# Patient Record
Sex: Male | Born: 1970
Health system: Southern US, Community
[De-identification: ages and names within clinical notes are randomized; demographics above are authoritative.]

## PROBLEM LIST (undated history)

## (undated) DIAGNOSIS — G4700 Insomnia, unspecified: Secondary | ICD-10-CM

## (undated) DIAGNOSIS — J309 Allergic rhinitis, unspecified: Secondary | ICD-10-CM

## (undated) DIAGNOSIS — R030 Elevated blood-pressure reading, without diagnosis of hypertension: Secondary | ICD-10-CM

## (undated) DIAGNOSIS — Z9189 Other specified personal risk factors, not elsewhere classified: Secondary | ICD-10-CM

## (undated) DIAGNOSIS — F32A Depression, unspecified: Secondary | ICD-10-CM

## (undated) DIAGNOSIS — M1990 Unspecified osteoarthritis, unspecified site: Secondary | ICD-10-CM

## (undated) DIAGNOSIS — Z5189 Encounter for other specified aftercare: Secondary | ICD-10-CM

## (undated) DIAGNOSIS — N529 Male erectile dysfunction, unspecified: Secondary | ICD-10-CM

## (undated) DIAGNOSIS — E785 Hyperlipidemia, unspecified: Secondary | ICD-10-CM

## (undated) DIAGNOSIS — R002 Palpitations: Secondary | ICD-10-CM

## (undated) DIAGNOSIS — R200 Anesthesia of skin: Secondary | ICD-10-CM

## (undated) DIAGNOSIS — R202 Paresthesia of skin: Secondary | ICD-10-CM

## (undated) DIAGNOSIS — Z720 Tobacco use: Secondary | ICD-10-CM

## (undated) DIAGNOSIS — F329 Major depressive disorder, single episode, unspecified: Secondary | ICD-10-CM

## (undated) DIAGNOSIS — T7840XA Allergy, unspecified, initial encounter: Secondary | ICD-10-CM

## (undated) HISTORY — DX: Unspecified osteoarthritis, unspecified site: M19.90

## (undated) HISTORY — DX: Morbid (severe) obesity due to excess calories: E66.01

## (undated) HISTORY — DX: Encounter for other specified aftercare: Z51.89

## (undated) HISTORY — DX: Insomnia, unspecified: G47.00

## (undated) HISTORY — DX: Anesthesia of skin: R20.0

## (undated) HISTORY — DX: Allergic rhinitis, unspecified: J30.9

## (undated) HISTORY — PX: OTHER SURGICAL HISTORY: SHX169

## (undated) HISTORY — DX: Other specified personal risk factors, not elsewhere classified: Z91.89

## (undated) HISTORY — PX: CYSTOSCOPY: SUR368

## (undated) HISTORY — DX: Male erectile dysfunction, unspecified: N52.9

## (undated) HISTORY — DX: Elevated blood-pressure reading, without diagnosis of hypertension: R03.0

## (undated) HISTORY — DX: Depression, unspecified: F32.A

## (undated) HISTORY — DX: Allergy, unspecified, initial encounter: T78.40XA

## (undated) HISTORY — DX: Palpitations: R00.2

## (undated) HISTORY — DX: Tobacco use: Z72.0

## (undated) HISTORY — DX: Major depressive disorder, single episode, unspecified: F32.9

## (undated) HISTORY — DX: Paresthesia of skin: R20.2

---
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
## 2015-04-10 ENCOUNTER — Ambulatory Visit (INDEPENDENT_AMBULATORY_CARE_PROVIDER_SITE_OTHER): Payer: 59 | Admitting: Family Medicine

## 2015-04-10 ENCOUNTER — Encounter: Payer: Self-pay | Admitting: Family Medicine

## 2015-04-10 VITALS — BP 141/77 | HR 81 | Temp 98.5°F | Ht 69.0 in | Wt 292.0 lb

## 2015-04-10 DIAGNOSIS — N528 Other male erectile dysfunction: Secondary | ICD-10-CM

## 2015-04-10 DIAGNOSIS — R030 Elevated blood-pressure reading, without diagnosis of hypertension: Secondary | ICD-10-CM | POA: Insufficient documentation

## 2015-04-10 DIAGNOSIS — F32A Depression, unspecified: Secondary | ICD-10-CM

## 2015-04-10 DIAGNOSIS — Z7252 High risk homosexual behavior: Secondary | ICD-10-CM | POA: Insufficient documentation

## 2015-04-10 DIAGNOSIS — Z720 Tobacco use: Secondary | ICD-10-CM

## 2015-04-10 DIAGNOSIS — N529 Male erectile dysfunction, unspecified: Secondary | ICD-10-CM

## 2015-04-10 DIAGNOSIS — F329 Major depressive disorder, single episode, unspecified: Secondary | ICD-10-CM | POA: Diagnosis not present

## 2015-04-10 DIAGNOSIS — Z0000 Encounter for general adult medical examination without abnormal findings: Secondary | ICD-10-CM

## 2015-04-10 DIAGNOSIS — F325 Major depressive disorder, single episode, in full remission: Secondary | ICD-10-CM | POA: Insufficient documentation

## 2015-04-10 DIAGNOSIS — G2581 Restless legs syndrome: Secondary | ICD-10-CM

## 2015-04-10 DIAGNOSIS — IMO0001 Reserved for inherently not codable concepts without codable children: Secondary | ICD-10-CM

## 2015-04-10 DIAGNOSIS — F339 Major depressive disorder, recurrent, unspecified: Secondary | ICD-10-CM | POA: Insufficient documentation

## 2015-04-10 DIAGNOSIS — F17200 Nicotine dependence, unspecified, uncomplicated: Secondary | ICD-10-CM

## 2015-04-10 HISTORY — DX: Morbid (severe) obesity due to excess calories: E66.01

## 2015-04-10 HISTORY — DX: Male erectile dysfunction, unspecified: N52.9

## 2015-04-10 LAB — CBC WITH DIFFERENTIAL/PLATELET
Basophils Absolute: 0 10*3/uL (ref 0.0–0.1)
Basophils Relative: 0 % (ref 0–1)
EOS PCT: 3 % (ref 0–5)
Eosinophils Absolute: 0.3 10*3/uL (ref 0.0–0.7)
HEMATOCRIT: 44.1 % (ref 39.0–52.0)
Hemoglobin: 14.5 g/dL (ref 13.0–17.0)
LYMPHS PCT: 33 % (ref 12–46)
Lymphs Abs: 2.8 10*3/uL (ref 0.7–4.0)
MCH: 29.4 pg (ref 26.0–34.0)
MCHC: 32.9 g/dL (ref 30.0–36.0)
MCV: 89.5 fL (ref 78.0–100.0)
MONOS PCT: 6 % (ref 3–12)
MPV: 11.4 fL (ref 8.6–12.4)
Monocytes Absolute: 0.5 10*3/uL (ref 0.1–1.0)
NEUTROS ABS: 4.9 10*3/uL (ref 1.7–7.7)
Neutrophils Relative %: 58 % (ref 43–77)
PLATELETS: 242 10*3/uL (ref 150–400)
RBC: 4.93 MIL/uL (ref 4.22–5.81)
RDW: 14.4 % (ref 11.5–15.5)
WBC: 8.5 10*3/uL (ref 4.0–10.5)

## 2015-04-10 LAB — COMPREHENSIVE METABOLIC PANEL
ALK PHOS: 85 U/L (ref 39–117)
ALT: 30 U/L (ref 0–53)
AST: 18 U/L (ref 0–37)
Albumin: 3.8 g/dL (ref 3.5–5.2)
BILIRUBIN TOTAL: 0.3 mg/dL (ref 0.2–1.2)
BUN: 10 mg/dL (ref 6–23)
CO2: 25 mEq/L (ref 19–32)
Calcium: 8.8 mg/dL (ref 8.4–10.5)
Chloride: 102 mEq/L (ref 96–112)
Creat: 0.74 mg/dL (ref 0.50–1.35)
GLUCOSE: 136 mg/dL — AB (ref 70–99)
Potassium: 4.1 mEq/L (ref 3.5–5.3)
SODIUM: 140 meq/L (ref 135–145)
TOTAL PROTEIN: 6.9 g/dL (ref 6.0–8.3)

## 2015-04-10 LAB — LIPID PANEL
Cholesterol: 181 mg/dL (ref 0–200)
HDL: 39 mg/dL — ABNORMAL LOW (ref >=40)
LDL CALC: 113 mg/dL — AB (ref 0–99)
Total CHOL/HDL Ratio: 4.6 Ratio
Triglycerides: 147 mg/dL (ref <150)
VLDL: 29 mg/dL (ref 0–40)

## 2015-04-10 LAB — TSH: TSH: 1.614 u[IU]/mL (ref 0.350–4.500)

## 2015-04-10 NOTE — Progress Notes (Signed)
Subjective:     Patient ID: Jonathan Rivas, male   DOB: 06/19/1971, 44 y.o.   MRN: [id number] Written by: Dois Davenport, MS3  HPI Mr. Goerner is a 44 year old male with a past medical history significant for depression who presents today for a new patient visit. He recently moved to [place] from [place], [place] after being laid off at his workplace. 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  SH/FH/PMH/PSH reviewed and updated in EPIC. Reviewed new patient health record. Placed in scan box.   Depression: Diagnosed around 2012. Patient has a history of two suicide attempts in the fall of 2013 and January 2015. He was hospitalized after both attempts. Has been taking 150mg  Effexor for 3 years and reports feeling well. He previously received therapy in [place], but has yet to find a therapist in the area. Patient states that stress around being married to a male while knowing he was gay and transitioning out of that marriage while coming out to his family led to the suicide attempts. Denies SI/HI today. He also reports trouble coping with the death of his brother a few years ago (dies unexpectently from possible narcotic overdose).   Erectile Dysfunction: 1 year history of erectile dysfunction. Associated urinary dribbling, but patient denies issues with stopping/starting flow and straining. Reports 1 nighttime awakening to urinate. PSA 1.5 years ago was normal. There is no family history of prostate pathology. Patient is interested in possible testosterone testing.  Tobacco Abuse: Currently smoking 1/2-3/4 ppd with a 25 year history of use. Has attempted to quit twice, once with Chantix which patient discontinued due to abnormal dreams. Barrier to quitting is that patient enjoys act of going outside to smoke. Motivated to quit in the future by possible health consequences. Denies interested in nicotine gums/patches at this time.   Health Maintenance: CBC, BMP, TSH, lipid panel,  and HIV screening ordered today. Patient has a strong family history of heart disease with death of his father at 67 years due to a heart attack. He currently takes 1x/day aspirin. He denies a history of dyslipidemia or HTN. He walks 3-4 times per week. Does not go any weight training. He is interested in weight loss.   Review of Systems  Constitutional: Negative for fever, chills and fatigue.  Respiratory: Negative for shortness of breath.   Cardiovascular: Negative for chest pain.  Gastrointestinal: Negative for nausea, vomiting and diarrhea.  Psychiatric/Behavioral: Negative for suicidal ideas.   See HPI.     Objective:   Physical Exam BP 141/77 mmHg  Pulse 81  Temp(Src) 98.5 F (36.9 C) (Oral)  Ht 5\' 9"  (1.753 m)  Wt 292 lb (132.45 kg)  BMI 43.10 kg/m2 Gen: well-appearing male in NAD HEENT: Normocephalic, PERRLA, EOMI. Clear TMs bilaterally. Nasal septum midline, no rhinorrhea, MMM with clear oropharynx. No thyromegaly. No submandibular or cervical LAD.  CV: RRR, no MRG, nl S1 and S2, no edema, no heaves/thrills Pulm: lungs CTA bilaterally, no wheezes or crackles Abdomen: obese, normoactive bowel sounds, soft and nontender Neuro: Al5/5 strength in all extremities, 5/5 grip strength  Psych: dressed appropriately, affect is outgoing, mood is stated as good, no tangential thoughts, no flight of ideas, not responding to internal stimuli, no hallucinations, no delusions, No SI, no HI GU: normal circumcised male penis, normal testes bilaterally, no inguinal hernias, prostate exam identified normal sized prostate without mass    Assessment:     Please see Problem List.     Plan:  Please see Problem List.     I personally saw and evaluated the patient. I have reviewed the medical student note and agree with the documentation. I personally completed the physical exam. Additions to the medical student note are made in blue.   Dossie Arbour MD

## 2015-04-10 NOTE — Assessment & Plan Note (Addendum)
Currently controlled on 2mg  ropinirole. Patient denies persistent symptoms that affect sleep.  Jonathan Rivas, MS3  I agree with the medical student documentation -continue Ropinirole  Dossie Arbour MD

## 2015-04-10 NOTE — Assessment & Plan Note (Addendum)
Currently smoking about 1/2 ppd. Not interested in quitting at this time, but patient made aware of available resources.   Jonathan Rivas, MS3  I agree with the medical student documentation. -patient not interested in smoking cessation at this time -reassess at follow up visits

## 2015-04-10 NOTE — Assessment & Plan Note (Signed)
Screening labs including CMP/CBC/Lipid Profile/HIV/TSH ordered.

## 2015-04-10 NOTE — Assessment & Plan Note (Signed)
Systolic blood pressure mildly elevated -recheck of subsequent visits and initiate therapy if remains elevated

## 2015-04-10 NOTE — Assessment & Plan Note (Addendum)
History of depression managed with 150mg  Effexor for the past 3 years. Patient reports feeling well. No SI/HI today.  - continue 150mg  Effexor  Jonathan Rivas, MS3  I agree with the medical student documentation -depression stable on Effexor -continue to monitor  Dossie Arbour MD

## 2015-04-10 NOTE — Assessment & Plan Note (Addendum)
1 year history of trouble maintaining an erection with urinary dribbling. Normal PSA 1.5 years ago. Possibly related to BPH, Effexor use, or anxiety. - PSA today - follow-up   Dois Davenport, MS3  I agree with the medical student assessment and plan. No BPH on exam however will check PSA given urinary symptoms. Effexor may be contributing. Will check labs to rule out metabolic causes.  -if no metabolic issues will discuss start Viagra/Cialis  Dossie Arbour MD

## 2015-04-10 NOTE — Patient Instructions (Addendum)
I was very nice to meet you today.  Dr. Ree Kida will call you or send you a letter with your lab results. Please start walk for at least 30 minutes 3-4 times per week and initiate light weight training.   Please return to have PSA drawn in one week.  Dr. Ree Kida will call you with your PSA results and discuss treatment for Erectile Dysfunction.   Return in 1-2 months to follow up on meed and weight loss. Please call Dr. Ree Kida if you wish to begin smoking cessation.

## 2015-04-11 ENCOUNTER — Telehealth: Payer: Self-pay | Admitting: Family Medicine

## 2015-04-11 LAB — HIV ANTIBODY (ROUTINE TESTING W REFLEX): HIV: NONREACTIVE

## 2015-04-11 NOTE — Telephone Encounter (Signed)
Returned dr Viann Shove phone cal

## 2015-04-11 NOTE — Telephone Encounter (Signed)
Attempted to contact patient to discuss lab results, left message to return call

## 2015-04-11 NOTE — Telephone Encounter (Signed)
Discussed blood work results. All normal except elevated blood glucose. Patient reports eating 2 eggs and grits shortly before appointment. Will recheck blood sugar at next visit while fasting.

## 2015-04-17 ENCOUNTER — Other Ambulatory Visit: Payer: 59

## 2015-04-20 ENCOUNTER — Other Ambulatory Visit: Payer: 59

## 2015-04-24 ENCOUNTER — Ambulatory Visit (INDEPENDENT_AMBULATORY_CARE_PROVIDER_SITE_OTHER): Payer: 59 | Admitting: Internal Medicine

## 2015-04-24 VITALS — BP 126/74 | HR 108 | Temp 99.3°F | Resp 20 | Ht 67.5 in | Wt 282.0 lb

## 2015-04-24 DIAGNOSIS — R509 Fever, unspecified: Secondary | ICD-10-CM | POA: Diagnosis not present

## 2015-04-24 DIAGNOSIS — J039 Acute tonsillitis, unspecified: Secondary | ICD-10-CM

## 2015-04-24 DIAGNOSIS — D72829 Elevated white blood cell count, unspecified: Secondary | ICD-10-CM | POA: Diagnosis not present

## 2015-04-24 LAB — POCT CBC
GRANULOCYTE PERCENT: 84.6 % — AB (ref 37–80)
HEMATOCRIT: 46.3 % (ref 43.5–53.7)
Hemoglobin: 15.1 g/dL (ref 14.1–18.1)
Lymph, poc: 2.9 (ref 0.6–3.4)
MCH, POC: 28.6 pg (ref 27–31.2)
MCHC: 32.6 g/dL (ref 31.8–35.4)
MCV: 87.8 fL (ref 80–97)
MID (cbc): 0.5 (ref 0–0.9)
MPV: 7.9 fL (ref 0–99.8)
POC Granulocyte: 18.4 — AB (ref 2–6.9)
POC LYMPH PERCENT: 13.2 %L (ref 10–50)
POC MID %: 2.2 %M (ref 0–12)
Platelet Count, POC: 230 10*3/uL (ref 142–424)
RBC: 5.28 M/uL (ref 4.69–6.13)
RDW, POC: 14.7 %
WBC: 21.8 10*3/uL — AB (ref 4.6–10.2)

## 2015-04-24 LAB — POCT RAPID STREP A (OFFICE): RAPID STREP A SCREEN: NEGATIVE

## 2015-04-24 MED ORDER — CEFTRIAXONE SODIUM 1 G IJ SOLR
1.0000 g | Freq: Once | INTRAMUSCULAR | Status: AC
  Administered 2015-04-24: 1 g via INTRAMUSCULAR

## 2015-04-24 MED ORDER — HYDROCODONE-ACETAMINOPHEN 7.5-325 MG/15ML PO SOLN: 10.0000 mL | Freq: Four times a day (QID) | ORAL | Status: DC | PRN

## 2015-04-24 MED ORDER — CEFTRIAXONE SODIUM 1 G IJ SOLR: 1.0000 g | Freq: Once | INTRAMUSCULAR | Status: DC

## 2015-04-24 MED ORDER — AMOXICILLIN 500 MG PO CAPS
1000.0000 mg | ORAL_CAPSULE | Freq: Two times a day (BID) | ORAL | Status: DC
Start: 2015-04-24 — End: 2015-09-12

## 2015-04-24 NOTE — Patient Instructions (Signed)
Fever, Adult A fever is a higher than normal body temperature. In an adult, an oral temperature around 98.6 F (37 C) is considered normal. A temperature of 100.4 F (38 C) or higher is generally considered a fever. Mild or moderate fevers generally have no long-term effects and often do not require treatment. Extreme fever (greater than or equal to 106 F or 41.1 C) can cause seizures. The sweating that may occur with repeated or prolonged fever may cause dehydration. Elderly people can develop confusion during a fever. A measured temperature can vary with:  Age.  Time of day.  Method of measurement (mouth, underarm, rectal, or ear). The fever is confirmed by taking a temperature with a thermometer. Temperatures can be taken different ways. Some methods are accurate and some are not.  An oral temperature is used most commonly. Electronic thermometers are fast and accurate.  An ear temperature will only be accurate if the thermometer is positioned as recommended by the manufacturer.  A rectal temperature is accurate and done for those adults who have a condition where an oral temperature cannot be taken.  An underarm (axillary) temperature is not accurate and not recommended. Fever is a symptom, not a disease.  CAUSES   Infections commonly cause fever.  Some noninfectious causes for fever include:  Some arthritis conditions.  Some thyroid or adrenal gland conditions.  Some immune system conditions.  Some types of cancer.  A medicine reaction.  High doses of certain street drugs such as methamphetamine.  Dehydration.  Exposure to high outside or room temperatures.  Occasionally, the source of a fever cannot be determined. This is sometimes called a "fever of unknown origin" (FUO).  Some situations may lead to a temporary rise in body temperature that may go away on its own. Examples are:  Childbirth.  Surgery.  Intense exercise. HOME CARE INSTRUCTIONS   Take  appropriate medicines for fever. Follow dosing instructions carefully. If you use acetaminophen to reduce the fever, be careful to avoid taking other medicines that also contain acetaminophen. Do not take aspirin for a fever if you are younger than age 12. There is an association with Reye's syndrome. Reye's syndrome is a rare but potentially deadly disease.  If an infection is present and antibiotics have been prescribed, take them as directed. Finish them even if you start to feel better.  Rest as needed.  Maintain an adequate fluid intake. To prevent dehydration during an illness with prolonged or recurrent fever, you may need to drink extra fluid.Drink enough fluids to keep your urine clear or pale yellow.  Sponging or bathing with room temperature water may help reduce body temperature. Do not use ice water or alcohol sponge baths.  Dress comfortably, but do not over-bundle. SEEK MEDICAL CARE IF:   You are unable to keep fluids down.  You develop vomiting or diarrhea.  You are not feeling at least partly better after 3 days.  You develop new symptoms or problems. SEEK IMMEDIATE MEDICAL CARE IF:   You have shortness of breath or trouble breathing.  You develop excessive weakness.  You are dizzy or you faint.  You are extremely thirsty or you are making little or no urine.  You develop new pain that was not there before (such as in the head, neck, chest, back, or abdomen).  You have persistent vomiting and diarrhea for more than 1 to 2 days.  You develop a stiff neck or your eyes become sensitive to light.  You develop a  skin rash.  You have a fever or persistent symptoms for more than 2 to 3 days.  You have a fever and your symptoms suddenly get worse. MAKE SURE YOU:   Understand these instructions.  Will watch your condition.  Will get help right away if you are not doing well or get worse. Document Released: 05/06/2001 Document Revised: 03/27/2014 Document  Reviewed: 09/11/2011 [facility] Patient Information 2015 [place], [place]. This information is not intended to replace advice given to you by your health care provider. Make sure you discuss any questions you have with your health care provider. Tonsillitis Tonsillitis is an infection of the throat that causes the tonsils to become red, tender, and swollen. Tonsils are collections of lymphoid tissue at the back of the throat. Each tonsil has crevices (crypts). Tonsils help fight nose and throat infections and keep infection from spreading to other parts of the body for the first 18 months of life.  CAUSES Sudden (acute) tonsillitis is usually caused by infection with streptococcal bacteria. Long-lasting (chronic) tonsillitis occurs when the crypts of the tonsils become filled with pieces of food and bacteria, which makes it easy for the tonsils to become repeatedly infected. SYMPTOMS  Symptoms of tonsillitis include:  A sore throat, with possible difficulty swallowing.  White patches on the tonsils.  Fever.  Tiredness.  New episodes of snoring during sleep, when you did not snore before.  Small, foul-smelling, yellowish-white pieces of material (tonsilloliths) that you occasionally cough up or spit out. The tonsilloliths can also cause you to have bad breath. DIAGNOSIS Tonsillitis can be diagnosed through a physical exam. Diagnosis can be confirmed with the results of lab tests, including a throat culture. TREATMENT  The goals of tonsillitis treatment include the reduction of the severity and duration of symptoms and prevention of associated conditions. Symptoms of tonsillitis can be improved with the use of steroids to reduce the swelling. Tonsillitis caused by bacteria can be treated with antibiotic medicines. Usually, treatment with antibiotic medicines is started before the cause of the tonsillitis is known. However, if it is determined that the cause is not bacterial, antibiotic medicines  will not treat the tonsillitis. If attacks of tonsillitis are severe and frequent, your health care provider may recommend surgery to remove the tonsils (tonsillectomy). HOME CARE INSTRUCTIONS   Rest as much as possible and get plenty of sleep.  Drink plenty of fluids. While the throat is very sore, eat soft foods or liquids, such as sherbet, soups, or instant breakfast drinks.  Eat frozen ice pops.  Gargle with a warm or cold liquid to help soothe the throat. Mix 1/4 teaspoon of salt and 1/4 teaspoon of baking soda in 8 oz of water. SEEK MEDICAL CARE IF:   Large, tender lumps develop in your neck.  A rash develops.  A green, yellow-brown, or bloody substance is coughed up.  You are unable to swallow liquids or food for 24 hours.  You notice that only one of the tonsils is swollen. SEEK IMMEDIATE MEDICAL CARE IF:   You develop any new symptoms such as vomiting, severe headache, stiff neck, chest pain, or trouble breathing or swallowing.  You have severe throat pain along with drooling or voice changes.  You have severe pain, unrelieved with recommended medications.  You are unable to fully open the mouth.  You develop redness, swelling, or severe pain anywhere in the neck.  You have a fever. MAKE SURE YOU:   Understand these instructions.  Will watch your condition.  Will get help right away if you are not doing well or get worse. Document Released: 08/20/2005 Document Revised: 03/27/2014 Document Reviewed: 04/29/2013 [facility] Patient Information 2015 [place], [place]. This information is not intended to replace advice given to you by your health care provider. Make sure you discuss any questions you have with your health care provider.

## 2015-04-24 NOTE — Progress Notes (Signed)
   Subjective:    Patient ID: Jonathan Rivas, male    DOB: [birth year], 44 y.o.   MRN: [id number]  HPI 2 day onset very sore throat and fever. No chest sxs. Had complete CPE few weeks ago before he moved to [place].   Review of Systems  Constitutional: Positive for fever, activity change and fatigue.  HENT: Positive for sore throat and trouble swallowing. Negative for congestion, drooling, rhinorrhea and voice change.   Respiratory: Negative.   Cardiovascular: Negative.   Musculoskeletal: Positive for myalgias.  Skin: Negative.   Psychiatric/Behavioral: Negative.        Objective:   Physical Exam  Constitutional: He is oriented to person, place, and time. He appears well-developed and well-nourished. No distress.  HENT:  Head: Normocephalic.  Right Ear: External ear normal.  Left Ear: External ear normal.  Nose: Nose normal.  Mouth/Throat: Oropharyngeal exudate present.  Eyes: Conjunctivae and EOM are normal. Pupils are equal, round, and reactive to light.  Neck: Normal range of motion. Neck supple.  Cardiovascular: Normal rate.   Pulmonary/Chest: Effort normal.  Lymphadenopathy:    He has cervical adenopathy.  Neurological: He is alert and oriented to person, place, and time. He exhibits normal muscle tone. Coordination normal.  Skin: No rash noted. He is diaphoretic. No erythema.  Psychiatric: He has a normal mood and affect.   Results for orders placed or performed in visit on 04/24/15  POCT CBC  Result Value Ref Range   WBC 21.8 (A) 4.6 - 10.2 K/uL   Lymph, poc 2.9 0.6 - 3.4   POC LYMPH PERCENT 13.2 10 - 50 %L   MID (cbc) 0.5 0 - 0.9   POC MID % 2.2 0 - 12 %M   POC Granulocyte 18.4 (A) 2 - 6.9   Granulocyte percent 84.6 (A) 37 - 80 %G   RBC 5.28 4.69 - 6.13 M/uL   Hemoglobin 15.1 14.1 - 18.1 g/dL   HCT, POC 46.3 43.5 - 53.7 %   MCV 87.8 80 - 97 fL   MCH, POC 28.6 27 - 31.2 pg   MCHC 32.6 31.8 - 35.4 g/dL   RDW, POC 14.7 %   Platelet Count, POC 230 142 -  424 K/uL   MPV 7.9 0 - 99.8 fL  POCT rapid strep A  Result Value Ref Range   Rapid Strep A Screen Negative Negative          Assessment & Plan:  Tonsillitis/Fever Bacterial

## 2015-05-01 ENCOUNTER — Encounter: Payer: Self-pay | Admitting: Family Medicine

## 2015-05-01 ENCOUNTER — Ambulatory Visit (INDEPENDENT_AMBULATORY_CARE_PROVIDER_SITE_OTHER): Payer: 59 | Admitting: Family Medicine

## 2015-05-01 VITALS — BP 124/80 | HR 88 | Temp 98.1°F | Ht 67.5 in | Wt 285.4 lb

## 2015-05-01 DIAGNOSIS — R030 Elevated blood-pressure reading, without diagnosis of hypertension: Secondary | ICD-10-CM

## 2015-05-01 DIAGNOSIS — Z131 Encounter for screening for diabetes mellitus: Secondary | ICD-10-CM | POA: Diagnosis not present

## 2015-05-01 DIAGNOSIS — IMO0001 Reserved for inherently not codable concepts without codable children: Secondary | ICD-10-CM

## 2015-05-01 DIAGNOSIS — N528 Other male erectile dysfunction: Secondary | ICD-10-CM

## 2015-05-01 LAB — GLUCOSE, RANDOM: Glucose, Bld: 79 mg/dL (ref 70–99)

## 2015-05-01 MED ORDER — VENLAFAXINE HCL ER 150 MG PO CP24: 150.0000 mg | ORAL_CAPSULE | Freq: Every day | ORAL | Status: DC

## 2015-05-01 MED ORDER — ROPINIROLE HCL 2 MG PO TABS: 2.0000 mg | ORAL_TABLET | Freq: Every day | ORAL | Status: DC

## 2015-05-01 NOTE — Patient Instructions (Signed)
It was nice to see you today.   I am sorry to hear that your mood is down. I am glad to hear that you havre restarted your medication and have a follow up with behavioral health.  Please return to see me in 2-3 weeks.  I will complete you FMLA paperwork.

## 2015-05-01 NOTE — Assessment & Plan Note (Signed)
Blood pressure controlled today.  -monitor at subsequent visit.

## 2015-05-01 NOTE — Assessment & Plan Note (Signed)
Currently controlled. - refill of ropinirole sent to pharmacy.

## 2015-05-01 NOTE — Progress Notes (Signed)
FMLA paperwork completed and faxed to [place]. Placed in scan box.

## 2015-05-01 NOTE — Assessment & Plan Note (Signed)
Patient currently depressed. PHQ9 score of 12. Recently restarted Venlafaxine. Has upcoming counselor appointment at [facility]. -continue Venlafaxine, refill sent to pharmacy -Return precautions discussed -Follow up in 2-3 weeks

## 2015-05-01 NOTE — Progress Notes (Signed)
   Subjective:    Patient ID: Jonathan Rivas, male    DOB: 1971-07-23, 44 y.o.   MRN: [id number]  HPI 44 y/o male presents for completing of FMLA paperwork.  History of elevated blood pressure - not currently on therapy, no chest pain, no vision changes, no headaches  Depression - has been doing worse over the past month, decreased mood, decreased motivation, skipping work, sleeping more recently, no current SI or HI however has contemplated that he would be "better off not alives", had been off his Venlafaxine due to lack of insurance however restarted 2 weeks ago, has noticed slight improvement of his mood, recent depression exacerbation by 7 year reunion of brother's death, grandmother moving to assisted living, and recent move, feels lonely  Does have an appointment with a counselor at behavioral health on July 6.   Restless Leg Syndrome - stable, needs refill of ropinirole   Review of Systems  Constitutional: Positive for chills. Negative for fever and fatigue.  Respiratory: Negative for cough and shortness of breath.   Cardiovascular: Negative for chest pain.  Psychiatric/Behavioral: Positive for suicidal ideas, hallucinations, sleep disturbance and decreased concentration. Negative for self-injury. The patient is nervous/anxious.        Objective:   Physical Exam Vitals: reviewed Psych: tearful, flat affect, dressed appropriately, no tangential thoughts, no hallucinations, not responding to internal stimuli, no current SI or HI  PHQ 9 score of 12 (extremly difficult functioning)    Assessment & Plan:  Please see problem specific assessment and plan.

## 2015-05-02 ENCOUNTER — Encounter: Payer: Self-pay | Admitting: Family Medicine

## 2015-05-02 LAB — PSA: PSA: 1.13 ng/mL (ref <=4.00)

## 2015-05-07 ENCOUNTER — Telehealth: Payer: Self-pay | Admitting: Family Medicine

## 2015-05-07 NOTE — Telephone Encounter (Signed)
Placed in MDs box to be filled out. Normal Recinos, [name]  

## 2015-05-07 NOTE — Telephone Encounter (Signed)
Note to nursing - I am out of town this week. Please see what papers were placed in my box, I completed FMLA last week and faxed to employer, if FMLA please call the patient and let him know that this was sent to his employer, if not FMLA please respond to this message to let me know what paperwork has been dropped off, thanks

## 2015-05-07 NOTE — Telephone Encounter (Signed)
Patient dropped off papers to be filled out for his employer.  Please fax when completed.

## 2015-05-08 NOTE — Telephone Encounter (Signed)
Patient states that the previous FMLA paperwork that you completed was the wrong forms and these new forms are what his employer needs.

## 2015-05-09 NOTE — Telephone Encounter (Signed)
Spoke with patient and informed him that Dr. Ree Kida was out of the office this week and would return next week to complete paperwork. Paperwork has to be completed by 6/25. Patient expressed understanding.

## 2015-05-09 NOTE — Telephone Encounter (Signed)
Jonathan Rivas is calling to check on the status of this request. Fonda Kinder, ASA

## 2015-05-15 ENCOUNTER — Encounter: Payer: Self-pay | Admitting: Family Medicine

## 2015-05-15 NOTE — Telephone Encounter (Signed)
Completed ADA form. Gave to [place] RN.

## 2015-05-15 NOTE — Telephone Encounter (Signed)
Nursing staff - please call patient and inform him that I have placed a return to work letter in the front office for pickup. Apologize to him that I am unable to email the letter, thanks.

## 2015-05-15 NOTE — Telephone Encounter (Signed)
Also needs letter stating he can return to work on Monday Please email it to : [email or web address]

## 2015-05-16 NOTE — Telephone Encounter (Signed)
Pt informed. Deseree Blount, [name]  

## 2015-05-24 ENCOUNTER — Ambulatory Visit (HOSPITAL_COMMUNITY): Payer: 59 | Admitting: Clinical

## 2015-05-30 ENCOUNTER — Ambulatory Visit (HOSPITAL_COMMUNITY): Payer: 59 | Admitting: Clinical

## 2015-06-21 ENCOUNTER — Other Ambulatory Visit: Payer: Self-pay | Admitting: Family Medicine

## 2015-06-21 MED ORDER — VENLAFAXINE HCL ER 150 MG PO CP24: 150.0000 mg | ORAL_CAPSULE | Freq: Every day | ORAL | Status: DC

## 2015-06-21 NOTE — Telephone Encounter (Signed)
Needs refills on venlafaxine and rOPINIRole sent to [facility] mail order service phone: [phone] fax:[phone]. Thank you, Fonda Kinder, ASA

## 2015-06-21 NOTE — Telephone Encounter (Signed)
Please see message below for patient's pharmacy.  Derl Barrow, RN

## 2015-07-02 ENCOUNTER — Other Ambulatory Visit: Payer: Self-pay | Admitting: *Deleted

## 2015-07-16 ENCOUNTER — Other Ambulatory Visit: Payer: Self-pay | Admitting: *Deleted

## 2015-07-16 MED ORDER — ROPINIROLE HCL 2 MG PO TABS: 2.0000 mg | ORAL_TABLET | Freq: Every day | ORAL | Status: DC

## 2015-07-23 ENCOUNTER — Other Ambulatory Visit: Payer: Self-pay | Admitting: Family Medicine

## 2015-07-23 NOTE — Telephone Encounter (Signed)
Pt called because he also needs his Effexor sent to the new online pharmacy. He is no longer using [facility] so he needs this sent there. jw

## 2015-07-24 NOTE — Telephone Encounter (Signed)
Left message on patient voicemail for him to call back with the name of pharmacy he would like medication sent to.

## 2015-07-25 MED ORDER — VENLAFAXINE HCL ER 150 MG PO CP24: 150.0000 mg | ORAL_CAPSULE | Freq: Every day | ORAL | Status: DC

## 2015-07-25 NOTE — Telephone Encounter (Signed)
Pt calling back, would like it to be sent to [facility] (we already have on file)

## 2015-09-12 ENCOUNTER — Ambulatory Visit (INDEPENDENT_AMBULATORY_CARE_PROVIDER_SITE_OTHER): Payer: 59 | Admitting: Family Medicine

## 2015-09-12 ENCOUNTER — Encounter: Payer: Self-pay | Admitting: Family Medicine

## 2015-09-12 VITALS — BP 140/81 | HR 90 | Temp 98.6°F | Ht 69.0 in | Wt 275.6 lb

## 2015-09-12 DIAGNOSIS — F329 Major depressive disorder, single episode, unspecified: Secondary | ICD-10-CM

## 2015-09-12 DIAGNOSIS — F32A Depression, unspecified: Secondary | ICD-10-CM

## 2015-09-12 MED ORDER — TRAZODONE HCL 50 MG PO TABS: 50.0000 mg | ORAL_TABLET | Freq: Every evening | ORAL | Status: DC | PRN

## 2015-09-12 NOTE — Progress Notes (Signed)
   Subjective:    Patient ID: Jonathan Rivas, male    DOB: 09/10/1971, 44 y.o.   MRN: [id number]  HPI  DEPRESSION Disease Monitoring Current symptoms include difficulty concentrating, fatigue and insomnia     Worst symptom is insomnia      Symptoms have been gradually worsening since had oral surgery about 2 weeks ago    Is Exercising No  Evidence of suicidal ideation: no.     Medication Monitoring Compliance: taking as prescribed Decreased Libido yes     Lightheadedness no     Insomnia yes  GI symptoms no.  Has not see his counselor for about 3 weeks - [place] [facility]?  ROS - See HPI  PMH Previous treatment includes: individual therapy, medication and has been hospitlalized twice in last 3 years for si   Chief Complaint noted Review of Symptoms - see HPI PMH - Smoking status noted.   Vital Signs reviewed    Review of Systems     Objective:   Physical Exam Alert nad Psych:  Cognition and judgment appear intact. Alert, communicative  and cooperative with normal attention span and concentration. No apparent delusions, illusions, hallucinations  PHQ9 = 15       Assessment & Plan:

## 2015-09-12 NOTE — Assessment & Plan Note (Signed)
Worsening with uncertain prognosis Will add trazadone to help with sleep

## 2015-09-12 NOTE — Patient Instructions (Signed)
Good to see you today!  Thanks for coming in.  Try trazadone 50-100 mg at night for sleep  Call us if any worsening of your mood  Make an appointment with your counselor asap  See Dr Ree Kida in 2 weeks

## 2015-09-27 ENCOUNTER — Encounter: Payer: Self-pay | Admitting: Family Medicine

## 2015-09-27 ENCOUNTER — Telehealth: Payer: 59 | Admitting: Physician Assistant

## 2015-09-27 ENCOUNTER — Encounter: Payer: Self-pay | Admitting: Physician Assistant

## 2015-09-27 DIAGNOSIS — J329 Chronic sinusitis, unspecified: Secondary | ICD-10-CM

## 2015-09-27 DIAGNOSIS — B349 Viral infection, unspecified: Secondary | ICD-10-CM

## 2015-09-27 DIAGNOSIS — B9789 Other viral agents as the cause of diseases classified elsewhere: Secondary | ICD-10-CM

## 2015-09-27 MED ORDER — FLUTICASONE PROPIONATE 50 MCG/ACT NA SUSP: 2.0000 | Freq: Every day | NASAL | Status: DC

## 2015-09-27 NOTE — Progress Notes (Signed)
We are sorry that you are not feeling well.  Here is how we plan to help!  Based on what you have shared with me it looks like you have sinusitis.  Sinusitis is inflammation and infection in the sinus cavities of the head.  Based on your presentation I believe you most likely have Acute Viral Sinusitis. This is an infection most likely caused by a virus.  There is not specific treatment for viral sinusitis other than to help you with the symptoms until the infection runs it's course.  You may use an oral decongestant such as Mucinex D or if you have glaucoma or high blood pressure use plain Mucinex.  Saline nasal spray help and can safely be used as often as needed for congestion, I have prescribed fluticason nasal spray. Spray two sprays in each nostril twice a day to help reduce your symptoms.  Some authorities believe that zinc sprays or the use of Echinacea may shorten the course of your symptoms.  Sinus infections are not as easily transmitted as other respiratory infection, however we still recommend that you avoid close contact with loved ones, especially the very young and elderly.  Remember to wash your hands thoroughly throughout the day as this is the number one way to prevent the spread of infection!  Home Care:  Only take medications as instructed by your medical team.  Complete the entire course of an antibiotic.  Do not take these medications with alcohol.  A steam or ultrasonic humidifier can help congestion.  You can place a towel over your head and breathe in the steam from hot water coming from a faucet.  Avoid close contacts especially the very young and the elderly.  Cover your mouth when you cough or sneeze.  Always remember to wash your hands.  Get Help Right Away If:  You develop worsening fever or sinus pain.  You develop a severe head ache or visual changes.  Your symptoms persist after you have completed your treatment plan.  Make sure you  Understand these  instructions.  Will watch your condition.  Will get help right away if you are not doing well or get worse.  Your e-visit answers were reviewed by a board certified advanced clinical practitioner to complete your personal care plan.  Depending on the condition, your plan could have included both over the counter or prescription medications.  If there is a problem please reply  once you have received a response from your provider.  Your safety is important to us.  If you have drug allergies check your prescription carefully.    You can use MyChart to ask questions about today's visit, request a non-urgent call back, or ask for a work or school excuse for 24 hours related to this e-Visit. If it has been greater than 24 hours you will need to follow up with your provider, or enter a new e-Visit to address those concerns.  You will get an e-mail in the next two days asking about your experience.  I hope that your e-visit has been valuable and will speed your recovery. Thank you for using e-visits.    

## 2015-09-29 ENCOUNTER — Other Ambulatory Visit: Payer: Self-pay | Admitting: Family Medicine

## 2015-11-03 ENCOUNTER — Other Ambulatory Visit: Payer: Self-pay | Admitting: Family Medicine

## 2015-12-07 ENCOUNTER — Other Ambulatory Visit: Payer: Self-pay | Admitting: Family Medicine

## 2016-01-18 ENCOUNTER — Telehealth: Payer: 59 | Admitting: Family

## 2016-01-18 DIAGNOSIS — J069 Acute upper respiratory infection, unspecified: Secondary | ICD-10-CM | POA: Diagnosis not present

## 2016-01-18 MED ORDER — BENZONATATE 100 MG PO CAPS: 100.0000 mg | ORAL_CAPSULE | Freq: Three times a day (TID) | ORAL | Status: DC | PRN

## 2016-01-18 NOTE — Progress Notes (Signed)
We are sorry that you are not feeling well.  Here is how we plan to help!  Based on what you have shared with me it looks like you have upper respiratory tract inflammation that has resulted in a significant cough.  Inflammation and infection in the upper respiratory tract is commonly called bronchitis and has four common causes:  Allergies, Viral Infections, Acid Reflux and Bacterial Infections.  Allergies, viruses and acid reflux are treated by controlling symptoms or eliminating the cause. An example might be a cough caused by taking certain blood pressure medications. You stop the cough by changing the medication. Another example might be a cough caused by acid reflux. Controlling the reflux helps control the cough.  Based on your presentation I believe you most likely have A cough due to a virus.  This is called viral bronchitis and is best treated by rest, plenty of fluids and control of the cough.  You may use Ibuprofen or Tylenol as directed to help your symptoms.    In addition you may use A non-prescription cough medication called Mucinex DM: take 2 tablets every 12 hours. and A prescription cough medication called Tessalon Perles 100mg. You may take 1-2 capsules every 8 hours as needed for your cough.    HOME CARE . Only take medications as instructed by your medical team. . Complete the entire course of an antibiotic. . Drink plenty of fluids and get plenty of rest. . Avoid close contacts especially the very young and the elderly . Cover your mouth if you cough or cough into your sleeve. . Always remember to wash your hands . A steam or ultrasonic humidifier can help congestion.    GET HELP RIGHT AWAY IF: . You develop worsening fever. . You become short of breath . You cough up blood. . Your symptoms persist after you have completed your treatment plan MAKE SURE YOU   Understand these instructions.  Will watch your condition.  Will get help right away if you are not doing  well or get worse.  Your e-visit answers were reviewed by a board certified advanced clinical practitioner to complete your personal care plan.  Depending on the condition, your plan could have included both over the counter or prescription medications. If there is a problem please reply  once you have received a response from your provider. Your safety is important to us.  If you have drug allergies check your prescription carefully.    You can use MyChart to ask questions about today's visit, request a non-urgent call back, or ask for a work or school excuse for 24 hours related to this e-Visit. If it has been greater than 24 hours you will need to follow up with your provider, or enter a new e-Visit to address those concerns. You will get an e-mail in the next two days asking about your experience.  I hope that your e-visit has been valuable and will speed your recovery. Thank you for using e-visits.   

## 2016-01-28 ENCOUNTER — Encounter: Payer: Self-pay | Admitting: Family

## 2016-02-07 ENCOUNTER — Encounter: Payer: Self-pay | Admitting: Family Medicine

## 2016-02-08 ENCOUNTER — Ambulatory Visit (INDEPENDENT_AMBULATORY_CARE_PROVIDER_SITE_OTHER): Payer: 59 | Admitting: Family Medicine

## 2016-02-08 ENCOUNTER — Encounter: Payer: Self-pay | Admitting: Family Medicine

## 2016-02-08 VITALS — BP 139/86 | HR 79 | Temp 98.2°F | Wt 286.0 lb

## 2016-02-08 DIAGNOSIS — F32A Depression, unspecified: Secondary | ICD-10-CM

## 2016-02-08 DIAGNOSIS — F329 Major depressive disorder, single episode, unspecified: Secondary | ICD-10-CM | POA: Diagnosis not present

## 2016-02-08 MED ORDER — TRAZODONE HCL 50 MG PO TABS: 50.0000 mg | ORAL_TABLET | Freq: Every evening | ORAL | Status: DC | PRN

## 2016-02-08 NOTE — Patient Instructions (Signed)
Follow up with Dr. Ree Kida in 1 month Sent in trazodone for you. I will fax over the FMLA forms today Will also complete the [employer] forms when we receive them.  Be well, Dr. Ardelia Mems

## 2016-02-11 NOTE — Assessment & Plan Note (Signed)
Recent onset of depressive episode, now improved. No SI/HI. Continue effexor. Will rx trazodone again to help with sleep. Encouraged regular follow up, emphasized thinking of this as any chronic illness. Paperwork for [employer] and short-term disability completed and faxed per patient's request. Will scan copy into chart. Follow up with PCP in 1 month to see how things are going.

## 2016-02-11 NOTE — Progress Notes (Signed)
Date of Visit: 02/08/2016   HPI:  Patient presents to follow up on depression and have forms completed for work.  Patient has a history of depression, treated with effexor. Previously was also on trazodone, but has been out of that recently. Reports on February 17 his brother was in a bad motor vehicle collision. He went to visit his brother in the hospital, and caught his niece's cold while there. Was seen via an e-visit and treated for a sinusitis.   This bout of acute illness brought on a depressive episode. He reports similar episodes in the past, usually occuring after he's been sick. Has been out of work for 3.5 weeks due to the depression. Endorses having had difficulty getting out of bed, feeling down, having no motivation. He sees a Social worker every other week and has continued in therapy during this episode. He is finally feeling better and is now set to go back to work on Monday 3/20. Denies any suicidal ideation or homicidal ideation. Physically is feeling better, and mentally is near normal. He thinks things will continue to improve when he gets back into a regular schedule at work. Admits to not regularly following with physician up for his depression, seen for it about once per year.  Needs forms completed for work, both FMLA form and short-term disability. He works in Engineer, mining at [employer].  ROS: See HPI.  [place]: history of depression, tobacco abuse, restless leg syndrome on ropinirole  PHYSICAL EXAM: BP 139/86 mmHg  Pulse 79  Temp(Src) 98.2 F (36.8 C) (Oral)  Wt 286 lb (129.729 kg) Gen: NAD, pleasant, cooperative HEENT: NCAT Psych: normal range of affect, well groomed, speech normal in rate and volume, normal eye contact. Good insight and judgement.  ASSESSMENT/PLAN:  Depression Recent onset of depressive episode, now improved. No SI/HI. Continue effexor. Will rx trazodone again to help with sleep. Encouraged regular follow up, emphasized thinking of this as any chronic  illness. Paperwork for [employer] and short-term disability completed and faxed per patient's request. Will scan copy into chart. Follow up with PCP in 1 month to see how things are going.   FOLLOW UP: Follow up in 1 month for depression.  [place]. Ardelia Mems, [place] than 25 minutes were spent during this encounter, with at least 50% of the time devoted to counseling and coordination of care.

## 2016-02-14 ENCOUNTER — Encounter: Payer: Self-pay | Admitting: Family Medicine

## 2016-02-14 ENCOUNTER — Ambulatory Visit: Payer: 59 | Admitting: Family Medicine

## 2016-02-19 ENCOUNTER — Telehealth: Payer: Self-pay | Admitting: *Deleted

## 2016-02-19 NOTE — Telephone Encounter (Signed)
Pt dropped off paperwork at his appointment on 02/08/16.  He needs the return to work date to show yesterdays date and to be refaxed. Genie Mirabal, Salome Spotted, [name]

## 2016-02-19 NOTE — Telephone Encounter (Signed)
I will forward this message to Dr. Ardelia Mems since she completed the paperwork initially. I will move the paperwork to her mailbox.

## 2016-02-20 ENCOUNTER — Encounter: Payer: Self-pay | Admitting: Family Medicine

## 2016-02-20 NOTE — Telephone Encounter (Signed)
Called patient about this. He reports that he wasn't totally ready to return to work on 3/20 as we had planned. He now feels fully ready. Denies SI/HI. Needs paperwork done before he will be allowed to return to work.  Advised him of the following: - in the future, if he is unable to return to work as planned after an office visit and form completion, he needs to be seen again. We cannot simply "update the date" of return because the patient reports being unable to go back. - in the future, "urgent" items such as forms for return to work should not be messaged via my chart - as this is by definition to be used for non-urgent questions.   Patient stated his understanding of these issues. Will send in paperwork for both FMLA and short term disability with updated return to work date of 02/25/16.  FYI to PCP  Leeanne Rio, MD

## 2016-02-20 NOTE — Telephone Encounter (Signed)
Pt called again about his work [employer].  He needs to get this done so he can return to work

## 2016-02-20 NOTE — Telephone Encounter (Signed)
Pt called about his paperwork again.  He is unable to return to work until the paperwork is faxed back to his employer.  He is requesting the return to work date of 02-25-16.  Pt would like to be called when the paperwork is faxed

## 2016-02-21 ENCOUNTER — Telehealth: Payer: Self-pay | Admitting: Family Medicine

## 2016-02-21 NOTE — Telephone Encounter (Signed)
This is a medical record request. Please fax my 3/17 office visit note to [facility].The phone number will be listed on the cover sheets from yesterday's fax to [facility].  Thanks, Leeanne Rio, MD

## 2016-02-21 NOTE — Telephone Encounter (Signed)
Pt is calling back because he said that information that was sent today was only the forms. He said that they need office notes that state his condition and why he needs these forms filled . jw

## 2016-02-21 NOTE — Telephone Encounter (Signed)
Pt is calling because Blair Hailey is asking for additional notes from the patients chart that backs up his reason for being out of work. Please fax them to the same number that we sent the forms to. jw

## 2016-02-22 NOTE — Telephone Encounter (Signed)
Re-faxed paperwork with office notes. Deseree Kennon Holter, [name]

## 2016-02-26 DIAGNOSIS — F4321 Adjustment disorder with depressed mood: Secondary | ICD-10-CM | POA: Diagnosis not present

## 2016-03-19 DIAGNOSIS — F4321 Adjustment disorder with depressed mood: Secondary | ICD-10-CM | POA: Diagnosis not present

## 2016-03-21 ENCOUNTER — Encounter: Payer: 59 | Admitting: Family Medicine

## 2016-03-27 ENCOUNTER — Other Ambulatory Visit: Payer: Self-pay | Admitting: Family Medicine

## 2016-03-30 ENCOUNTER — Other Ambulatory Visit: Payer: Self-pay | Admitting: Family Medicine

## 2016-05-22 DIAGNOSIS — F4321 Adjustment disorder with depressed mood: Secondary | ICD-10-CM | POA: Diagnosis not present

## 2016-06-20 ENCOUNTER — Other Ambulatory Visit: Payer: Self-pay | Admitting: Family Medicine

## 2016-07-23 DIAGNOSIS — F4321 Adjustment disorder with depressed mood: Secondary | ICD-10-CM | POA: Diagnosis not present

## 2016-08-01 DIAGNOSIS — F4321 Adjustment disorder with depressed mood: Secondary | ICD-10-CM | POA: Diagnosis not present

## 2016-08-07 DIAGNOSIS — F4321 Adjustment disorder with depressed mood: Secondary | ICD-10-CM | POA: Diagnosis not present

## 2016-08-10 ENCOUNTER — Other Ambulatory Visit: Payer: Self-pay | Admitting: Family Medicine

## 2016-08-15 DIAGNOSIS — H524 Presbyopia: Secondary | ICD-10-CM | POA: Diagnosis not present

## 2016-08-15 DIAGNOSIS — H5203 Hypermetropia, bilateral: Secondary | ICD-10-CM | POA: Diagnosis not present

## 2016-08-15 DIAGNOSIS — H52223 Regular astigmatism, bilateral: Secondary | ICD-10-CM | POA: Diagnosis not present

## 2016-08-27 ENCOUNTER — Other Ambulatory Visit: Payer: Self-pay | Admitting: Family Medicine

## 2016-08-27 NOTE — Progress Notes (Deleted)
   Subjective:    Patient ID: Jonathan Rivas, male    DOB: Jan 05, 1971, 45 y.o.   MRN: [id number]  HPI 45 y/o male presents for follow up of Depression.  Depression Currently on Effexor-ER 150 mg daily. Trazodone for insomnia. Last seen in office in March 2017.   HM Due for Tetanus Vaccine.    Review of Systems     Objective:   Physical Exam There were no vitals taken for this visit.        Assessment & Plan:  No problem-specific Assessment & Plan notes found for this encounter.

## 2016-08-28 ENCOUNTER — Ambulatory Visit: Payer: 59 | Admitting: Family Medicine

## 2016-09-01 ENCOUNTER — Other Ambulatory Visit: Payer: Self-pay | Admitting: Family Medicine

## 2016-09-02 ENCOUNTER — Other Ambulatory Visit: Payer: Self-pay | Admitting: Family Medicine

## 2016-09-04 ENCOUNTER — Telehealth: Payer: Self-pay | Admitting: Family Medicine

## 2016-09-04 MED ORDER — ROPINIROLE HCL 2 MG PO TABS: 2.0000 mg | ORAL_TABLET | Freq: Every day | ORAL | 0 refills | Status: DC

## 2016-09-04 NOTE — Telephone Encounter (Signed)
Pt is calling and would like to ask the doctor to send a 30 day supply of his Requip to a local pharmacy since it will be a while before his mail order arrives and he is out of this medication. Please send this to the [facility] on [email or web address]

## 2016-09-04 NOTE — Telephone Encounter (Signed)
[facility] supply [facility] to [facility] 

## 2016-09-24 ENCOUNTER — Encounter: Payer: Self-pay | Admitting: Family Medicine

## 2016-09-25 ENCOUNTER — Encounter: Payer: Self-pay | Admitting: Family Medicine

## 2016-09-25 ENCOUNTER — Ambulatory Visit (HOSPITAL_COMMUNITY)
Admission: RE | Admit: 2016-09-25 | Discharge: 2016-09-25 | Disposition: A | Discharge status: Home / Self Care | Payer: 59 | Source: Ambulatory Visit | Attending: Family Medicine | Admitting: Family Medicine

## 2016-09-25 ENCOUNTER — Ambulatory Visit (INDEPENDENT_AMBULATORY_CARE_PROVIDER_SITE_OTHER): Payer: 59 | Admitting: Family Medicine

## 2016-09-25 VITALS — BP 124/78 | HR 83 | Temp 98.7°F | Ht 69.0 in | Wt 300.0 lb

## 2016-09-25 DIAGNOSIS — F4321 Adjustment disorder with depressed mood: Secondary | ICD-10-CM | POA: Diagnosis not present

## 2016-09-25 DIAGNOSIS — R002 Palpitations: Secondary | ICD-10-CM

## 2016-09-25 DIAGNOSIS — Z0000 Encounter for general adult medical examination without abnormal findings: Secondary | ICD-10-CM | POA: Diagnosis not present

## 2016-09-25 DIAGNOSIS — Z23 Encounter for immunization: Secondary | ICD-10-CM | POA: Diagnosis not present

## 2016-09-25 DIAGNOSIS — R030 Elevated blood-pressure reading, without diagnosis of hypertension: Secondary | ICD-10-CM

## 2016-09-25 DIAGNOSIS — R200 Anesthesia of skin: Secondary | ICD-10-CM | POA: Diagnosis not present

## 2016-09-25 DIAGNOSIS — F331 Major depressive disorder, recurrent, moderate: Secondary | ICD-10-CM

## 2016-09-25 DIAGNOSIS — F17200 Nicotine dependence, unspecified, uncomplicated: Secondary | ICD-10-CM

## 2016-09-25 DIAGNOSIS — R202 Paresthesia of skin: Secondary | ICD-10-CM

## 2016-09-25 HISTORY — DX: Paresthesia of skin: R20.0

## 2016-09-25 HISTORY — DX: Paresthesia of skin: R20.2

## 2016-09-25 HISTORY — DX: Palpitations: R00.2

## 2016-09-25 LAB — CBC
HCT: 45 % (ref 38.5–50.0)
Hemoglobin: 14.8 g/dL (ref 13.2–17.1)
MCH: 30.1 pg (ref 27.0–33.0)
MCHC: 32.9 g/dL (ref 32.0–36.0)
MCV: 91.5 fL (ref 80.0–100.0)
MPV: 10.7 fL (ref 7.5–12.5)
PLATELETS: 263 10*3/uL (ref 140–400)
RBC: 4.92 MIL/uL (ref 4.20–5.80)
RDW: 14 % (ref 11.0–15.0)
WBC: 11 10*3/uL — AB (ref 3.8–10.8)

## 2016-09-25 LAB — TSH: TSH: 0.78 m[IU]/L (ref 0.40–4.50)

## 2016-09-25 LAB — POCT GLYCOSYLATED HEMOGLOBIN (HGB A1C): Hemoglobin A1C: 5.8

## 2016-09-25 LAB — FOLATE: Folate: >24 ng/mL (ref >5.4)

## 2016-09-25 LAB — VITAMIN B12: Vitamin B-12: 518 pg/mL (ref 200–1100)

## 2016-09-25 MED ORDER — NICOTINE POLACRILEX 4 MG MT GUM: 4.0000 mg | CHEWING_GUM | OROMUCOSAL | 0 refills | Status: DC | PRN

## 2016-09-25 NOTE — Progress Notes (Deleted)
45 y.o. year old male presents for preventative visit and annual physical.   Acute Concerns: Depression - currently on Effexor XR 150 mg daily and Trazodone 50 mg prn insomnia  Diet:  Exercise:  Sexual History:  POA/Living Will:  Social:  Social History   Social History  . Marital status: Single    Spouse name: N/A  . Number of children: N/A  . Years of education: N/A   Social History Main Topics  . Smoking status: Current Every Day Smoker    Packs/day: 0.50    Years: 25.00    Types: Cigarettes  . Smokeless tobacco: Not on file  . Alcohol use 1.2 oz/week    2 Glasses of wine per week  . Drug use: No  . Sexual activity: Yes     Comment: prefers male partners   Other Topics Concern  . Not on file   Social History Narrative   Patient is Homosexual. Previous wife for 20 years.     Immunization: Immunization History  Administered Date(s) Administered  . Influenza,inj,Quad PF,36+ Mos 07/22/2015, 08/15/2016  Due for Tetanus  Cancer Screening:  Colonoscopy:   Prostate: History of ED, DRE normal one year ago; PSA 1.13 in 04/2015     Physical Exam: VITALS: Reviewed GEN: Pleasant male, NAD HEENT: Normocephalic, PERRL, EOMI, no scleral icterus, bilateral TM pearly grey, nasal septum midline, MMM, uvula midline, no anterior or posterior lymphadenopathy, no thyromegaly CARDIAC:RRR, S1 and S2 present, no murmur, no heaves/thrills RESP: CTAB, normal effort ABD: soft, no tenderness, normal bowel sounds  EXT: No edema, 2+ radial and DP pulses SKIN: no rash  ASSESSMENT & PLAN: 45 y.o. male presents for annual preventative exam. Please see problem specific assessment and plan.   No problem-specific Assessment & Plan notes found for this encounter.

## 2016-09-25 NOTE — Assessment & Plan Note (Signed)
Discussed lifestyle changes. -referral to Nutrition -Patient to complete cardiology referral prior to increasing current workout regimen

## 2016-09-25 NOTE — Assessment & Plan Note (Signed)
Tetanus vaccine given today. 

## 2016-09-25 NOTE — Assessment & Plan Note (Signed)
Intermittent palpitations with chest pressure. Associated with exertion. EKG was unremarkable. Patient currently asymptomatic. -referral to cardiology for further evaluation.

## 2016-09-25 NOTE — Assessment & Plan Note (Addendum)
Likely due to positioning as resolves with movement. Exam unremarkable today.  -check for diabetes, check B12/folate/TSH

## 2016-09-25 NOTE — Assessment & Plan Note (Signed)
Well controlled on Effexor and Trazodone. PHQ 9 score of 6. GAD 7 score of 5.

## 2016-09-25 NOTE — Assessment & Plan Note (Signed)
Blood pressure in normal range today on recheck.  Patient to check blood pressures at home and send to physician.

## 2016-09-25 NOTE — Assessment & Plan Note (Signed)
Patient interested in nicotine replacement.  Will send nicotine gum to pharmacy.

## 2016-09-25 NOTE — Progress Notes (Signed)
   Subjective:    Patient ID: Jonathan Rivas, male    DOB: [birth year], 45 y.o.   MRN: [id number]  HPI 45 y/o male presents with multiple acute complaints.  Weight gain Walks several times per week (3 nights per week, 2 miles at a time), bowls 2 nights per week, continues to gain weight; Breakfast - yougurt; Lunch - sandwich with pretzels; dinner - chicken + vegetable + rice   Palpitations Heart pumping hard, feel "like standing in the wind", happening on daily basis, followed by headache (lasts an hour), has been occurring over the past few months, does have shortness of breath (mostly with overexertion, but does occur with casual walking). These symptoms are mostly with exertion but can occur at rest. Does have some associated chest tightness.   Tingling in feet and hands Patient is concerned about diabetes, does not happen at same time as above symptoms, occurs with daily activities (such as lifting up tablet), more prevalent in hands, resolves with moving hands  Depression Improved from previous, sleep improved with Trazodone (taking 2 tablets nightly), sleeping 7 hours per night, also on Effexor 150 mg daily, no SI, no HI, increased interest in activities, in 2 bowling leagues  Social - in two [place], smokes 1 pack of cigarettes per day   HM Due for Tdap   Review of Systems Postitive for a number of symptoms: shortness of breast, fatigue, lightheadedness , associated "hearing heart beat in ears", increased sweating    Objective:   Physical Exam BP 124/78   Pulse 83   Temp 98.7 F (37.1 C) (Oral)   Ht 5\' 9"  (1.753 m)   Wt 300 lb (136.1 kg)   BMI 44.30 kg/m   Gen: pleasant male, NAD Cardiac: RRR, S1 and S2 present, no murmur Resp: CTAB, normal effort Ext: no edema MSK: no cervical tenderness, cervical ROM is full Neuro: CN 2-12 intact, strength 5/5 in all extremities, sensation to light touch intact in all extremities, neurofilament testing intact in all  extremities  EKG: NSR, LAD, no ischemic changes  PHQ 9 score of 6 (not difficult) GAD 7 score of 5 (not difficult)     Assessment & Plan:  Preventative health care Tetanus vaccine given today.   Depression Well controlled on Effexor and Trazodone. PHQ 9 score of 6. GAD 7 score of 5.   Elevated blood pressure reading Blood pressure in normal range today on recheck.  Patient to check blood pressures at home and send to physician.   Numbness and tingling in both hands Likely due to positioning as resolves with movement. Exam unremarkable today.  -check for diabetes, check B12/folate/TSH  Morbid obesity Discussed lifestyle changes. -referral to Nutrition -Patient to complete cardiology referral prior to increasing current workout regimen  Palpitations Intermittent palpitations with chest pressure. Associated with exertion. EKG was unremarkable. Patient currently asymptomatic. -referral to cardiology for further evaluation.   Tobacco use disorder Patient interested in nicotine replacement.  Will send nicotine gum to pharmacy.

## 2016-09-25 NOTE — Patient Instructions (Signed)
It was nice to see you today.   Dr. Ree Kida will call you with your lab results.

## 2016-09-26 LAB — COMPLETE METABOLIC PANEL WITH GFR
ALT: 39 U/L (ref 9–46)
AST: 28 U/L (ref 10–40)
Albumin: 4.3 g/dL (ref 3.6–5.1)
Alkaline Phosphatase: 87 U/L (ref 40–115)
BILIRUBIN TOTAL: 0.4 mg/dL (ref 0.2–1.2)
BUN: 9 mg/dL (ref 7–25)
CALCIUM: 9.4 mg/dL (ref 8.6–10.3)
CHLORIDE: 105 mmol/L (ref 98–110)
CO2: 25 mmol/L (ref 20–31)
Creat: 0.74 mg/dL (ref 0.60–1.35)
Glucose, Bld: 76 mg/dL (ref 65–99)
Potassium: 4.4 mmol/L (ref 3.5–5.3)
Sodium: 141 mmol/L (ref 135–146)
TOTAL PROTEIN: 7.5 g/dL (ref 6.1–8.1)

## 2016-09-26 LAB — LIPID PANEL
CHOL/HDL RATIO: 4.1 ratio (ref <=5.0)
CHOLESTEROL: 190 mg/dL (ref 125–200)
HDL: 46 mg/dL (ref >=40)
LDL Cholesterol: 122 mg/dL (ref <130)
TRIGLYCERIDES: 111 mg/dL (ref <150)
VLDL: 22 mg/dL (ref <30)

## 2016-09-29 ENCOUNTER — Telehealth: Payer: Self-pay | Admitting: Family Medicine

## 2016-09-29 NOTE — Telephone Encounter (Signed)
Called and informed patient of normal lipid, cmp, cbc, vitamin levels, tsh

## 2016-09-30 NOTE — Progress Notes (Signed)
Cardiology Office Note  NEW PATIENT  Date:  10/01/2016   ID:  Jonathan Rivas, DOB Jul 18, 1971, MRN [id number]  PCP:  Lupita Dawn, MD  Cardiologist:  New  Dr. Marlou Porch  Chief Complaint  Patient presents with  . Palpitations      History of Present Illness: Jonathan Rivas is a 45 y.o. male who presents for palpitations. He is referred by Dr. Ree Kida his PCP. Pt has strong and fast Heart beat that he hears in his ears when he stands then he becomes lightheaded and develops a headache thereafter.  He also complains of DOE with walking at first it was with strenuous activity and at times chest pressure, resolves with rest, now occurs more frequently.  Walking up hill he became so SOB that he felt he would pass out.  He is concerned in part due to premature family hx of CAD.   He does smoke 1 ppd and has since age 33, has tried to stop using Chantix which caused nightmares and cold [place] stopping which did not work.  We talked about importance of stopping.       He also relates increase of sweating separate from other complaints. Very little and he sweats.  He believes he snores but lives alone and is not sure.   Recent labs with LDL 122. HDL 46, EKG from 09/25/16 SR normal EKG.     Past Medical History:  Diagnosis Date  . Depression   . Erectile dysfunction   . Obesity, morbid, BMI 40.0-49.9 ([place])   . Tobacco use     No past surgical history on file.  No past surgical hx.   Current Outpatient Prescriptions  Medication Sig Dispense Refill  . aspirin 81 MG tablet Take 81 mg by mouth daily.    . Multiple Vitamin (MULTIVITAMIN) capsule Take 1 capsule by mouth daily.    . nicotine polacrilex ([employer] NICOTINE POLACRILEX) 4 MG gum Take 1 each (4 mg total) by mouth as needed for smoking cessation. 100 tablet 0  . rOPINIRole (REQUIP) 2 MG tablet TAKE 1 TABLET (2 MG TOTAL) BY MOUTH AT BEDTIME. 90 tablet 1  . traZODone (DESYREL) 50 MG tablet TAKE 1-2 TABLETS BY MOUTH  AT BEDTIME AS  NEEDED FOR  SLEEP 60 tablet 2  . venlafaxine XR (EFFEXOR-XR) 150 MG 24 hr capsule Take 1 capsule by mouth  daily with breakfast 90 capsule 1   No current facility-administered medications for this visit.     Allergies:   Patient has no known allergies.    Social History:  The patient  reports that he has been smoking Cigarettes.  He has a 12.50 pack-year smoking history. He does not have any smokeless tobacco history on file. He reports that he drinks about 1.2 oz of alcohol per week . He reports that he does not use drugs.   Family History:  The patient's family history includes Alcohol abuse in his brother and maternal aunt; Asthma in his maternal aunt; Cancer in his mother; Depression in his brother; Drug abuse in his brother; Heart disease in his father, paternal grandfather, and paternal uncle.    ROS:  General:no colds or fevers, no weight changes- has tried to lose wt without success.  Skin:no rashes or ulcers HEENT:no blurred vision, no congestion CV:see HPI PUL:see HPI- smokes 1 ppd and has tried to stop with Chantix and cold [place], is going to use nicoderm gum now.  GI:no diarrhea constipation or melena, no indigestion GU:+ chronic  microscopic hematuria has been evaluated by urology and idiopathic, no dysuria MS:Rt knee pain, no claudication Neuro:no syncope, no lightheadedness ( on BP meds at one point and had syncope and with EMS was hypotensive.) Endo:no diabetes, no thyroid disease  Wt Readings from Last 3 Encounters:  10/01/16 299 lb 8 oz (135.9 kg)  09/25/16 300 lb (136.1 kg)  02/08/16 286 lb (129.7 kg)     PHYSICAL EXAM: VS:  BP 132/84 (BP Location: Left Arm, Patient Position: Sitting, Cuff Size: Large)   Pulse 87   Ht 5\' 9"  (1.753 m)   Wt 299 lb 8 oz (135.9 kg)   BMI 44.23 kg/m  , BMI Body mass index is 44.23 kg/m. General:Pleasant affect, NAD Skin:Warm and dry, brisk capillary refill HEENT:normocephalic, sclera clear, mucus membranes moist Neck:supple, no  JVD, no bruits  Heart:S1S2 RRR without murmur, gallup, rub or click Lungs:clear without rales, rhonchi, or wheezes [id number], non tender, + BS, do not palpate liver spleen or masses Ext:no lower ext edema, 3+ pedal pulses, 2+ radial pulses Neuro:alert and oriented, MAE, follows commands, + facial symmetry    EKG:  EKG is NOT ordered today. The ekg 09/25/16 reviewed see above.  Recent Labs: 09/25/2016: ALT 39; BUN 9; Creat 0.74; Hemoglobin 14.8; Platelets 263; Potassium 4.4; Sodium 141; TSH 0.78    Lipid Panel    Component Value Date/Time   CHOL 190 09/25/2016 1443   TRIG 111 09/25/2016 1443   HDL 46 09/25/2016 1443   CHOLHDL 4.1 09/25/2016 1443   VLDL 22 09/25/2016 1443   LDLCALC 122 09/25/2016 1443       Other studies Reviewed: Additional studies/ records that were reviewed today include: previous notes.   ASSESSMENT AND PLAN:  1.  DOE with chest pressure- with risk factors of Premature FH CAD, +tobacco use, and obesity.  This may be angina and with his morbid obesity not sure the nuc study would give info we need.  Discussed with Dr. Marlou Porch who has seen him as well and plan will be for cardiac cath to know if indeed this is due to CAD.    We discussed with Pt and he is agreeable to proceed this week with cath.  Aware of if needed stent to coronary artery that would require overnight stay.    The patient understands that risks included but are not limited to stroke (1 in 1000), death (1 in 18), kidney failure [usually temporary] (1 in 500), bleeding (1 in 200), allergic reaction [possibly serious] (1 in 200).   2.  Rapid forceful heart rate. With associated lightheadedness- recent TSH and lytes are normal.  Will check 24 hour holter.   3. Morbid obesity, he exercises, though currently difficult- he may need wt loss management system  4. Diaphoresis, possibly due to sleep apnea.  We may schedule sleep study once cardiac work up complete.  5. Tobacco use discussed  cessation.   Pt will be using nicoderm gum.   Pt will follow up after cath.  Current medicines are reviewed with the patient today.  The patient Has no concerns regarding medicines.  The following changes have been made:  See above Labs/ tests ordered today include:see above  Disposition:   FU:  see above  Signed, Cecilie Kicks, NP  10/01/2016 11:33 AM    [facility], [place], [place], [place] Phone: [phone]; Fax: [phone]  Personally seen and examined. Agree  with above. Given his multiple cardiac risk factors, obesity which would decrease his overall sensitivity for stress testing, I do believe that his anginal symptoms should be evaluated with cardiac catheterization. He is willing to proceed. Excellent radial pulse. Heart is regular rate and rhythm.  Candee Furbish, MD

## 2016-10-01 ENCOUNTER — Encounter: Payer: Self-pay | Admitting: Cardiology

## 2016-10-01 ENCOUNTER — Encounter: Payer: Self-pay | Admitting: *Deleted

## 2016-10-01 ENCOUNTER — Ambulatory Visit (INDEPENDENT_AMBULATORY_CARE_PROVIDER_SITE_OTHER): Payer: 59 | Admitting: Cardiology

## 2016-10-01 ENCOUNTER — Other Ambulatory Visit: Payer: Self-pay | Admitting: Family Medicine

## 2016-10-01 VITALS — BP 132/84 | HR 87 | Ht 69.0 in | Wt 299.5 lb

## 2016-10-01 DIAGNOSIS — R0789 Other chest pain: Secondary | ICD-10-CM

## 2016-10-01 DIAGNOSIS — R0609 Other forms of dyspnea: Secondary | ICD-10-CM

## 2016-10-01 DIAGNOSIS — Z720 Tobacco use: Secondary | ICD-10-CM

## 2016-10-01 DIAGNOSIS — R000 Tachycardia, unspecified: Secondary | ICD-10-CM

## 2016-10-01 DIAGNOSIS — R0600 Dyspnea, unspecified: Secondary | ICD-10-CM

## 2016-10-01 LAB — CBC WITH DIFFERENTIAL/PLATELET
BASOS ABS: 0 {cells}/uL (ref 0–200)
Basophils Relative: 0 %
EOS PCT: 2 %
Eosinophils Absolute: 242 cells/uL (ref 15–500)
HEMATOCRIT: 48.1 % (ref 38.5–50.0)
HEMOGLOBIN: 16 g/dL (ref 13.2–17.1)
LYMPHS ABS: 4235 {cells}/uL — AB (ref 850–3900)
Lymphocytes Relative: 35 %
MCH: 29.9 pg (ref 27.0–33.0)
MCHC: 33.3 g/dL (ref 32.0–36.0)
MCV: 89.7 fL (ref 80.0–100.0)
MONO ABS: 847 {cells}/uL (ref 200–950)
MPV: 10.9 fL (ref 7.5–12.5)
Monocytes Relative: 7 %
NEUTROS ABS: 6776 {cells}/uL (ref 1500–7800)
Neutrophils Relative %: 56 %
Platelets: 286 10*3/uL (ref 140–400)
RBC: 5.36 MIL/uL (ref 4.20–5.80)
RDW: 14 % (ref 11.0–15.0)
WBC: 12.1 10*3/uL — ABNORMAL HIGH (ref 3.8–10.8)

## 2016-10-01 LAB — BASIC METABOLIC PANEL
BUN: 14 mg/dL (ref 7–25)
CALCIUM: 9.9 mg/dL (ref 8.6–10.3)
CO2: 26 mmol/L (ref 20–31)
Chloride: 102 mmol/L (ref 98–110)
Creat: 0.81 mg/dL (ref 0.60–1.35)
GLUCOSE: 85 mg/dL (ref 65–99)
POTASSIUM: 4.6 mmol/L (ref 3.5–5.3)
SODIUM: 138 mmol/L (ref 135–146)

## 2016-10-01 LAB — PROTIME-INR
INR: 1
PROTHROMBIN TIME: 10.9 s (ref 9.0–11.5)

## 2016-10-01 NOTE — Patient Instructions (Addendum)
Medication Instructions:  Your physician recommends that you continue on your current medications as directed. Please refer to the Current Medication list given to you today.   Labwork: TODAY:  BMET, CBC W/DIFF, & PT/INR  Testing/Procedures:  Your physician has requested that you have a cardiac catheterization. Cardiac catheterization is used to diagnose and/or treat various heart conditions. Doctors may recommend this procedure for a number of different reasons. The most common reason is to evaluate chest pain. Chest pain can be a symptom of coronary artery disease (CAD), and cardiac catheterization can show whether plaque is narrowing or blocking your heart's arteries. This procedure is also used to evaluate the valves, as well as measure the blood flow and oxygen levels in different parts of your heart. For further information please visit [email or web address]. Please follow instruction sheet, as given.    Your physician has recommended that you wear a holter monitor. Holter monitors are medical devices that record the heart's electrical activity. Doctors most often use these monitors to diagnose arrhythmias. Arrhythmias are problems with the speed or rhythm of the heartbeat. The monitor is a small, portable device. You can wear one while you do your normal daily activities. This is usually used to diagnose what is causing palpitations/syncope (passing out).   Follow-Up: Your physician recommends that you schedule a follow-up appointment in: WILL BE SET UP AT DISCHARGE   Any Other Special Instructions Will Be Listed Below (If Applicable).   Coronary Angiogram A coronary angiogram, also called coronary angiography, is an X-ray procedure used to look at the arteries in the heart. In this procedure, a dye (contrast dye) is injected through a long, hollow tube (catheter). The catheter is about the size of a piece of cooked spaghetti and is inserted through your groin, wrist, or arm. The dye is  injected into each artery, and X-rays are then taken to show if there is a blockage in the arteries of your heart. LET [facility] CARE PROVIDER KNOW ABOUT:  Any allergies you have, including allergies to shellfish or contrast dye.   All medicines you are taking, including vitamins, herbs, eye drops, creams, and over-the-counter medicines.   Previous problems you or members of your family have had with the use of anesthetics.   Any blood disorders you have.   Previous surgeries you have had.  History of kidney problems or failure.   Other medical conditions you have. RISKS AND COMPLICATIONS  Generally, a coronary angiogram is a safe procedure. However, problems can occur and include:  Allergic reaction to the dye.  Bleeding from the access site or other locations.  Kidney injury, especially in people with impaired kidney function.  Stroke (rare).  Heart attack (rare). BEFORE THE PROCEDURE   Do not eat or drink anything after midnight the night before the procedure or as directed by your health care provider.   Ask your health care provider about changing or stopping your regular medicines. This is especially important if you are taking diabetes medicines or blood thinners. PROCEDURE  You may be given a medicine to help you relax (sedative) before the procedure. This medicine is given through an intravenous (IV) access tube that is inserted into one of your veins.   The area where the catheter will be inserted will be washed and shaved. This is usually done in the groin but may be done in the fold of your arm (near your elbow) or in the wrist.   A medicine will be given to numb the area  where the catheter will be inserted (local anesthetic).   The health care provider will insert the catheter into an artery. The catheter will be guided by using a special type of X-ray (fluoroscopy) of the blood vessel being examined.   A special dye will then be injected into the  catheter, and X-rays will be taken. The dye will help to show where any narrowing or blockages are located in the heart arteries.  AFTER THE PROCEDURE   If the procedure is done through the leg, you will be kept in bed lying flat for several hours. You will be instructed to not bend or cross your legs.  The insertion site will be checked frequently.   The pulse in your feet or wrist will be checked frequently.   Additional blood tests, X-rays, and an electrocardiogram may be done.    This information is not intended to replace advice given to you by your health care provider. Make sure you discuss any questions you have with your health care provider.   Document Released: 05/17/2003 Document Revised: 12/01/2014 Document Reviewed: 04/04/2013 Elsevier Interactive Patient Education 2016 Elsevier Inc.   Holter Monitoring A Holter monitor is a small device that is used to detect abnormal heart rhythms. It clips to your clothing and is connected by wires to flat, sticky disks (electrodes) that attach to your chest. It is worn continuously for 24-48 hours. HOME CARE INSTRUCTIONS  Wear your Holter monitor at all times, even while exercising and sleeping, for as long as directed by your health care provider.  Make sure that the Holter monitor is safely clipped to your clothing or close to your body as recommended by your health care provider.  Do not get the monitor or wires wet.  Do not put body lotion or moisturizer on your chest.  Keep your skin clean.  Keep a diary of your daily activities, such as walking and doing chores. If you feel that your heartbeat is abnormal or that your heart is fluttering or skipping a beat:  Record what you are doing when it happens.  Record what time of day the symptoms occur.  Return your Holter monitor as directed by your health care provider.  Keep all follow-up visits as directed by your health care provider. This is important. SEEK IMMEDIATE  MEDICAL CARE IF:  You feel lightheaded or you faint.  You have trouble breathing.  You feel pain in your chest, upper arm, or jaw.  You feel sick to your stomach and your skin is pale, cool, or damp.  You heartbeat feels unusual or abnormal.   This information is not intended to replace advice given to you by your health care provider. Make sure you discuss any questions you have with your health care provider.   Document Released: 08/08/2004 Document Revised: 12/01/2014 Document Reviewed: 06/19/2014 Elsevier Interactive Patient Education [employer].   If you need a refill on your cardiac medications before your next appointment, please call your pharmacy.

## 2016-10-03 ENCOUNTER — Ambulatory Visit (HOSPITAL_COMMUNITY)
Admission: RE | Admit: 2016-10-03 | Discharge: 2016-10-03 | Disposition: A | Discharge status: Home / Self Care | Payer: 59 | Source: Ambulatory Visit | Attending: Cardiovascular Disease | Admitting: Cardiovascular Disease

## 2016-10-03 ENCOUNTER — Encounter (HOSPITAL_COMMUNITY): Payer: Self-pay | Admitting: Cardiovascular Disease

## 2016-10-03 ENCOUNTER — Encounter (HOSPITAL_COMMUNITY)
Admission: RE | Disposition: A | Discharge status: Home / Self Care | Payer: Self-pay | Source: Ambulatory Visit | Attending: Cardiovascular Disease

## 2016-10-03 DIAGNOSIS — Z6841 Body Mass Index (BMI) 40.0 and over, adult: Secondary | ICD-10-CM | POA: Diagnosis not present

## 2016-10-03 DIAGNOSIS — R0789 Other chest pain: Secondary | ICD-10-CM | POA: Diagnosis not present

## 2016-10-03 DIAGNOSIS — F17210 Nicotine dependence, cigarettes, uncomplicated: Secondary | ICD-10-CM | POA: Diagnosis not present

## 2016-10-03 DIAGNOSIS — R002 Palpitations: Secondary | ICD-10-CM | POA: Diagnosis not present

## 2016-10-03 DIAGNOSIS — R0609 Other forms of dyspnea: Secondary | ICD-10-CM | POA: Diagnosis not present

## 2016-10-03 DIAGNOSIS — Z7982 Long term (current) use of aspirin: Secondary | ICD-10-CM | POA: Insufficient documentation

## 2016-10-03 DIAGNOSIS — Z8249 Family history of ischemic heart disease and other diseases of the circulatory system: Secondary | ICD-10-CM | POA: Diagnosis not present

## 2016-10-03 DIAGNOSIS — Z79899 Other long term (current) drug therapy: Secondary | ICD-10-CM | POA: Insufficient documentation

## 2016-10-03 HISTORY — PX: CARDIAC CATHETERIZATION: SHX172

## 2016-10-03 SURGERY — LEFT HEART CATH AND CORONARY ANGIOGRAPHY
Anesthesia: LOCAL

## 2016-10-03 MED ORDER — MIDAZOLAM HCL 2 MG/2ML IJ SOLN
INTRAMUSCULAR | Status: AC
  Filled 2016-10-03: qty 2

## 2016-10-03 MED ORDER — HEPARIN (PORCINE) IN NACL 2-0.9 UNIT/ML-% IJ SOLN
INTRAMUSCULAR | Status: AC
  Filled 2016-10-03: qty 1500

## 2016-10-03 MED ORDER — IOPAMIDOL (ISOVUE-370) INJECTION 76%
INTRAVENOUS | Status: AC
  Filled 2016-10-03: qty 100

## 2016-10-03 MED ORDER — LIDOCAINE HCL (PF) 1 % IJ SOLN
INTRAMUSCULAR | Status: DC | PRN
  Administered 2016-10-03: 15 mL
  Administered 2016-10-03: 2 mL

## 2016-10-03 MED ORDER — VERAPAMIL HCL 2.5 MG/ML IV SOLN
INTRAVENOUS | Status: AC
  Filled 2016-10-03: qty 2

## 2016-10-03 MED ORDER — HEPARIN (PORCINE) IN NACL 2-0.9 UNIT/ML-% IJ SOLN
INTRAMUSCULAR | Status: DC | PRN
Start: 2016-10-03 — End: 2016-10-03
  Administered 2016-10-03: 1500 mL

## 2016-10-03 MED ORDER — DIAZEPAM 5 MG PO TABS: 5.0000 mg | ORAL_TABLET | ORAL | Status: DC | PRN

## 2016-10-03 MED ORDER — SODIUM CHLORIDE 0.9 % IV SOLN: 250.0000 mL | INTRAVENOUS | Status: DC | PRN

## 2016-10-03 MED ORDER — MIDAZOLAM HCL 2 MG/2ML IJ SOLN
INTRAMUSCULAR | Status: DC | PRN
  Administered 2016-10-03: 1 mg via INTRAVENOUS
  Administered 2016-10-03: 2 mg via INTRAVENOUS

## 2016-10-03 MED ORDER — VERAPAMIL HCL 2.5 MG/ML IV SOLN
INTRAVENOUS | Status: DC | PRN
  Administered 2016-10-03: 2 mL via INTRA_ARTERIAL

## 2016-10-03 MED ORDER — ASPIRIN 81 MG PO CHEW
CHEWABLE_TABLET | ORAL | Status: AC
  Filled 2016-10-03: qty 1

## 2016-10-03 MED ORDER — IOPAMIDOL (ISOVUE-370) INJECTION 76%
INTRAVENOUS | Status: DC | PRN
  Administered 2016-10-03: 50 mL via INTRA_ARTERIAL

## 2016-10-03 MED ORDER — DOPAMINE-DEXTROSE 3.2-5 MG/ML-% IV SOLN
INTRAVENOUS | Status: AC
  Filled 2016-10-03: qty 250

## 2016-10-03 MED ORDER — SODIUM CHLORIDE 0.9% FLUSH: 3.0000 mL | INTRAVENOUS | Status: DC | PRN

## 2016-10-03 MED ORDER — SODIUM CHLORIDE 0.9% FLUSH: 3.0000 mL | Freq: Two times a day (BID) | INTRAVENOUS | Status: DC

## 2016-10-03 MED ORDER — SODIUM CHLORIDE 0.9 % IV SOLN: INTRAVENOUS | Status: DC

## 2016-10-03 MED ORDER — SODIUM CHLORIDE 0.9 % WEIGHT BASED INFUSION: 1.0000 mL/kg/h | INTRAVENOUS | Status: DC

## 2016-10-03 MED ORDER — FENTANYL CITRATE (PF) 100 MCG/2ML IJ SOLN
INTRAMUSCULAR | Status: AC
  Filled 2016-10-03: qty 2

## 2016-10-03 MED ORDER — MIDAZOLAM HCL 2 MG/2ML IJ SOLN
INTRAMUSCULAR | Status: AC
Start: 2016-10-03 — End: 2016-10-03
  Filled 2016-10-03: qty 2

## 2016-10-03 MED ORDER — ONDANSETRON HCL 4 MG/2ML IJ SOLN: 4.0000 mg | Freq: Four times a day (QID) | INTRAMUSCULAR | Status: DC | PRN

## 2016-10-03 MED ORDER — LIDOCAINE HCL (PF) 1 % IJ SOLN
INTRAMUSCULAR | Status: AC
  Filled 2016-10-03: qty 30

## 2016-10-03 MED ORDER — FENTANYL CITRATE (PF) 100 MCG/2ML IJ SOLN
INTRAMUSCULAR | Status: DC | PRN
  Administered 2016-10-03: 25 ug via INTRAVENOUS
  Administered 2016-10-03: 50 ug via INTRAVENOUS

## 2016-10-03 MED ORDER — ACETAMINOPHEN 325 MG PO TABS: 650.0000 mg | ORAL_TABLET | ORAL | Status: DC | PRN

## 2016-10-03 MED ORDER — SODIUM CHLORIDE 0.9 % WEIGHT BASED INFUSION
3.0000 mL/kg/h | INTRAVENOUS | Status: AC
  Administered 2016-10-03: 3 mL/kg/h via INTRAVENOUS

## 2016-10-03 MED ORDER — ASPIRIN 81 MG PO CHEW: 81.0000 mg | CHEWABLE_TABLET | ORAL | Status: DC

## 2016-10-03 SURGICAL SUPPLY — 12 items
CATH INFINITI 5FR MULTPACK ANG (CATHETERS) ×2 IMPLANT
DEVICE RAD COMP TR BAND LRG (VASCULAR PRODUCTS) ×2 IMPLANT
GLIDESHEATH SLEND SS 6F .021 (SHEATH) ×2 IMPLANT
GUIDEWIRE INQWIRE 1.5J.035X260 (WIRE) ×1 IMPLANT
INQWIRE 1.5J .035X260CM (WIRE) ×2
KIT HEART LEFT (KITS) ×2 IMPLANT
PACK CARDIAC CATHETERIZATION (CUSTOM PROCEDURE TRAY) ×2 IMPLANT
SHEATH PINNACLE 5F 10CM (SHEATH) ×2 IMPLANT
SYR MEDRAD MARK V 150ML (SYRINGE) ×2 IMPLANT
TRANSDUCER W/STOPCOCK (MISCELLANEOUS) ×2 IMPLANT
TUBING CIL FLEX 10 FLL-RA (TUBING) ×2 IMPLANT
WIRE HI TORQ VERSACORE-J 145CM (WIRE) ×2 IMPLANT

## 2016-10-03 NOTE — Interval H&P Note (Signed)
Cath Lab Visit (complete for each Cath Lab visit)  Clinical Evaluation Leading to the Procedure:   ACS: No.  Non-ACS:    Anginal Classification: CCS III  Anti-ischemic medical therapy: No Therapy  Non-Invasive Test Results: No non-invasive testing performed  Prior CABG: No previous CABG      History and Physical Interval Note:  10/03/2016 8:06 AM  Jonathan Rivas  has presented today for surgery, with the diagnosis of angina, dyspnea upon excertion  The various methods of treatment have been discussed with the patient and family. After consideration of risks, benefits and other options for treatment, the patient has consented to  Procedure(s): Left Heart Cath and Coronary Angiography (N/A) as a surgical intervention .  The patient's history has been reviewed, patient examined, no change in status, stable for surgery.  I have reviewed the patient's chart and labs.  Questions were answered to the patient's satisfaction.     Shelva Majestic

## 2016-10-03 NOTE — H&P (View-Only) (Signed)
Cardiology Office Note  NEW PATIENT  Date:  10/01/2016   ID:  LD EPLIN, DOB [birth year], MRN [id number]  PCP:  Lupita Dawn, MD  Cardiologist:  New  Dr. Marlou Porch  Chief Complaint  Patient presents with  . Palpitations      History of Present Illness: Jonathan Rivas is a 45 y.o. male who presents for palpitations. He is referred by Dr. Ree Kida his PCP. Pt has strong and fast Heart beat that he hears in his ears when he stands then he becomes lightheaded and develops a headache thereafter.  He also complains of DOE with walking at first it was with strenuous activity and at times chest pressure, resolves with rest, now occurs more frequently.  Walking up hill he became so SOB that he felt he would pass out.  He is concerned in part due to premature family hx of CAD.   He does smoke 1 ppd and has since age 69, has tried to stop using Chantix which caused nightmares and cold [place] stopping which did not work.  We talked about importance of stopping.       He also relates increase of sweating separate from other complaints. Very little and he sweats.  He believes he snores but lives alone and is not sure.   Recent labs with LDL 122. HDL 46, EKG from 09/25/16 SR normal EKG.     Past Medical History:  Diagnosis Date  . Depression   . Erectile dysfunction   . Obesity, morbid, BMI 40.0-49.9 ([place])   . Tobacco use     No past surgical history on file.  No past surgical hx.   Current Outpatient Prescriptions  Medication Sig Dispense Refill  . aspirin 81 MG tablet Take 81 mg by mouth daily.    . Multiple Vitamin (MULTIVITAMIN) capsule Take 1 capsule by mouth daily.    . nicotine polacrilex ([employer]S NICOTINE POLACRILEX) 4 MG gum Take 1 each (4 mg total) by mouth as needed for smoking cessation. 100 tablet 0  . rOPINIRole (REQUIP) 2 MG tablet TAKE 1 TABLET (2 MG TOTAL) BY MOUTH AT BEDTIME. 90 tablet 1  . traZODone (DESYREL) 50 MG tablet TAKE 1-2 TABLETS BY MOUTH  AT BEDTIME AS  NEEDED FOR  SLEEP 60 tablet 2  . venlafaxine XR (EFFEXOR-XR) 150 MG 24 hr capsule Take 1 capsule by mouth  daily with breakfast 90 capsule 1   No current facility-administered medications for this visit.     Allergies:   Patient has no known allergies.    Social History:  The patient  reports that he has been smoking Cigarettes.  He has a 12.50 pack-year smoking history. He does not have any smokeless tobacco history on file. He reports that he drinks about 1.2 oz of alcohol per week . He reports that he does not use drugs.   Family History:  The patient's family history includes Alcohol abuse in his brother and maternal aunt; Asthma in his maternal aunt; Cancer in his mother; Depression in his brother; Drug abuse in his brother; Heart disease in his father, paternal grandfather, and paternal uncle.    ROS:  General:no colds or fevers, no weight changes- has tried to lose wt without success.  Skin:no rashes or ulcers HEENT:no blurred vision, no congestion CV:see HPI PUL:see HPI- smokes 1 ppd and has tried to stop with Chantix and cold [place], is going to use nicoderm gum now.  GI:no diarrhea constipation or melena, no indigestion GU:+ chronic  microscopic hematuria has been evaluated by urology and idiopathic, no dysuria MS:Rt knee pain, no claudication Neuro:no syncope, no lightheadedness ( on BP meds at one point and had syncope and with EMS was hypotensive.) Endo:no diabetes, no thyroid disease  Wt Readings from Last 3 Encounters:  10/01/16 299 lb 8 oz (135.9 kg)  09/25/16 300 lb (136.1 kg)  02/08/16 286 lb (129.7 kg)     PHYSICAL EXAM: VS:  BP 132/84 (BP Location: Left Arm, Patient Position: Sitting, Cuff Size: Large)   Pulse 87   Ht 5\' 9"  (1.753 m)   Wt 299 lb 8 oz (135.9 kg)   BMI 44.23 kg/m  , BMI Body mass index is 44.23 kg/m. General:Pleasant affect, NAD Skin:Warm and dry, brisk capillary refill HEENT:normocephalic, sclera clear, mucus membranes moist Neck:supple, no  JVD, no bruits  Heart:S1S2 RRR without murmur, gallup, rub or click Lungs:clear without rales, rhonchi, or wheezes [id number], non tender, + BS, do not palpate liver spleen or masses Ext:no lower ext edema, 3+ pedal pulses, 2+ radial pulses Neuro:alert and oriented, MAE, follows commands, + facial symmetry    EKG:  EKG is NOT ordered today. The ekg 09/25/16 reviewed see above.  Recent Labs: 09/25/2016: ALT 39; BUN 9; Creat 0.74; Hemoglobin 14.8; Platelets 263; Potassium 4.4; Sodium 141; TSH 0.78    Lipid Panel    Component Value Date/Time   CHOL 190 09/25/2016 1443   TRIG 111 09/25/2016 1443   HDL 46 09/25/2016 1443   CHOLHDL 4.1 09/25/2016 1443   VLDL 22 09/25/2016 1443   LDLCALC 122 09/25/2016 1443       Other studies Reviewed: Additional studies/ records that were reviewed today include: previous notes.   ASSESSMENT AND PLAN:  1.  DOE with chest pressure- with risk factors of Premature FH CAD, +tobacco use, and obesity.  This may be angina and with his morbid obesity not sure the nuc study would give info we need.  Discussed with Dr. Marlou Porch who has seen him as well and plan will be for cardiac cath to know if indeed this is due to CAD.    We discussed with Pt and he is agreeable to proceed this week with cath.  Aware of if needed stent to coronary artery that would require overnight stay.    The patient understands that risks included but are not limited to stroke (1 in 1000), death (1 in 20), kidney failure [usually temporary] (1 in 500), bleeding (1 in 200), allergic reaction [possibly serious] (1 in 200).   2.  Rapid forceful heart rate. With associated lightheadedness- recent TSH and lytes are normal.  Will check 24 hour holter.   3. Morbid obesity, he exercises, though currently difficult- he may need wt loss management system  4. Diaphoresis, possibly due to sleep apnea.  We may schedule sleep study once cardiac work up complete.  5. Tobacco use discussed  cessation.   Pt will be using nicoderm gum.   Pt will follow up after cath.  Current medicines are reviewed with the patient today.  The patient Has no concerns regarding medicines.  The following changes have been made:  See above Labs/ tests ordered today include:see above  Disposition:   FU:  see above  Signed, Cecilie Kicks, NP  10/01/2016 11:33 AM    [facility], [place], [place], [place] Phone: [phone]; Fax: [phone]  Personally seen and examined. Agree  with above. Given his multiple cardiac risk factors, obesity which would decrease his overall sensitivity for stress testing, I do believe that his anginal symptoms should be evaluated with cardiac catheterization. He is willing to proceed. Excellent radial pulse. Heart is regular rate and rhythm.  Candee Furbish, MD

## 2016-10-03 NOTE — Discharge Instructions (Signed)
Excuse from Work, [employer], or Physical Activity ____________MICHAEL MADISON______________________________________ needs to be excused from: ___X__ Work _____ [employer] _____ Physical activity beginning now and through the following date: _______11/13/17_____________. _____ He or she may return to work or school but should still avoid the following physical activity or activities from now until ____________________. Activity restrictions include: ___X__ Lifting more than _10______ lb UNTIL 10/09/16 _____ Sitting longer than __________ minutes at a time _____ Standing longer than ________ minutes at a time _____ He or she may return to full physical activity as of ____________________. Health Care Provider Name (printed): [facility] ______________________________________  Health Care Provider (signature): ___________________________________________ Date: ____11/10/17____________   This information is not intended to replace advice given to you by your health care provider. Make sure you discuss any questions you have with your health care provider.   Document Released: 05/06/2001 Document Revised: 12/01/2014 Document Reviewed: 06/12/2014 Elsevier Interactive Patient Education 2016 [place]. Groin Surgical Site Care Refer to this sheet in the next few weeks. These instructions provide you with information about caring for yourself after your procedure. Your health care provider may also give you more specific instructions. Your treatment has been planned according to current medical practices, but problems sometimes occur. Call your health care provider if you have any problems or questions after your procedure. WHAT TO EXPECT AFTER THE PROCEDURE After your procedure, it is typical to have the following:  Bruising at the groin site that usually fades within 1-2 weeks.  Blood collecting in the tissue (hematoma) that may be painful to the touch. It should usually decrease in size and  tenderness within 1-2 weeks. HOME CARE INSTRUCTIONS  Take medicines only as directed by your health care provider.  You may shower 24-48 hours after the procedure or as directed by your health care provider. Remove the bandage (dressing) and gently wash the site with plain soap and water. Pat the area dry with a clean towel. Do not rub the site, because this may cause bleeding.  Do not take baths, swim, or use a hot tub until your health care provider approves.  Check your insertion site every day for redness, swelling, or drainage.  Do not apply powder or lotion to the site.  Limit use of stairs to twice a day for the first 2-3 days or as directed by your health care provider.  Do not squat for the first 2-3 days or as directed by your health care provider.  Do not lift over 10 lb (4.5 kg) for 5 days after your procedure or as directed by your health care provider.  Ask your health care provider when it is okay to:  Return to work or school.  Resume usual physical activities or sports.  Resume sexual activity.  Do not drive home if you are discharged the same day as the procedure. Have someone else drive you.  You may drive 24 hours after the procedure unless otherwise instructed by your health care provider.  Do not operate machinery or power tools for 24 hours after the procedure or as directed by your health care provider.  If your procedure was done as an outpatient procedure, which means that you went home the same day as your procedure, a responsible adult should be with you for the first 24 hours after you arrive home.  Keep all follow-up visits as directed by your health care provider. This is important. SEEK MEDICAL CARE IF:  You have a fever.  You have chills.  You have  increased bleeding from the groin site. Hold pressure on the site. SEEK IMMEDIATE MEDICAL CARE IF:  You have unusual pain at the groin site.  You have redness, warmth, or swelling at the groin  site.  You have drainage (other than a small amount of blood on the dressing) from the groin site.  The groin site is bleeding, and the bleeding does not stop after 30 minutes of holding steady pressure on the site.  Your leg or foot becomes pale, cool, tingly, or numb.   This information is not intended to replace advice given to you by your health care provider. Make sure you discuss any questions you have with your health care provider.   Document Released: 07/14/2014 Document Reviewed: 07/14/2014 Elsevier Interactive Patient Education 2016 [place] Refer to this sheet in the next few weeks. These instructions provide you with information about caring for yourself after your procedure. Your health care provider may also give you more specific instructions. Your treatment has been planned according to current medical practices, but problems sometimes occur. Call your health care provider if you have any problems or questions after your procedure. WHAT TO EXPECT AFTER THE PROCEDURE After your procedure, it is typical to have the following:  Bruising at the radial site that usually fades within 1-2 weeks.  Blood collecting in the tissue (hematoma) that may be painful to the touch. It should usually decrease in size and tenderness within 1-2 weeks. HOME CARE INSTRUCTIONS  Take medicines only as directed by your health care provider.  You may shower 24-48 hours after the procedure or as directed by your health care provider. Remove the bandage (dressing) and gently wash the site with plain soap and water. Pat the area dry with a clean towel. Do not rub the site, because this may cause bleeding.  Do not take baths, swim, or use a hot tub until your health care provider approves.  Check your insertion site every day for redness, swelling, or drainage.  Do not apply powder or lotion to the site.  Do not flex or bend the affected arm for 24 hours or as directed by  your health care provider.  Do not push or pull heavy objects with the affected arm for 24 hours or as directed by your health care provider.  Do not lift over 10 lb (4.5 kg) for 5 days after your procedure or as directed by your health care provider.  Ask your health care provider when it is okay to:  Return to work or school.  Resume usual physical activities or sports.  Resume sexual activity.  Do not drive home if you are discharged the same day as the procedure. Have someone else drive you.  You may drive 24 hours after the procedure unless otherwise instructed by your health care provider.  Do not operate machinery or power tools for 24 hours after the procedure.  If your procedure was done as an outpatient procedure, which means that you went home the same day as your procedure, a responsible adult should be with you for the first 24 hours after you arrive home.  Keep all follow-up visits as directed by your health care provider. This is important. SEEK MEDICAL CARE IF:  You have a fever.  You have chills.  You have increased bleeding from the radial site. Hold pressure on the site. SEEK IMMEDIATE MEDICAL CARE IF:  You have unusual pain at the radial site.  You have redness,  warmth, or swelling at the radial site.  You have drainage (other than a small amount of blood on the dressing) from the radial site.  The radial site is bleeding, and the bleeding does not stop after 30 minutes of holding steady pressure on the site.  Your arm or hand becomes pale, cool, tingly, or numb.   This information is not intended to replace advice given to you by your health care provider. Make sure you discuss any questions you have with your health care provider.   Document Released: 12/13/2010 Document Revised: 12/01/2014 Document Reviewed: 05/29/2014 Elsevier Interactive Patient Education [employer].

## 2016-10-03 NOTE — Progress Notes (Signed)
Site area: rt groin Site Prior to Removal:  Level 0 Pressure Applied For:  20 minutes Manual:   yes Patient Status During Pull:  stable Post Pull Site:  Level  0 Post Pull Instructions Given:  Yes   Post Pull Pulses Present: yes Dressing Applied:  tegaderm/gauze Bedrest begins @ [phone] Comments:

## 2016-10-04 ENCOUNTER — Encounter: Payer: Self-pay | Admitting: Cardiology

## 2016-10-06 ENCOUNTER — Encounter: Payer: Self-pay | Admitting: Family Medicine

## 2016-10-15 DIAGNOSIS — F4321 Adjustment disorder with depressed mood: Secondary | ICD-10-CM | POA: Diagnosis not present

## 2016-11-12 ENCOUNTER — Other Ambulatory Visit: Payer: Self-pay | Admitting: Family Medicine

## 2016-11-15 ENCOUNTER — Other Ambulatory Visit: Payer: Self-pay | Admitting: Family Medicine

## 2016-12-25 ENCOUNTER — Encounter: Payer: Self-pay | Admitting: Family Medicine

## 2016-12-26 ENCOUNTER — Other Ambulatory Visit: Payer: Self-pay | Admitting: Family Medicine

## 2016-12-26 MED ORDER — ROPINIROLE HCL 2 MG PO TABS: 2.0000 mg | ORAL_TABLET | Freq: Every day | ORAL | 1 refills | Status: DC

## 2016-12-26 MED ORDER — TRAZODONE HCL 50 MG PO TABS: 50.0000 mg | ORAL_TABLET | Freq: Every evening | ORAL | 2 refills | Status: DC | PRN

## 2016-12-26 MED ORDER — VENLAFAXINE HCL ER 150 MG PO CP24: ORAL_CAPSULE | ORAL | 1 refills | Status: DC

## 2017-03-27 ENCOUNTER — Other Ambulatory Visit: Payer: Self-pay | Admitting: Family Medicine

## 2017-06-10 ENCOUNTER — Other Ambulatory Visit: Payer: Self-pay | Admitting: *Deleted

## 2017-06-11 MED ORDER — ROPINIROLE HCL 2 MG PO TABS: 2.0000 mg | ORAL_TABLET | Freq: Every day | ORAL | 1 refills | Status: DC

## 2017-09-08 ENCOUNTER — Other Ambulatory Visit: Payer: Self-pay | Admitting: Family Medicine

## 2017-09-08 ENCOUNTER — Encounter: Payer: Self-pay | Admitting: Internal Medicine

## 2017-09-08 MED ORDER — VENLAFAXINE HCL ER 150 MG PO CP24: ORAL_CAPSULE | ORAL | 1 refills | Status: DC

## 2017-09-09 ENCOUNTER — Other Ambulatory Visit: Payer: Self-pay | Admitting: Family Medicine

## 2017-09-09 ENCOUNTER — Encounter: Payer: Self-pay | Admitting: Family Medicine

## 2017-09-09 DIAGNOSIS — F331 Major depressive disorder, recurrent, moderate: Secondary | ICD-10-CM

## 2017-09-09 MED ORDER — VENLAFAXINE HCL ER 150 MG PO CP24: ORAL_CAPSULE | ORAL | 1 refills | Status: DC

## 2017-09-25 ENCOUNTER — Encounter: Payer: Self-pay | Admitting: Internal Medicine

## 2017-09-25 ENCOUNTER — Ambulatory Visit (INDEPENDENT_AMBULATORY_CARE_PROVIDER_SITE_OTHER): Payer: 59 | Admitting: Internal Medicine

## 2017-09-25 VITALS — BP 130/84 | HR 84 | Temp 98.5°F | Ht 69.0 in | Wt 253.6 lb

## 2017-09-25 DIAGNOSIS — Z720 Tobacco use: Secondary | ICD-10-CM | POA: Diagnosis not present

## 2017-09-25 DIAGNOSIS — F331 Major depressive disorder, recurrent, moderate: Secondary | ICD-10-CM | POA: Diagnosis not present

## 2017-09-25 DIAGNOSIS — Z0000 Encounter for general adult medical examination without abnormal findings: Secondary | ICD-10-CM | POA: Diagnosis not present

## 2017-09-25 DIAGNOSIS — E669 Obesity, unspecified: Secondary | ICD-10-CM

## 2017-09-25 DIAGNOSIS — Z6837 Body mass index (BMI) 37.0-37.9, adult: Secondary | ICD-10-CM

## 2017-09-25 MED ORDER — TRAZODONE HCL 50 MG PO TABS: 50.0000 mg | ORAL_TABLET | Freq: Every evening | ORAL | 1 refills | Status: DC | PRN

## 2017-09-25 MED ORDER — VENLAFAXINE HCL ER 150 MG PO CP24: ORAL_CAPSULE | ORAL | 1 refills | Status: DC

## 2017-09-25 MED ORDER — ROPINIROLE HCL 2 MG PO TABS: 2.0000 mg | ORAL_TABLET | Freq: Every day | ORAL | 1 refills | Status: DC

## 2017-09-25 NOTE — Patient Instructions (Signed)
Great job on the weight loss and lifestyle changes! It is very rare that we see such improvements and dedication in such a short period of time! Very impressive.   I am happy to hear that you are signed up for the smoking cessation classes. If you need any additional help we are always available.   We will check your basic metabolic labs and lipid panel today. We will contact you with the results next week.   Wonderful to meet you!   Dr. Juleen China

## 2017-09-25 NOTE — Progress Notes (Signed)
Subjective:    Jonathan Rivas - 46 y.o. male MRN [id number]  Date of birth: December 11, 1970  HPI  Jonathan Rivas is here for annual exam.  Obesity: Patient with excellent weight loss and lifestyle changes. Over the past year, has lost close to 50lbs and decreased BMI significantly. He is now completing cardio and weight lifting at least 3x per week. Has been able to increase the incline on the treadmill over the past year. Last year when he went to the mountains he was unable to complete a 1/4 mile hike and had significant SOB. Reports he just returned to do the same hike this past weekend and was able to accomplish over a mile hike with steep incline.   ROS:  Patient reports no  vision/ hearing changes,anorexia, weight change, fever ,adenopathy, persistant / recurrent hoarseness, swallowing issues, chest pain, edema,persistant / recurrent cough, hemoptysis, dyspnea(rest, exertional, paroxysmal nocturnal), gastrointestinal  bleeding (melena, rectal bleeding), abdominal pain, excessive heart burn, GU symptoms(dysuria, hematuria, pyuria, voiding/incontinence  Issues) syncope, focal weakness, severe memory loss, concerning skin lesions, depression, anxiety, abnormal bruising/bleeding, major joint swelling.    Health Maintenance:  There are no preventive care reminders to display for this patient.  -  reports that he has been smoking Cigarettes.  He has a 12.50 pack-year smoking history. He has never used smokeless tobacco. - Review of Systems: Per HPI. - Past Medical History: Patient Active Problem List   Diagnosis Date Noted  . Numbness and tingling in both hands 09/25/2016  . Palpitations 09/25/2016  . Preventative health care 04/10/2015  . Depression 04/10/2015  . Restless leg syndrome, controlled 04/10/2015  . Erectile dysfunction 04/10/2015  . Tobacco use disorder 04/10/2015  . Morbid obesity ([place]) 04/10/2015  . Elevated blood pressure reading 04/10/2015   - Medications: reviewed  and updated   Objective:   Physical Exam BP 130/84   Pulse 84   Temp 98.5 F (36.9 C) (Oral)   Ht 5\' 9"  (1.753 m)   Wt 253 lb 9.6 oz (115 kg)   SpO2 96%   BMI 37.45 kg/m  Gen: NAD, alert, cooperative with exam, well-appearing HEENT: NCAT, PERRL, clear conjunctiva, oropharynx clear, supple neck CV: RRR, good S1/S2, no murmur, no edema, capillary refill brisk  Resp: CTABL, no wheezes, non-labored Abd: SNTND, BS present, no guarding or organomegaly Skin: no rashes, normal turgor  Neuro: no gross deficits.  Psych: good insight, alert and oriented    Assessment & Plan:   1. Class 2 obesity without serious comorbidity with body mass index (BMI) of 37.0 to 37.9 in adult, unspecified obesity type Congratulated patient on impressive lifestyle changes and dedication to improving his health. Encouraged continued exercise and healthy weight loss. Will check BMET and lipid panel today.  - Basic Metabolic Panel - Lipid Panel  2. Major depressive disorder [facility]) Patient reports his mood is much better since losing weight and exercising. PHQ-2 score of 0 today. Continue current therapy.  - venlafaxine XR (EFFEXOR-XR) 150 MG 24 hr capsule; TAKE 1 CAPSULE BY MOUTH  DAILY WITH BREAKFAST  Dispense: 90 capsule; Refill: 1  3. Tobacco Abuse  Patient currently smoking 3/4 ppd. He plans to quit but feels he needs to take things one thing at a time. He has signed up for the [place] Smoking Cessation educational classes that start later this week. Discussed that if needs other therapy or pharmacologic assistance to quit that we are available and happy to help.   Phill Myron, D.O. 09/25/2017,  8:42 AM PGY-3, [place]

## 2017-09-26 LAB — LIPID PANEL
Chol/HDL Ratio: 3.7 ratio (ref 0.0–5.0)
Cholesterol, Total: 182 mg/dL (ref 100–199)
HDL: 49 mg/dL (ref >39)
LDL CALC: 117 mg/dL — AB (ref 0–99)
Triglycerides: 80 mg/dL (ref 0–149)
VLDL CHOLESTEROL CAL: 16 mg/dL (ref 5–40)

## 2017-09-26 LAB — BASIC METABOLIC PANEL
BUN / CREAT RATIO: 19 (ref 9–20)
BUN: 13 mg/dL (ref 6–24)
CO2: 24 mmol/L (ref 20–29)
CREATININE: 0.69 mg/dL — AB (ref 0.76–1.27)
Calcium: 9 mg/dL (ref 8.7–10.2)
Chloride: 102 mmol/L (ref 96–106)
GFR calc non Af Amer: 114 mL/min/{1.73_m2} (ref >59)
GFR, EST AFRICAN AMERICAN: 132 mL/min/{1.73_m2} (ref >59)
Glucose: 84 mg/dL (ref 65–99)
Potassium: 4.6 mmol/L (ref 3.5–5.2)
SODIUM: 140 mmol/L (ref 134–144)

## 2017-10-01 ENCOUNTER — Telehealth: Payer: Self-pay | Admitting: Internal Medicine

## 2017-10-01 NOTE — Telephone Encounter (Signed)
See result note for telephone call.   Phill Myron, D.O. 10/01/2017, 2:43 PM PGY-3, [place]

## 2017-10-08 NOTE — Telephone Encounter (Signed)
Attempted to call again.  No answer and no machine.  Will send letter via mychart with the following:   Attempted to call patient with lab results. Left VM asking him to return call. Electrolytes, blood sugar, and kidney function all within normal limits. His cholesterol is improving with weight loss. I would recommend he keep up good work with cardiovascular physical activities. Eating less red meat and more fruits/veggies can help as well.   Phill Myron, D.O.  10/01/2017, 2:42 PM  PGY-3, [place]

## 2017-11-19 ENCOUNTER — Encounter: Payer: Self-pay | Admitting: Family Medicine

## 2017-12-03 ENCOUNTER — Telehealth: Payer: No Typology Code available for payment source | Admitting: Family

## 2017-12-03 DIAGNOSIS — J029 Acute pharyngitis, unspecified: Secondary | ICD-10-CM

## 2017-12-03 MED ORDER — PREDNISONE 5 MG PO TABS: 5.0000 mg | ORAL_TABLET | ORAL | 0 refills | Status: DC

## 2017-12-03 MED ORDER — BENZONATATE 100 MG PO CAPS: 100.0000 mg | ORAL_CAPSULE | Freq: Three times a day (TID) | ORAL | 0 refills | Status: DC | PRN

## 2017-12-03 NOTE — Progress Notes (Signed)
Thank you for the details you included in the comment boxes. Those details are very helpful in determining the best course of treatment for you and help [place] to provide the best care.  We are sorry that you are not feeling well.  Here is how we plan to help!  Based on your presentation I believe you most likely have A cough due to a virus.  This is called viral bronchitis and is best treated by rest, plenty of fluids and control of the cough.  You may use Ibuprofen or Tylenol as directed to help your symptoms.     In addition you may use A prescription cough medication called Tessalon Perles 100mg . You may take 1-2 capsules every 8 hours as needed for your cough.  Sterapred 5 mg dosepak  From your responses in the eVisit questionnaire you describe inflammation in the upper respiratory tract which is causing a significant cough.  This is commonly called Bronchitis and has four common causes:    Allergies  Viral Infections  Acid Reflux  Bacterial Infection Allergies, viruses and acid reflux are treated by controlling symptoms or eliminating the cause. An example might be a cough caused by taking certain blood pressure medications. You stop the cough by changing the medication. Another example might be a cough caused by acid reflux. Controlling the reflux helps control the cough.  USE OF BRONCHODILATOR ("RESCUE") INHALERS: There is a risk from using your bronchodilator too frequently.  The risk is that over-reliance on a medication which only relaxes the muscles surrounding the breathing tubes can reduce the effectiveness of medications prescribed to reduce swelling and congestion of the tubes themselves.  Although you feel brief relief from the bronchodilator inhaler, your asthma may actually be worsening with the tubes becoming more swollen and filled with mucus.  This can delay other crucial treatments, such as oral steroid medications. If you need to use a bronchodilator inhaler daily, several  times per day, you should discuss this with your provider.  There are probably better treatments that could be used to keep your asthma under control.     HOME CARE . Only take medications as instructed by your medical team. . Complete the entire course of an antibiotic. . Drink plenty of fluids and get plenty of rest. . Avoid close contacts especially the very young and the elderly . Cover your mouth if you cough or cough into your sleeve. . Always remember to wash your hands . A steam or ultrasonic humidifier can help congestion.   GET HELP RIGHT AWAY IF: . You develop worsening fever. . You become short of breath . You cough up blood. . Your symptoms persist after you have completed your treatment plan MAKE SURE YOU   Understand these instructions.  Will watch your condition.  Will get help right away if you are not doing well or get worse.  Your e-visit answers were reviewed by a board certified advanced clinical practitioner to complete your personal care plan.  Depending on the condition, your plan could have included both over the counter or prescription medications. If there is a problem please reply  once you have received a response from your provider. Your safety is important to [place].  If you have drug allergies check your prescription carefully.    You can use MyChart to ask questions about today's visit, request a non-urgent call back, or ask for a work or school excuse for 24 hours related to this e-Visit. If it has been  greater than 24 hours you will need to follow up with your provider, or enter a new e-Visit to address those concerns. You will get an e-mail in the next two days asking about your experience.  I hope that your e-visit has been valuable and will speed your recovery. Thank you for using e-visits.   

## 2017-12-05 ENCOUNTER — Telehealth: Payer: No Typology Code available for payment source | Admitting: Family

## 2017-12-05 DIAGNOSIS — B9689 Other specified bacterial agents as the cause of diseases classified elsewhere: Secondary | ICD-10-CM

## 2017-12-05 DIAGNOSIS — J028 Acute pharyngitis due to other specified organisms: Secondary | ICD-10-CM | POA: Diagnosis not present

## 2017-12-05 MED ORDER — AZITHROMYCIN 250 MG PO TABS: ORAL_TABLET | ORAL | 0 refills | Status: DC

## 2017-12-05 NOTE — Progress Notes (Signed)
Thank you for the details you included in the comment boxes. Those details are very helpful in determining the best course of treatment for you and help [place] to provide the best care. It is possible that it is bacterial or was viral and then progressed. See antibiotics below. 89% of the time, these are viral, but you may unfortunately be among the 11% who gets worse.  We are sorry that you are not feeling well.  Here is how we plan to help!  Based on your presentation I believe you most likely have A cough due to bacteria.  When patients have a fever and a productive cough with a change in color or increased sputum production, we are concerned about bacterial bronchitis.  If left untreated it can progress to pneumonia.  If your symptoms do not improve with your treatment plan it is important that you contact your provider.   I have prescribed Azithromyin 250 mg: two tablets now and then one tablet daily for 4 additonal days    In addition you may use the cough meds you are currently using from the other instructions   From your responses in the eVisit questionnaire you describe inflammation in the upper respiratory tract which is causing a significant cough.  This is commonly called Bronchitis and has four common causes:    Allergies  Viral Infections  Acid Reflux  Bacterial Infection Allergies, viruses and acid reflux are treated by controlling symptoms or eliminating the cause. An example might be a cough caused by taking certain blood pressure medications. You stop the cough by changing the medication. Another example might be a cough caused by acid reflux. Controlling the reflux helps control the cough.  USE OF BRONCHODILATOR ("RESCUE") INHALERS: There is a risk from using your bronchodilator too frequently.  The risk is that over-reliance on a medication which only relaxes the muscles surrounding the breathing tubes can reduce the effectiveness of medications prescribed to reduce swelling and  congestion of the tubes themselves.  Although you feel brief relief from the bronchodilator inhaler, your asthma may actually be worsening with the tubes becoming more swollen and filled with mucus.  This can delay other crucial treatments, such as oral steroid medications. If you need to use a bronchodilator inhaler daily, several times per day, you should discuss this with your provider.  There are probably better treatments that could be used to keep your asthma under control.     HOME CARE . Only take medications as instructed by your medical team. . Complete the entire course of an antibiotic. . Drink plenty of fluids and get plenty of rest. . Avoid close contacts especially the very young and the elderly . Cover your mouth if you cough or cough into your sleeve. . Always remember to wash your hands . A steam or ultrasonic humidifier can help congestion.   GET HELP RIGHT AWAY IF: . You develop worsening fever. . You become short of breath . You cough up blood. . Your symptoms persist after you have completed your treatment plan MAKE SURE YOU   Understand these instructions.  Will watch your condition.  Will get help right away if you are not doing well or get worse.  Your e-visit answers were reviewed by a board certified advanced clinical practitioner to complete your personal care plan.  Depending on the condition, your plan could have included both over the counter or prescription medications. If there is a problem please reply  once you have received  a response from your provider. Your safety is important to [place].  If you have drug allergies check your prescription carefully.    You can use MyChart to ask questions about today's visit, request a non-urgent call back, or ask for a work or school excuse for 24 hours related to this e-Visit. If it has been greater than 24 hours you will need to follow up with your provider, or enter a new e-Visit to address those concerns. You will  get an e-mail in the next two days asking about your experience.  I hope that your e-visit has been valuable and will speed your recovery. Thank you for using e-visits.

## 2017-12-25 ENCOUNTER — Encounter: Payer: Self-pay | Admitting: Family Medicine

## 2017-12-28 ENCOUNTER — Other Ambulatory Visit: Payer: Self-pay | Admitting: Family Medicine

## 2017-12-28 ENCOUNTER — Encounter: Payer: Self-pay | Admitting: Family Medicine

## 2017-12-28 DIAGNOSIS — G4700 Insomnia, unspecified: Secondary | ICD-10-CM

## 2017-12-28 DIAGNOSIS — G2581 Restless legs syndrome: Secondary | ICD-10-CM

## 2017-12-28 DIAGNOSIS — F331 Major depressive disorder, recurrent, moderate: Secondary | ICD-10-CM

## 2017-12-28 HISTORY — DX: Insomnia, unspecified: G47.00

## 2017-12-28 MED ORDER — ROPINIROLE HCL 2 MG PO TABS: 2.0000 mg | ORAL_TABLET | Freq: Every day | ORAL | 1 refills | Status: DC

## 2017-12-28 MED ORDER — TRAZODONE HCL 50 MG PO TABS: 50.0000 mg | ORAL_TABLET | Freq: Every evening | ORAL | 1 refills | Status: DC | PRN

## 2017-12-28 MED ORDER — VENLAFAXINE HCL ER 150 MG PO CP24: ORAL_CAPSULE | ORAL | 1 refills | Status: DC

## 2018-03-23 ENCOUNTER — Encounter: Payer: Self-pay | Admitting: Family Medicine

## 2018-04-09 ENCOUNTER — Encounter: Payer: Self-pay | Admitting: Family Medicine

## 2018-04-09 DIAGNOSIS — F17200 Nicotine dependence, unspecified, uncomplicated: Secondary | ICD-10-CM

## 2018-04-13 MED ORDER — VARENICLINE TARTRATE 0.5 MG X 11 & 1 MG X 42 PO MISC: ORAL | 0 refills | Status: DC

## 2018-04-13 MED ORDER — VARENICLINE TARTRATE 1 MG PO TABS: 1.0000 mg | ORAL_TABLET | Freq: Two times a day (BID) | ORAL | 1 refills | Status: DC

## 2018-06-05 ENCOUNTER — Other Ambulatory Visit: Payer: Self-pay | Admitting: Family Medicine

## 2018-06-05 DIAGNOSIS — G4700 Insomnia, unspecified: Secondary | ICD-10-CM

## 2018-06-05 DIAGNOSIS — G2581 Restless legs syndrome: Secondary | ICD-10-CM

## 2018-06-05 DIAGNOSIS — F331 Major depressive disorder, recurrent, moderate: Secondary | ICD-10-CM

## 2018-08-11 ENCOUNTER — Encounter: Payer: Self-pay | Admitting: Internal Medicine

## 2018-08-23 ENCOUNTER — Other Ambulatory Visit: Payer: Self-pay | Admitting: Family Medicine

## 2018-08-23 DIAGNOSIS — G4700 Insomnia, unspecified: Secondary | ICD-10-CM

## 2018-08-23 DIAGNOSIS — G2581 Restless legs syndrome: Secondary | ICD-10-CM

## 2018-08-23 DIAGNOSIS — F331 Major depressive disorder, recurrent, moderate: Secondary | ICD-10-CM

## 2018-10-14 ENCOUNTER — Encounter: Payer: No Typology Code available for payment source | Admitting: Family Medicine

## 2018-10-28 ENCOUNTER — Encounter: Payer: Self-pay | Admitting: Family Medicine

## 2018-10-28 ENCOUNTER — Other Ambulatory Visit: Payer: Self-pay

## 2018-10-28 ENCOUNTER — Ambulatory Visit (INDEPENDENT_AMBULATORY_CARE_PROVIDER_SITE_OTHER): Payer: No Typology Code available for payment source | Admitting: Family Medicine

## 2018-10-28 VITALS — BP 122/80 | HR 93 | Temp 98.3°F | Ht 69.0 in | Wt 314.0 lb

## 2018-10-28 DIAGNOSIS — Z79899 Other long term (current) drug therapy: Secondary | ICD-10-CM

## 2018-10-28 DIAGNOSIS — F325 Major depressive disorder, single episode, in full remission: Secondary | ICD-10-CM

## 2018-10-28 DIAGNOSIS — G4700 Insomnia, unspecified: Secondary | ICD-10-CM

## 2018-10-28 DIAGNOSIS — Z9189 Other specified personal risk factors, not elsewhere classified: Secondary | ICD-10-CM

## 2018-10-28 DIAGNOSIS — Z13220 Encounter for screening for lipoid disorders: Secondary | ICD-10-CM | POA: Diagnosis not present

## 2018-10-28 DIAGNOSIS — N3943 Post-void dribbling: Secondary | ICD-10-CM

## 2018-10-28 DIAGNOSIS — R7303 Prediabetes: Secondary | ICD-10-CM | POA: Insufficient documentation

## 2018-10-28 DIAGNOSIS — G2581 Restless legs syndrome: Secondary | ICD-10-CM

## 2018-10-28 HISTORY — DX: Other specified personal risk factors, not elsewhere classified: Z91.89

## 2018-10-28 LAB — POCT URINALYSIS DIP (MANUAL ENTRY)
Bilirubin, UA: NEGATIVE
GLUCOSE UA: NEGATIVE mg/dL
Ketones, POC UA: NEGATIVE mg/dL
Leukocytes, UA: NEGATIVE
Nitrite, UA: NEGATIVE
PROTEIN UA: NEGATIVE mg/dL
Spec Grav, UA: 1.02 (ref 1.010–1.025)
Urobilinogen, UA: 0.2 E.U./dL
pH, UA: 6.5 (ref 5.0–8.0)

## 2018-10-28 LAB — POCT GLYCOSYLATED HEMOGLOBIN (HGB A1C): Hemoglobin A1C: 5.9 % — AB (ref 4.0–5.6)

## 2018-10-28 LAB — POCT UA - MICROSCOPIC ONLY

## 2018-10-28 NOTE — Patient Instructions (Signed)
Dr Hulon Ferron suspects that your urine dribbling is a sign of Benign Prostatic Hypertrophy (BPH).  This is a natural enlargement of the prostate with aging.   There is medication that can relax the muscles within the prostate, allowing the prostate to open more, allowing more urine to flow out.  This should decrease how much urine is left in your bladder after urinating, and therefore decrease your dribbling.    Dr Everette Mall will call you if your tests are not good. Otherwise he will send you a letter.  If you sign up for MyChart online, you will be able to see your test results once Dr Drema Eddington has reviewed them.  If you do not hear from [place] with in 2 weeks please call our office  Congratulations on stopping smoking!!!  Just let your pharmacy know when you need refills of your prescriptions.  Dr Ulyess Muto will refill them.

## 2018-10-29 ENCOUNTER — Encounter: Payer: Self-pay | Admitting: Family Medicine

## 2018-10-29 DIAGNOSIS — N3943 Post-void dribbling: Secondary | ICD-10-CM

## 2018-10-29 HISTORY — DX: Post-void dribbling: N39.43

## 2018-10-29 LAB — CMP14+EGFR
ALBUMIN: 4.5 g/dL (ref 3.5–5.5)
ALT: 43 IU/L (ref 0–44)
AST: 29 IU/L (ref 0–40)
Albumin/Globulin Ratio: 1.5 (ref 1.2–2.2)
Alkaline Phosphatase: 119 IU/L — ABNORMAL HIGH (ref 39–117)
BUN/Creatinine Ratio: 16 (ref 9–20)
BUN: 14 mg/dL (ref 6–24)
Bilirubin Total: 0.4 mg/dL (ref 0.0–1.2)
CO2: 24 mmol/L (ref 20–29)
Calcium: 9.9 mg/dL (ref 8.7–10.2)
Chloride: 99 mmol/L (ref 96–106)
Creatinine, Ser: 0.86 mg/dL (ref 0.76–1.27)
GFR calc Af Amer: 119 mL/min/{1.73_m2} (ref >59)
GFR calc non Af Amer: 103 mL/min/{1.73_m2} (ref >59)
GLUCOSE: 80 mg/dL (ref 65–99)
Globulin, Total: 3.1 g/dL (ref 1.5–4.5)
Potassium: 5 mmol/L (ref 3.5–5.2)
Sodium: 139 mmol/L (ref 134–144)
Total Protein: 7.6 g/dL (ref 6.0–8.5)

## 2018-10-29 LAB — LIPID PANEL
Chol/HDL Ratio: 3.4 ratio (ref 0.0–5.0)
Cholesterol, Total: 205 mg/dL — ABNORMAL HIGH (ref 100–199)
HDL: 60 mg/dL (ref >39)
LDL Calculated: 123 mg/dL — ABNORMAL HIGH (ref 0–99)
TRIGLYCERIDES: 111 mg/dL (ref 0–149)
VLDL Cholesterol Cal: 22 mg/dL (ref 5–40)

## 2018-10-29 NOTE — Assessment & Plan Note (Signed)
Prediabetes state Lab Results  Component Value Date   HGBA1C 5.9 (A) 10/28/2018   Discussed role lifestyle changes in prevention DM

## 2018-10-29 NOTE — Assessment & Plan Note (Signed)
Adequate symptom control. Tolerating Requip medication. Continue current medication regiment.

## 2018-10-29 NOTE — Assessment & Plan Note (Signed)
New problem. Hx of cystoscopy in past for persistent hematuria without sign findings per pt. Suspect BPH Checking urinalysis  May start Flomax

## 2018-10-29 NOTE — Progress Notes (Signed)
   Subjective:    Patient ID: Jonathan Rivas, male    DOB: 11-02-1971, 47 y.o.   MRN: [id number] Jonathan Rivas is alone Sources of clinical information for visit is/are patient and past medical records. Nursing assessment for this office visit was reviewed with the patient for accuracy and revision.  Previous Report(s) Reviewed: historical medical records  Depression screen [facility] 2/9 09/25/2017  Decreased Interest 0  Down, Depressed, Hopeless 0  PHQ - 2 Score 0  Altered sleeping -  Tired, decreased energy -  Change in appetite -  Feeling bad or failure about yourself  -  Trouble concentrating -  Moving slowly or fidgety/restless -  Suicidal thoughts -  PHQ-9 Score -   Fall Risk  02/08/2016  Falls in the past year? No     Adult vaccines due  Topic Date Due  . TETANUS/TDAP  09/25/2026   There are no preventive care reminders to display for this patient. There are no preventive care reminders to display for this patient.   HPI Problem List Items Addressed This Visit      High   Morbid obesity ([place]) - Regained prior lost weight after quitting smoking in 04/27/18. - Not watching his intact as previously.  Stopped attending gymnasium as he was - Return of DOE     Unprioritized   At risk for diabetes mellitus (Chronic) - No close FH - No polyuria   Relevant Orders   HgB A1c (Completed)   Depression - Primary - Major Depression event with death of brother around 10 years ago.  Attempted Suid=cide then.  Underwent inpatient and IOP therapy.  Started on Effexor then with good results. - No ahedonia or sad mood currently - PHQ9 = 0   Restless leg syndrome, controlled - Dx on PSG - Good control with Requip  - no LOC   Insomnia - LOngstanding issue - Infrequent use of trazodone.  Good response to 100 mg dose.     Other Visit Diagnoses    High risk medication use       Relevant Orders   CMP14+EGFR (Completed)   Post-void dribbling - Recent onset - no dysuria,  nocturia, frequency - Good stream, no hesitancy - occasional urge incontinence if waits too long to void - Dribbling stains pants - prefers not living with this issue.        Relevant Orders   POCT urinalysis dipstick (Completed)   POCT UA - Microscopic Only (Completed)   Screening cholesterol level       Relevant Orders   Lipid Panel (Completed)      SH: Quit smoking 04/27/18   Review of Systems See HPI    Objective:   Physical Exam VS reviewed GEN: Alert, Cooperative, Groomed, NAD HEENT: PERRL; EAC bilaterally not occluded, TM's translucent with normal LM, (+) LR;                No cervical LAN, No thyromegaly, No palpable masses COR: RRR, No M/G LUNGS: BCTA, No Acc mm use, speaking in full sentences  GU: Normal Rectal tone, Prostate 1+, no palpable masses, prostate without hypertrophy/asymmetry/nodularity. No bright red blood on DRE finger EXT: No peripheral leg edema SKIN: No lesion nor rashes of face/trunk/extremities  Gait: Normal speed, No significant path deviation, Step through +,  Psych: Normal affect/thought/speech/language    Assessment & Plan:  Visit Problem List with A/P  No problem-specific Assessment & Plan notes found for this encounter.

## 2018-10-29 NOTE — Assessment & Plan Note (Signed)
Adequate symptom control. Tolerating prn trazodone medication. Continue current medication regiment.

## 2018-10-29 NOTE — Assessment & Plan Note (Signed)
Established problem Controlled Continue current therapy regiment with Effexor Discussed with Mr Kimmons it would be up to him if he would want to come off antidepressant.  He understands he should have physician guided titration off Effexor if does choose to come off.

## 2018-10-29 NOTE — Assessment & Plan Note (Addendum)
Established problem worsened.  Jonathan Rivas knows what he needs to do.  He is in contemplative phase.  Encouraged him that he has taken control of his health with smoking and has lost weight before.   Checking lipids

## 2018-11-15 ENCOUNTER — Encounter: Payer: Self-pay | Admitting: Family Medicine

## 2018-11-19 ENCOUNTER — Other Ambulatory Visit: Payer: Self-pay

## 2018-11-19 DIAGNOSIS — G4700 Insomnia, unspecified: Secondary | ICD-10-CM

## 2018-11-19 DIAGNOSIS — F331 Major depressive disorder, recurrent, moderate: Secondary | ICD-10-CM

## 2018-11-19 DIAGNOSIS — G2581 Restless legs syndrome: Secondary | ICD-10-CM

## 2018-11-19 NOTE — Telephone Encounter (Signed)
Pt called nurse line requesting a refill on his medications. Medications need to be sent to the [facility] order.   Please advise.

## 2018-11-22 ENCOUNTER — Other Ambulatory Visit: Payer: Self-pay | Admitting: Family Medicine

## 2018-11-22 DIAGNOSIS — N3943 Post-void dribbling: Secondary | ICD-10-CM

## 2018-11-22 DIAGNOSIS — N401 Enlarged prostate with lower urinary tract symptoms: Secondary | ICD-10-CM

## 2018-11-22 MED ORDER — VENLAFAXINE HCL ER 150 MG PO CP24: ORAL_CAPSULE | ORAL | 3 refills | Status: DC

## 2018-11-22 MED ORDER — ALFUZOSIN HCL ER 10 MG PO TB24: 10.0000 mg | ORAL_TABLET | Freq: Every day | ORAL | 0 refills | Status: DC

## 2018-11-22 MED ORDER — ROPINIROLE HCL 2 MG PO TABS: 2.0000 mg | ORAL_TABLET | Freq: Every day | ORAL | 1 refills | Status: DC

## 2018-11-22 MED ORDER — TRAZODONE HCL 50 MG PO TABS: 50.0000 mg | ORAL_TABLET | Freq: Every evening | ORAL | 3 refills | Status: DC | PRN

## 2018-11-22 MED FILL — ALFUZOSIN HCL ER 10 MG TAB: 10 | 30 days supply | Qty: 30 | Fill #0

## 2018-11-22 MED FILL — rOPINIRole HCL 2 MG TABS: 2 | 90 days supply | Qty: 90 | Fill #0

## 2018-11-22 MED FILL — VENLAFAXINE HCL ER 150 MG C: 150 | 90 days supply | Qty: 90 | Fill #0

## 2018-11-22 MED FILL — traZODone HCL 50 MG TABS: 50 | 90 days supply | Qty: 180 | Fill #0

## 2018-11-22 NOTE — Addendum Note (Signed)
Addended byWendy Poet, TODD D on: 11/22/2018 11:42 AM   Modules accepted: Orders

## 2019-02-09 MED FILL — VENLAFAXINE HCL ER 150 MG C: 150 | 90 days supply | Qty: 90 | Fill #1

## 2019-02-09 MED FILL — rOPINIRole HCL 2 MG TABS: 2 | 90 days supply | Qty: 90 | Fill #1

## 2019-02-09 MED FILL — traZODone HCL 50 MG TABS: 50 | 90 days supply | Qty: 180 | Fill #1

## 2019-05-11 ENCOUNTER — Other Ambulatory Visit: Payer: Self-pay | Admitting: Family Medicine

## 2019-05-11 DIAGNOSIS — G2581 Restless legs syndrome: Secondary | ICD-10-CM

## 2019-05-11 DIAGNOSIS — N401 Enlarged prostate with lower urinary tract symptoms: Secondary | ICD-10-CM

## 2019-05-11 DIAGNOSIS — N3943 Post-void dribbling: Secondary | ICD-10-CM

## 2019-05-11 MED FILL — VENLAFAXINE HCL ER 150 MG C: 150 | 90 days supply | Qty: 90 | Fill #2

## 2019-05-11 MED FILL — traZODone HCL 50 MG TABS: 50 | 90 days supply | Qty: 180 | Fill #2

## 2019-05-12 MED FILL — ALFUZOSIN HCL ER 10 MG TAB: 10 | 30 days supply | Qty: 30 | Fill #0

## 2019-05-12 MED FILL — rOPINIRole HCL 2 MG TABS: 2 | 90 days supply | Qty: 90 | Fill #0

## 2019-05-12 NOTE — Telephone Encounter (Signed)
2nd request from pharmacy. Jazmin Hartsell,[name]

## 2019-06-09 ENCOUNTER — Encounter: Payer: Self-pay | Admitting: Family Medicine

## 2019-06-17 ENCOUNTER — Encounter: Payer: Self-pay | Admitting: Family Medicine

## 2019-06-21 ENCOUNTER — Encounter: Payer: Self-pay | Admitting: Family Medicine

## 2019-06-22 ENCOUNTER — Encounter: Payer: Self-pay | Admitting: Family Medicine

## 2019-06-22 DIAGNOSIS — E7800 Pure hypercholesterolemia, unspecified: Secondary | ICD-10-CM

## 2019-06-22 DIAGNOSIS — E785 Hyperlipidemia, unspecified: Secondary | ICD-10-CM

## 2019-06-22 HISTORY — DX: Hyperlipidemia, unspecified: E78.5

## 2019-06-22 HISTORY — DX: Pure hypercholesterolemia, unspecified: E78.00

## 2019-06-22 NOTE — Progress Notes (Signed)
MD Name: Blane Ohara Jeniyah Menor, MD   RE: Jonathan Rivas DOB: 09-Sep-1971  To Whom It May Concern:  Jonathan Rivas has been seen by our office for four years, he suffers from the following co-morbidities:  Past Medical History:  Diagnosis Date  . At risk for diabetes mellitus 10/28/2018  . Depression   . Elevated blood pressure reading 04/10/2015  . Erectile dysfunction   . Hyperlipidemia 06/22/2019  . Insomnia 12/28/2017  . Numbness and tingling in both hands 09/25/2016  . Obesity, morbid, BMI 40.0-49.9 ([place])   . Palpitations 09/25/2016    [name] most current office measurements as of 10/29/2019 are 314 lbs, Height 69 inches, BMI 46%.  The patient has undergone the following weight loss attempts: see attached form  I feel this patient would benefit from weight loss surgery because he has been unsuccessful losing weight with other diet methods and his medical conditions will become life threatening if he does not get his weight under control.  I appreciate your consideration.  Please contact me for further questions.  Sincerely,   Lissa Morales, MD  Fax'd to [phone]

## 2019-06-30 ENCOUNTER — Other Ambulatory Visit: Payer: Self-pay | Admitting: Surgery

## 2019-06-30 ENCOUNTER — Other Ambulatory Visit (HOSPITAL_COMMUNITY): Payer: Self-pay | Admitting: Surgery

## 2019-07-08 ENCOUNTER — Other Ambulatory Visit (HOSPITAL_COMMUNITY): Payer: Self-pay | Admitting: Surgery

## 2019-07-08 ENCOUNTER — Other Ambulatory Visit: Payer: Self-pay

## 2019-07-08 ENCOUNTER — Ambulatory Visit (HOSPITAL_COMMUNITY)
Admission: RE | Admit: 2019-07-08 | Discharge: 2019-07-08 | Disposition: A | Discharge status: Home / Self Care | Payer: No Typology Code available for payment source | Source: Ambulatory Visit | Attending: Surgery | Admitting: Surgery

## 2019-07-28 ENCOUNTER — Telehealth: Payer: No Typology Code available for payment source | Admitting: Physician Assistant

## 2019-07-28 DIAGNOSIS — J029 Acute pharyngitis, unspecified: Secondary | ICD-10-CM

## 2019-07-28 NOTE — Progress Notes (Signed)
I have spent 5 minutes in review of e-visit questionnaire, review and updating patient chart, medical decision making and response to patient.   Patrese Neal Cody Cataleah Stites, PA-C    

## 2019-07-28 NOTE — Progress Notes (Signed)

## 2019-08-11 MED FILL — rOPINIRole HCL 2 MG TABS: 2 | 90 days supply | Qty: 90 | Fill #1

## 2019-08-11 MED FILL — VENLAFAXINE HCL ER 150 MG C: 150 | 90 days supply | Qty: 90 | Fill #3

## 2019-08-11 MED FILL — ALFUZOSIN HCL ER 10 MG TB24: 10 | 30 days supply | Qty: 30 | Fill #1

## 2019-08-11 MED FILL — traZODone HCL 50 MG TABS: 50 | 90 days supply | Qty: 180 | Fill #3

## 2019-09-01 ENCOUNTER — Encounter: Payer: Self-pay | Admitting: Dietician

## 2019-09-01 ENCOUNTER — Other Ambulatory Visit: Payer: Self-pay

## 2019-09-01 ENCOUNTER — Encounter: Payer: No Typology Code available for payment source | Attending: Surgery | Admitting: Dietician

## 2019-09-01 DIAGNOSIS — E669 Obesity, unspecified: Secondary | ICD-10-CM | POA: Diagnosis not present

## 2019-09-01 NOTE — Progress Notes (Signed)
Nutrition Assessment for Bariatric Surgery Medical Nutrition Therapy  Appt Start Time: 2:00pm    End Time: 2:45pm  Patient was seen on 09/01/2019 for Pre-Operative Nutrition Assessment. Letter of approval faxed to [facility] bariatric surgery program coordinator on 09/01/2019.   Referral stated Supervised Weight Loss (SWL) visits needed: 0  Planned surgery: Sleeve  Pt expectation of surgery: to get healthy and able to move more without feeling winded  Pt expectation of dietitian: dietary guidance    NUTRITION ASSESSMENT   Anthropometrics  Start weight at [facility]: 325.5 lbs (date: 09/01/2019) Height: 70 in BMI: 46.7 kg/m2    Clinical  Medical hx: obesity, depression  Medications: Effexor XR, ropinirole, trazodone, alfuzosin, aspirin, loratadine    Lifestyle & Dietary Hx (living situation, sleep regimen, functional ability, weight hx, current dietary patterns, fluid intake, supplements, physical activity, etc) Patient is personable and seems well prepared for surgery. 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 Lives with his best friend. Current smoker, states he has cut back to 5 cigarettes/day. Hx of smoking and previously quit, however states he picked it back up since having to work from home d/t coronavirus.   Usually skips breakfast, has coffee only. Lunch is packed or takeout (ex: McDonalds.) Typical meal pattern is 2 meals/day plus a dessert after dinner. Estimates drinking 80 oz fluid at most per day. States he would like to work on eating more frequent meals throughout the day.     24-Hr Dietary Recall First Meal: skips  Snack: skips  Second Meal: sandwich (meat + bread) + chips + yogurt  Snack: skips  Third Meal: meat + veggies + mashed potatoes  Snack: ice cream (or cookies)  Beverages: coffee (half & half, Splenda), water w/ [place], caffeine free diet Pepsi   Estimated Energy Needs Calories: 2000 Carbohydrate: 225g Protein: 125g Fat:  6g   NUTRITION DIAGNOSIS  Overweight/obesity ([place]-3.3) related to past poor dietary habits and physical inactivity as evidenced by patient w/ planned Sleeve Gastrectomy surgery following dietary guidelines for continued weight loss.    NUTRITION INTERVENTION  Nutrition counseling (C-1) and education (E-2) to facilitate bariatric surgery goals.  Pre-Op Goals Reviewed with the Patient . Track food and beverage intake (pen and paper, MyFitness Pal, Baritastic app, etc.) . Make healthy food choices while monitoring portion sizes . Consume 3 meals per day or try to eat every 3-5 hours . Avoid concentrated sugars and fried foods . Keep sugar & fat in the single digits per serving on food labels . Practice CHEWING your food (aim for applesauce consistency) . Practice not drinking 15 minutes before, during, and 30 minutes after each meal and snack . Avoid all carbonated beverages (ex: soda, sparkling beverages)  . Limit caffeinated beverages (ex: coffee, tea, energy drinks) . Avoid all sugar-sweetened beverages (ex: regular soda, sports drinks)  . Avoid alcohol  . Aim for 64-100 ounces of FLUID daily (with at least half of fluid intake being plain water)  . Aim for at least 60-80 grams of PROTEIN daily . Look for a liquid protein source that contains ?15 g protein and ?5 g carbohydrate (ex: shakes, drinks, shots) . Make a list of non-food related activities . Physical activity is an important part of a healthy lifestyle so keep it moving! The goal is to reach 150 minutes of exercise per week, including cardiovascular and weight baring activity.  Handouts Provided Include  . Bariatric Surgery handouts (Nutrition Visits, Pre-Op Goals, Protein Shakes, Vitamins & Minerals)  Learning  Style & Readiness for Change Teaching method utilized: Visual & Auditory  Demonstrated degree of understanding via: Teach Back  Barriers to learning/adherence to lifestyle change: None Identified   MONITORING &  EVALUATION Dietary intake, weekly physical activity, body weight, and pre-op goals reached at next nutrition visit.   Next Steps Patient is to call [facility] to enroll in Pre-Op Class (>2 weeks before surgery) and Post-Op Class (2 weeks after surgery) for further nutrition education when surgery date is scheduled.

## 2019-09-13 MED FILL — ALFUZOSIN HCL ER 10 MG TB24: 10 | 90 days supply | Qty: 90 | Fill #2

## 2019-09-27 ENCOUNTER — Ambulatory Visit (INDEPENDENT_AMBULATORY_CARE_PROVIDER_SITE_OTHER): Payer: No Typology Code available for payment source | Admitting: Professional

## 2019-10-14 ENCOUNTER — Ambulatory Visit (INDEPENDENT_AMBULATORY_CARE_PROVIDER_SITE_OTHER): Payer: No Typology Code available for payment source | Admitting: Professional

## 2019-10-14 DIAGNOSIS — F4320 Adjustment disorder, unspecified: Secondary | ICD-10-CM | POA: Diagnosis not present

## 2019-10-24 ENCOUNTER — Other Ambulatory Visit: Payer: Self-pay

## 2019-10-24 ENCOUNTER — Encounter: Payer: Self-pay | Admitting: Dietician

## 2019-10-24 ENCOUNTER — Encounter: Payer: No Typology Code available for payment source | Attending: Surgery | Admitting: Dietician

## 2019-10-24 DIAGNOSIS — E669 Obesity, unspecified: Secondary | ICD-10-CM | POA: Insufficient documentation

## 2019-10-24 NOTE — Progress Notes (Signed)
Pre-Operative Nutrition Class   Appt Start Time: 8:15am     End Time: 9:45am   Patient was seen on 10/24/2019 for Pre-Operative Bariatric Surgery Education at [facility].    Surgery date: (estimated January 2021) Surgery type: Sleeve   Start weight at [facility]: 325.5 lbs (date: 09/01/2019) Weight today: 328.5 lbs BMI: 47 kg/m2   Samples Given per MNT Protocol (pt educated on appropriate usage) Multivitamin: Bariatric Advantage Chewable Lot #[id number] Exp: 01/22   Calcium: Celebrate Soft Chew  Lot #[id number] Exp: 12/21   Protein Drink: Protein 2O Lot #[id number] CCP 0269  Exp: 02/15/21  Celebrate High Protein Meal Replacement  Lot #[id number]  Exp: 08/10/20    The following the learning objectives were met by the patient during this course:  Identify Pre-Op Dietary Goals and will begin 2 weeks pre-operatively  Identify appropriate sources of fluids and proteins   State protein recommendations and appropriate sources pre and post-operatively  Identify Post-Operative Dietary Goals and will follow for 2 weeks post-operatively  Identify appropriate multivitamin and calcium sources  Describe the need for physical activity post-operatively and will follow MD recommendations  State when to call healthcare provider regarding medication questions or post-operative complications   Handouts given include:  Pre-Op Bariatric Surgery Diet Handout  Protein Shake Handout  Post-Op Bariatric Surgery Nutrition Handout  [facility] Program Information Flyer  Support Group Information Flyer  [facility] Bariatric Supplements Price List   Follow-Up Plan: Patient will follow-up at [facility] 2 weeks post operatively for diet advancement per MD.

## 2019-11-04 ENCOUNTER — Ambulatory Visit: Payer: Self-pay | Admitting: Surgery

## 2019-11-09 ENCOUNTER — Other Ambulatory Visit: Payer: Self-pay | Admitting: Family Medicine

## 2019-11-09 DIAGNOSIS — G2581 Restless legs syndrome: Secondary | ICD-10-CM

## 2019-11-09 DIAGNOSIS — G4700 Insomnia, unspecified: Secondary | ICD-10-CM

## 2019-11-09 DIAGNOSIS — F331 Major depressive disorder, recurrent, moderate: Secondary | ICD-10-CM

## 2019-11-09 MED FILL — traZODone HCL 50 MG TABS: 50 | 90 days supply | Qty: 180 | Fill #0

## 2019-11-09 MED FILL — rOPINIRole HCL 2 MG TABS: 2 | 90 days supply | Qty: 90 | Fill #0

## 2019-11-09 MED FILL — VENLAFAXINE HCL ER 150 MG C: 150 | 90 days supply | Qty: 90 | Fill #0

## 2019-11-16 NOTE — Patient Instructions (Addendum)
DUE TO COVID-19 ONLY ONE VISITOR IS ALLOWED TO COME WITH YOU AND STAY IN THE WAITING ROOM ONLY DURING PRE OP AND PROCEDURE DAY OF SURGERY. THE 1 VISITOR MAY VISIT WITH YOU AFTER SURGERY IN YOUR PRIVATE ROOM DURING VISITING HOURS ONLY!  YOU NEED TO HAVE A COVID 19 TEST ON__Thursday 12/31/2020_____ @__1040  am_____, THIS TEST MUST BE DONE BEFORE SURGERY, COME  [address], [place] , [address].  ([place]) ONCE YOUR COVID TEST IS COMPLETED, PLEASE BEGIN THE QUARANTINE INSTRUCTIONS AS OUTLINED IN YOUR HANDOUT.                ZEPLIN GIANELLI     Your procedure is scheduled [id number] 11/28/2019    Report to [facility] Main  Entrance    Report to Short Stay at  [facility]  AM     Call this number if you have problems the morning of surgery [phone]    Remember: Do not eat food  :After  600 pm   MORNING OF SURGERY DRINK:   DRINK 1 G2 drink BEFORE YOU LEAVE HOME, DRINK ALL OF THE  G2 DRINK AT ONE TIME.    NO SOLID FOOD AFTER 600 PM THE NIGHT BEFORE YOUR SURGERY. YOU MAY DRINK CLEAR FLUIDS.   THE G2 DRINK YOU DRINK BEFORE YOU LEAVE HOME WILL BE THE LAST FLUIDS YOU DRINK BEFORE SURGERY.  PAIN IS EXPECTED AFTER SURGERY AND WILL NOT BE COMPLETELY ELIMINATED. AMBULATION AND TYLENOL WILL HELP REDUCE INCISIONAL AND GAS PAIN. MOVEMENT IS KEY!  YOU ARE EXPECTED TO BE OUT OF BED WITHIN 4 HOURS OF ADMISSION TO YOUR PATIENT ROOM.  SITTING IN THE RECLINER THROUGHOUT THE DAY IS IMPORTANT FOR DRINKING FLUIDS AND MOVING GAS THROUGHOUT THE GI TRACT.  COMPRESSION STOCKINGS SHOULD BE WORN [place] UNLESS YOU ARE WALKING.   INCENTIVE SPIROMETER SHOULD BE USED EVERY HOUR WHILE AWAKE TO DECREASE POST-OPERATIVE COMPLICATIONS SUCH AS PNEUMONIA.  WHEN DISCHARGED HOME, IT IS IMPORTANT TO CONTINUE TO WALK EVERY HOUR AND USE THE INCENTIVE SPIROMETER EVERY HOUR.      CLEAR LIQUID DIET   Foods Allowed                                                                      Foods Excluded  Coffee and tea, regular and decaf                             liquids that you cannot  Plain Jell-O any favor except red or purple                                           see through such as: Fruit ices (not with fruit pulp)                                     milk, soups, orange juice  Iced Popsicles  All solid food Carbonated beverages, regular and diet                                    Cranberry, grape and apple juices Sports drinks like Gatorade Lightly seasoned clear broth or consume(fat free) Sugar, honey syrup  Sample Menu Breakfast                                Lunch                                     Supper Cranberry juice                    Beef broth                            Chicken broth Jell-O                                     Grape juice                           Apple juice Coffee or tea                        Jell-O                                      Popsicle                                                Coffee or tea                        Coffee or tea  _____________________________________________________________________      BRUSH YOUR TEETH MORNING OF SURGERY AND RINSE YOUR MOUTH OUT, NO CHEWING GUM CANDY OR MINTS.     Take these medicines the morning of surgery with A SIP OF WATER: Venlafaxine XR (Effexor XR)                                You may not have any metal on your body including hair pins and              piercings  Do not wear jewelry, make-up, lotions, powders or perfumes, deodorant                           Men may shave face and neck.   Do not bring valuables to the hospital. [place].  Contacts, dentures or bridgework may not be worn into surgery.  Leave suitcase in the car. After surgery it may be brought to your room.  Please read over the following fact sheets you were  given: _____________________________________________________________________             [facility] - Preparing for Surgery Before surgery, you can play an important role.  Because skin is not sterile, your skin needs to be as free of germs as possible.  You can reduce the number of germs on your skin by washing with CHG (chlorahexidine gluconate) soap before surgery.  CHG is an antiseptic cleaner which kills germs and bonds with the skin to continue killing germs even after washing. Please DO NOT use if you have an allergy to CHG or antibacterial soaps.  If your skin becomes reddened/irritated stop using the CHG and inform your nurse when you arrive at Short Stay. Do not shave (including legs and underarms) for at least 48 hours prior to the first CHG shower.  You may shave your face/neck. Please follow these instructions carefully:  1.  Shower with CHG Soap the night before surgery and the  morning of Surgery.  2.  If you choose to wash your hair, wash your hair first as usual with your  normal  shampoo.  3.  After you shampoo, rinse your hair and body thoroughly to remove the  shampoo.                           4.  Use CHG as you would any other liquid soap.  You can apply chg directly  to the skin and wash                       Gently with a scrungie or clean washcloth.  5.  Apply the CHG Soap to your body ONLY FROM THE NECK DOWN.   Do not use on face/ open                           Wound or open sores. Avoid contact with eyes, ears mouth and genitals (private parts).                       Wash face,  Genitals (private parts) with your normal soap.             6.  Wash thoroughly, paying special attention to the area where your surgery  will be performed.  7.  Thoroughly rinse your body with warm water from the neck down.  8.  DO NOT shower/wash with your normal soap after using and rinsing off  the CHG Soap.                9.  Pat yourself dry with a clean towel.            10.  Wear  clean pajamas.            11.  Place clean sheets on your bed the night of your first shower and do not  sleep with pets. Day of Surgery : Do not apply any lotions/deodorants the morning of surgery.  Please wear clean clothes to the hospital/surgery center.  FAILURE TO FOLLOW THESE INSTRUCTIONS MAY RESULT IN THE CANCELLATION OF YOUR SURGERY PATIENT SIGNATURE_________________________________  NURSE SIGNATURE__________________________________  ________________________________________________________________________   Adam Phenix  An incentive spirometer is a tool that can help keep your lungs clear and active. This tool measures how well you are filling your lungs with each breath. Taking  long deep breaths may help reverse or decrease the chance of developing breathing (pulmonary) problems (especially infection) following:  A long period of time when you are unable to move or be active. BEFORE THE PROCEDURE   If the spirometer includes an indicator to show your best effort, your nurse or respiratory therapist will set it to a desired goal.  If possible, sit up straight or lean slightly forward. Try not to slouch.  Hold the incentive spirometer in an upright position. INSTRUCTIONS FOR USE  1. Sit on the edge of your bed if possible, or sit up as far as you can in bed or on a chair. 2. Hold the incentive spirometer in an upright position. 3. Breathe out normally. 4. Place the mouthpiece in your mouth and seal your lips tightly around it. 5. Breathe in slowly and as deeply as possible, raising the piston or the ball toward the top of the column. 6. Hold your breath for 3-5 seconds or for as long as possible. Allow the piston or ball to fall to the bottom of the column. 7. Remove the mouthpiece from your mouth and breathe out normally. 8. Rest for a few seconds and repeat Steps 1 through 7 at least 10 times every 1-2 hours when you are awake. Take your time and take a few normal  breaths between deep breaths. 9. The spirometer may include an indicator to show your best effort. Use the indicator as a goal to work toward during each repetition. 10. After each set of 10 deep breaths, practice coughing to be sure your lungs are clear. If you have an incision (the cut made at the time of surgery), support your incision when coughing by placing a pillow or rolled up towels firmly against it. Once you are able to get out of bed, walk around indoors and cough well. You may stop using the incentive spirometer when instructed by your caregiver.  RISKS AND COMPLICATIONS  Take your time so you do not get dizzy or light-headed.  If you are in pain, you may need to take or ask for pain medication before doing incentive spirometry. It is harder to take a deep breath if you are having pain. AFTER USE  Rest and breathe slowly and easily.  It can be helpful to keep track of a log of your progress. Your caregiver can provide you with a simple table to help with this. If you are using the spirometer at home, follow these instructions: [place] IF:   You are having difficultly using the spirometer.  You have trouble using the spirometer as often as instructed.  Your pain medication is not giving enough relief while using the spirometer.  You develop fever of 100.5 F (38.1 C) or higher. SEEK IMMEDIATE MEDICAL CARE IF:   You cough up bloody sputum that had not been present before.  You develop fever of 102 F (38.9 C) or greater.  You develop worsening pain at or near the incision site. MAKE SURE YOU:   Understand these instructions.  Will watch your condition.  Will get help right away if you are not doing well or get worse. Document Released: 03/23/2007 Document Revised: 02/02/2012 Document Reviewed: 05/24/2007 ExitCare Patient Information 2014 ExitCare, [place].   ________________________________________________________________________  WHAT IS A BLOOD  TRANSFUSION? Blood Transfusion Information  A transfusion is the replacement of blood or some of its parts. Blood is made up of multiple cells which provide different functions.  Red blood cells carry oxygen  and are used for blood loss replacement.  White blood cells fight against infection.  Platelets control bleeding.  Plasma helps clot blood.  Other blood products are available for specialized needs, such as hemophilia or other clotting disorders. BEFORE THE TRANSFUSION  Who gives blood for transfusions?   Healthy volunteers who are fully evaluated to make sure their blood is safe. This is blood bank blood. Transfusion therapy is the safest it has ever been in the practice of medicine. Before blood is taken from a donor, a complete history is taken to make sure that person has no history of diseases nor engages in risky social behavior (examples are intravenous drug use or sexual activity with multiple partners). The donor's travel history is screened to minimize risk of transmitting infections, such as malaria. The donated blood is tested for signs of infectious diseases, such as HIV and hepatitis. The blood is then tested to be sure it is compatible with you in order to minimize the chance of a transfusion reaction. If you or a relative donates blood, this is often done in anticipation of surgery and is not appropriate for emergency situations. It takes many days to process the donated blood. RISKS AND COMPLICATIONS Although transfusion therapy is very safe and saves many lives, the main dangers of transfusion include:   Getting an infectious disease.  Developing a transfusion reaction. This is an allergic reaction to something in the blood you were given. Every precaution is taken to prevent this. The decision to have a blood transfusion has been considered carefully by your caregiver before blood is given. Blood is not given unless the benefits outweigh the risks. AFTER THE  TRANSFUSION  Right after receiving a blood transfusion, you will usually feel much better and more energetic. This is especially true if your red blood cells have gotten low (anemic). The transfusion raises the level of the red blood cells which carry oxygen, and this usually causes an energy increase.  The nurse administering the transfusion will monitor you carefully for complications. HOME CARE INSTRUCTIONS  No special instructions are needed after a transfusion. You may find your energy is better. Speak with your caregiver about any limitations on activity for underlying diseases you may have. SEEK MEDICAL CARE IF:   Your condition is not improving after your transfusion.  You develop redness or irritation at the intravenous (IV) site. SEEK IMMEDIATE MEDICAL CARE IF:  Any of the following symptoms occur over the next 12 hours:  Shaking chills.  You have a temperature by mouth above 102 F (38.9 C), not controlled by medicine.  Chest, back, or muscle pain.  People around you feel you are not acting correctly or are confused.  Shortness of breath or difficulty breathing.  Dizziness and fainting.  You get a rash or develop hives.  You have a decrease in urine output.  Your urine turns a dark color or changes to pink, red, or brown. Any of the following symptoms occur over the next 10 days:  You have a temperature by mouth above 102 F (38.9 C), not controlled by medicine.  Shortness of breath.  Weakness after normal activity.  The white part of the eye turns yellow (jaundice).  You have a decrease in the amount of urine or are urinating less often.  Your urine turns a dark color or changes to pink, red, or brown. Document Released: 11/07/2000 Document Revised: 02/02/2012 Document Reviewed: 06/26/2008 [facility] Patient Information 2014 [place], [place].  _______________________________________________________________________

## 2019-11-22 ENCOUNTER — Encounter (HOSPITAL_COMMUNITY): Payer: Self-pay

## 2019-11-22 ENCOUNTER — Encounter (HOSPITAL_COMMUNITY)
Admission: RE | Admit: 2019-11-22 | Discharge: 2019-11-22 | Disposition: A | Discharge status: Home / Self Care | Payer: No Typology Code available for payment source | Source: Ambulatory Visit | Attending: Surgery | Admitting: Surgery

## 2019-11-22 ENCOUNTER — Other Ambulatory Visit: Payer: Self-pay

## 2019-11-22 DIAGNOSIS — Z01812 Encounter for preprocedural laboratory examination: Secondary | ICD-10-CM | POA: Diagnosis present

## 2019-11-22 LAB — COMPREHENSIVE METABOLIC PANEL
ALT: 47 U/L — ABNORMAL HIGH (ref 0–44)
AST: 29 U/L (ref 15–41)
Albumin: 4.2 g/dL (ref 3.5–5.0)
Alkaline Phosphatase: 89 U/L (ref 38–126)
Anion gap: 10 (ref 5–15)
BUN: 17 mg/dL (ref 6–20)
CO2: 24 mmol/L (ref 22–32)
Calcium: 9.1 mg/dL (ref 8.9–10.3)
Chloride: 103 mmol/L (ref 98–111)
Creatinine, Ser: 0.75 mg/dL (ref 0.61–1.24)
GFR calc Af Amer: >60 mL/min (ref >60)
GFR calc non Af Amer: >60 mL/min (ref >60)
Glucose, Bld: 100 mg/dL — ABNORMAL HIGH (ref 70–99)
Potassium: 3.9 mmol/L (ref 3.5–5.1)
Sodium: 137 mmol/L (ref 135–145)
Total Bilirubin: 0.6 mg/dL (ref 0.3–1.2)
Total Protein: 8 g/dL (ref 6.5–8.1)

## 2019-11-22 LAB — HEMOGLOBIN A1C
Hgb A1c MFr Bld: 5.5 % (ref 4.8–5.6)
Mean Plasma Glucose: 111.15 mg/dL

## 2019-11-22 LAB — CBC WITH DIFFERENTIAL/PLATELET
Abs Immature Granulocytes: 0.05 10*3/uL (ref 0.00–0.07)
Basophils Absolute: 0.1 10*3/uL (ref 0.0–0.1)
Basophils Relative: 1 %
Eosinophils Absolute: 0.2 10*3/uL (ref 0.0–0.5)
Eosinophils Relative: 2 %
HCT: 48.1 % (ref 39.0–52.0)
Hemoglobin: 15.2 g/dL (ref 13.0–17.0)
Immature Granulocytes: 0 %
Lymphocytes Relative: 24 %
Lymphs Abs: 2.8 10*3/uL (ref 0.7–4.0)
MCH: 29.5 pg (ref 26.0–34.0)
MCHC: 31.6 g/dL (ref 30.0–36.0)
MCV: 93.2 fL (ref 80.0–100.0)
Monocytes Absolute: 0.8 10*3/uL (ref 0.1–1.0)
Monocytes Relative: 7 %
Neutro Abs: 7.9 10*3/uL — ABNORMAL HIGH (ref 1.7–7.7)
Neutrophils Relative %: 66 %
Platelets: 241 10*3/uL (ref 150–400)
RBC: 5.16 MIL/uL (ref 4.22–5.81)
RDW: 13.9 % (ref 11.5–15.5)
WBC: 11.9 10*3/uL — ABNORMAL HIGH (ref 4.0–10.5)
nRBC: 0 % (ref 0.0–0.2)

## 2019-11-22 LAB — GLUCOSE, CAPILLARY: Glucose-Capillary: 112 mg/dL — ABNORMAL HIGH (ref 70–99)

## 2019-11-22 LAB — ABO/RH: ABO/RH(D): O NEG

## 2019-11-22 NOTE — Progress Notes (Signed)
PCP - Dr. Sherren Mocha McDiarmid Cardiologist - 10/01/2016  Dr. Marlou Porch saw him for palpitations and exertional dyspnea and F/U as needed  Chest x-ray - 07/08/19 EPIC EKG - 07/08/2019 EPIC Stress Test - more than 10 years ago ECHO - more than 10 years ago Cardiac Cath - 10/03/2016 done for c/o exertional dyspnea PCP referred him to Dr. Marlou Porch  Sleep Study - 10 years ago, was Diagnosed with restless leg syndrome CPAP - No  Fasting Blood Sugar - Pre-Diabetes  Does not check glucose Checks Blood Sugar _0____ times a day  Blood Thinner Instructions:n/a Aspirin Instructions:Aspirin 81 mg for preventative treatment by PCP Last Dose:11/13/2019  Anesthesia review:   Patient denies shortness of breath, fever, cough and chest pain at PAT appointment   Patient verbalized understanding of instructions that were given to them at the PAT appointment. Patient was also instructed that they will need to review over the PAT instructions again at home before surgery.

## 2019-11-24 ENCOUNTER — Other Ambulatory Visit (HOSPITAL_COMMUNITY)
Admission: RE | Admit: 2019-11-24 | Discharge: 2019-11-24 | Disposition: A | Discharge status: Home / Self Care | Payer: No Typology Code available for payment source | Source: Ambulatory Visit | Attending: Surgery | Admitting: Surgery

## 2019-11-24 DIAGNOSIS — Z01812 Encounter for preprocedural laboratory examination: Secondary | ICD-10-CM | POA: Insufficient documentation

## 2019-11-24 DIAGNOSIS — Z20828 Contact with and (suspected) exposure to other viral communicable diseases: Secondary | ICD-10-CM | POA: Diagnosis not present

## 2019-11-25 LAB — NOVEL CORONAVIRUS, NAA (HOSP ORDER, SEND-OUT TO REF LAB; TAT 18-24 HRS): SARS-CoV-2, NAA: NOT DETECTED

## 2019-11-27 MED ORDER — BUPIVACAINE LIPOSOME 1.3 % IJ SUSP
20.0000 mL | INTRAMUSCULAR | Status: DC
  Filled 2019-11-27: qty 20

## 2019-11-27 NOTE — H&P (Addendum)
Jonathan Rivas Appointment: 06/22/2019 11:00 AM Location: [place] Office Patient #: [id number] DOB: Oct 26, 1971 Single / Language: Jonathan Rivas / Race: White Male   History of Present Illness Jonathan Key B. Hassell Done MD; 06/22/2019 11:43 AM) The patient is a 49 year old male who presents for a bariatric surgery evaluation. He presents having been on our on line seminar. He has had lifelong obesity and has researched bariatric surgery and is interested in sleeve gastrectomy. He denies DM or GERD. He weighs 319 and has a BMI of 46. He has had no prior abdominal surgery and no hx of DVT. He was tested for OSA about 10 years ago and was negative except for restless legs.   I have explained sleeve gastrectomy to him the with chart in some detail. He is aware of the risks. Will begin workup for sleeve gastrectomy. He works in Engineer, mining at [employer].    Past Surgical History [name]; 06/21/2019 2:51 PM) No pertinent past surgical history   Diagnostic Studies History [name]; 06/21/2019 2:51 PM) Colonoscopy  never  Allergies [name], [name]; 06/22/2019 11:04 AM) No Known Drug Allergies [06/22/2019]:  Medication History ([name], [name]; 06/22/2019 11:05 AM) Effexor XR (150MG  Capsule ER 24HR, Oral) Active. rOPINIRole HCl (2MG  Tablet, Oral) Active. traZODone HCl (50MG  Tablet, Oral) Active. Alfuzosin HCl (10MG  Tablet ER, Oral) Active. Aspirin (81MG  Tablet, Oral) Active. Loratadine (10MG  Tablet, Oral) Active. Multivitamin (Oral) Active. Medications Reconciled  Social History [name]; 06/21/2019 2:51 PM) Alcohol use  Occasional alcohol use. No drug use  Tobacco use  Current some day smoker.  Family History [name]; 06/21/2019 2:51 PM) Alcohol Abuse  Brother, Family Members In General. Cancer  Mother. Heart Disease  Family Members In General, Father. Heart disease in male family member before age 60   Other Problems [name];  06/21/2019 2:51 PM) Depression   Vitals ([name]; 06/22/2019 11:03 AM) 06/22/2019 11:03 AM Weight: 319.4 lb Height: 70in Body Surface Area: 2.55 m Body Mass Index: 45.83 kg/m  Pulse: 88 (Regular)  BP: 136/80 (Sitting, Left Arm, Standard)       Physical Exam  The physical exam findings are as follows: Note:Obese WM NAD with metabolic syndrome habitus HEENT unremarkable Neck supple chest clear Heart SR Abdomen nontender and no masses-no prior surgery Ext FROM    Assessment & Plan Jonathan Key B. Hassell Done MD; 06/22/2019 11:45 AM) MORBID OBESITY, UNSPECIFIED OBESITY TYPE (E66.01) no history of GERD.   For sleeve gastrectomy on Nov 28, 2019  [name], MD, FACS

## 2019-11-28 ENCOUNTER — Encounter (HOSPITAL_COMMUNITY)
Admission: RE | Disposition: A | Discharge status: Home / Self Care | Payer: Self-pay | Source: Home / Self Care | Attending: Surgery

## 2019-11-28 ENCOUNTER — Inpatient Hospital Stay (HOSPITAL_COMMUNITY)
Admission: RE | Admit: 2019-11-28 | Discharge: 2019-11-29 | DRG: 621 | Disposition: A | Discharge status: Home / Self Care | Payer: No Typology Code available for payment source | Attending: Surgery | Admitting: Surgery

## 2019-11-28 ENCOUNTER — Inpatient Hospital Stay (HOSPITAL_COMMUNITY): Payer: No Typology Code available for payment source | Admitting: Anesthesiology

## 2019-11-28 ENCOUNTER — Other Ambulatory Visit: Payer: Self-pay

## 2019-11-28 ENCOUNTER — Inpatient Hospital Stay (HOSPITAL_COMMUNITY): Payer: No Typology Code available for payment source | Admitting: Physician Assistant

## 2019-11-28 ENCOUNTER — Encounter (HOSPITAL_COMMUNITY): Payer: Self-pay | Admitting: Surgery

## 2019-11-28 DIAGNOSIS — Z886 Allergy status to analgesic agent status: Secondary | ICD-10-CM

## 2019-11-28 DIAGNOSIS — Z6841 Body Mass Index (BMI) 40.0 and over, adult: Secondary | ICD-10-CM | POA: Diagnosis not present

## 2019-11-28 DIAGNOSIS — F17200 Nicotine dependence, unspecified, uncomplicated: Secondary | ICD-10-CM | POA: Diagnosis present

## 2019-11-28 DIAGNOSIS — Z888 Allergy status to other drugs, medicaments and biological substances status: Secondary | ICD-10-CM | POA: Diagnosis not present

## 2019-11-28 DIAGNOSIS — M79605 Pain in left leg: Secondary | ICD-10-CM | POA: Diagnosis not present

## 2019-11-28 DIAGNOSIS — Z9884 Bariatric surgery status: Secondary | ICD-10-CM

## 2019-11-28 DIAGNOSIS — Z9889 Other specified postprocedural states: Secondary | ICD-10-CM | POA: Diagnosis not present

## 2019-11-28 DIAGNOSIS — Z5321 Procedure and treatment not carried out due to patient leaving prior to being seen by health care provider: Secondary | ICD-10-CM | POA: Diagnosis not present

## 2019-11-28 DIAGNOSIS — M79604 Pain in right leg: Secondary | ICD-10-CM | POA: Diagnosis present

## 2019-11-28 HISTORY — PX: LAPAROSCOPIC GASTRIC SLEEVE RESECTION: SHX5895

## 2019-11-28 LAB — TYPE AND SCREEN
ABO/RH(D): O NEG
Antibody Screen: NEGATIVE

## 2019-11-28 LAB — CREATININE, SERUM
Creatinine, Ser: 1.02 mg/dL (ref 0.61–1.24)
GFR calc Af Amer: >60 mL/min (ref >60)
GFR calc non Af Amer: >60 mL/min (ref >60)

## 2019-11-28 LAB — CBC
HCT: 47.9 % (ref 39.0–52.0)
Hemoglobin: 14.9 g/dL (ref 13.0–17.0)
MCH: 29.7 pg (ref 26.0–34.0)
MCHC: 31.1 g/dL (ref 30.0–36.0)
MCV: 95.6 fL (ref 80.0–100.0)
Platelets: 220 10*3/uL (ref 150–400)
RBC: 5.01 MIL/uL (ref 4.22–5.81)
RDW: 14.2 % (ref 11.5–15.5)
WBC: 14.8 10*3/uL — ABNORMAL HIGH (ref 4.0–10.5)
nRBC: 0 % (ref 0.0–0.2)

## 2019-11-28 LAB — HEMOGLOBIN AND HEMATOCRIT, BLOOD
HCT: 47 % (ref 39.0–52.0)
Hemoglobin: 14.6 g/dL (ref 13.0–17.0)

## 2019-11-28 SURGERY — GASTRECTOMY, SLEEVE, LAPAROSCOPIC
Anesthesia: General | Site: Abdomen

## 2019-11-28 MED ORDER — ONDANSETRON HCL 4 MG/2ML IJ SOLN
INTRAMUSCULAR | Status: DC | PRN
  Administered 2019-11-28: 4 mg via INTRAVENOUS

## 2019-11-28 MED ORDER — FENTANYL CITRATE (PF) 100 MCG/2ML IJ SOLN
25.0000 ug | INTRAMUSCULAR | Status: DC | PRN
  Administered 2019-11-28 (×2): 50 ug via INTRAVENOUS

## 2019-11-28 MED ORDER — EPHEDRINE SULFATE-NACL 50-0.9 MG/10ML-% IV SOSY
PREFILLED_SYRINGE | INTRAVENOUS | Status: DC | PRN
  Administered 2019-11-28 (×2): 10 mg via INTRAVENOUS

## 2019-11-28 MED ORDER — DEXAMETHASONE SODIUM PHOSPHATE 10 MG/ML IJ SOLN
INTRAMUSCULAR | Status: DC | PRN
  Administered 2019-11-28: 10 mg via INTRAVENOUS

## 2019-11-28 MED ORDER — ACETAMINOPHEN 500 MG PO TABS
1000.0000 mg | ORAL_TABLET | ORAL | Status: AC
  Administered 2019-11-28: 1000 mg via ORAL
  Filled 2019-11-28: qty 2

## 2019-11-28 MED ORDER — MIDAZOLAM HCL 2 MG/2ML IJ SOLN
INTRAMUSCULAR | Status: AC
  Filled 2019-11-28: qty 2

## 2019-11-28 MED ORDER — LIDOCAINE 2% (20 MG/ML) 5 ML SYRINGE
INTRAMUSCULAR | Status: DC | PRN
  Administered 2019-11-28: 80 mg via INTRAVENOUS
  Administered 2019-11-28: 5.25 mL/h via INTRAVENOUS

## 2019-11-28 MED ORDER — OXYCODONE HCL 5 MG PO TABS: 5.0000 mg | ORAL_TABLET | Freq: Once | ORAL | Status: DC | PRN

## 2019-11-28 MED ORDER — MORPHINE SULFATE (PF) 2 MG/ML IV SOLN
1.0000 mg | INTRAVENOUS | Status: DC | PRN
  Administered 2019-11-28 – 2019-11-29 (×3): 2 mg via INTRAVENOUS
  Filled 2019-11-28 (×3): qty 1

## 2019-11-28 MED ORDER — METOPROLOL TARTRATE 5 MG/5ML IV SOLN: 5.0000 mg | Freq: Four times a day (QID) | INTRAVENOUS | Status: DC | PRN

## 2019-11-28 MED ORDER — PROPOFOL 10 MG/ML IV BOLUS
INTRAVENOUS | Status: AC
  Filled 2019-11-28: qty 20

## 2019-11-28 MED ORDER — SUCCINYLCHOLINE CHLORIDE 20 MG/ML IJ SOLN
INTRAMUSCULAR | Status: DC | PRN
  Administered 2019-11-28: 140 mg via INTRAVENOUS

## 2019-11-28 MED ORDER — KETOROLAC TROMETHAMINE 30 MG/ML IJ SOLN
INTRAMUSCULAR | Status: AC
  Administered 2019-11-28: 30 mg via INTRAVENOUS
  Filled 2019-11-28: qty 1

## 2019-11-28 MED ORDER — SUGAMMADEX SODIUM 200 MG/2ML IV SOLN
INTRAVENOUS | Status: DC | PRN
  Administered 2019-11-28: 300 mg via INTRAVENOUS

## 2019-11-28 MED ORDER — OXYCODONE HCL 5 MG/5ML PO SOLN
5.0000 mg | Freq: Four times a day (QID) | ORAL | Status: DC | PRN
  Administered 2019-11-28 – 2019-11-29 (×3): 5 mg via ORAL
  Filled 2019-11-28 (×3): qty 5

## 2019-11-28 MED ORDER — SODIUM CHLORIDE (PF) 0.9 % IJ SOLN
INTRAMUSCULAR | Status: DC | PRN
  Administered 2019-11-28: 10 mL

## 2019-11-28 MED ORDER — ROCURONIUM BROMIDE 10 MG/ML (PF) SYRINGE
PREFILLED_SYRINGE | INTRAVENOUS | Status: DC | PRN
  Administered 2019-11-28: 60 mg via INTRAVENOUS
  Administered 2019-11-28 (×2): 20 mg via INTRAVENOUS

## 2019-11-28 MED ORDER — LIDOCAINE HCL 2 % IJ SOLN
INTRAMUSCULAR | Status: AC
  Filled 2019-11-28: qty 20

## 2019-11-28 MED ORDER — ONDANSETRON HCL 4 MG/2ML IJ SOLN: 4.0000 mg | INTRAMUSCULAR | Status: DC | PRN

## 2019-11-28 MED ORDER — FENTANYL CITRATE (PF) 250 MCG/5ML IJ SOLN
INTRAMUSCULAR | Status: AC
  Filled 2019-11-28: qty 5

## 2019-11-28 MED ORDER — ENSURE MAX PROTEIN PO LIQD: 2.0000 [oz_av] | ORAL | Status: DC

## 2019-11-28 MED ORDER — LACTATED RINGERS IV SOLN: INTRAVENOUS | Status: DC

## 2019-11-28 MED ORDER — FENTANYL CITRATE (PF) 100 MCG/2ML IJ SOLN
INTRAMUSCULAR | Status: AC
  Administered 2019-11-28: 50 ug via INTRAVENOUS
  Filled 2019-11-28: qty 4

## 2019-11-28 MED ORDER — KCL IN DEXTROSE-NACL 20-5-0.45 MEQ/L-%-% IV SOLN
INTRAVENOUS | Status: DC
  Filled 2019-11-28 (×3): qty 1000

## 2019-11-28 MED ORDER — EPHEDRINE 5 MG/ML INJ
INTRAVENOUS | Status: AC
  Filled 2019-11-28: qty 10

## 2019-11-28 MED ORDER — 0.9 % SODIUM CHLORIDE (POUR BTL) OPTIME
TOPICAL | Status: DC | PRN
  Administered 2019-11-28: 1000 mL

## 2019-11-28 MED ORDER — HEPARIN SODIUM (PORCINE) 5000 UNIT/ML IJ SOLN: 5000.0000 [IU] | Freq: Three times a day (TID) | INTRAMUSCULAR | Status: DC

## 2019-11-28 MED ORDER — FENTANYL CITRATE (PF) 100 MCG/2ML IJ SOLN
INTRAMUSCULAR | Status: DC | PRN
  Administered 2019-11-28: 100 ug via INTRAVENOUS
  Administered 2019-11-28 (×5): 50 ug via INTRAVENOUS
  Administered 2019-11-28: 100 ug via INTRAVENOUS

## 2019-11-28 MED ORDER — HEPARIN SODIUM (PORCINE) 5000 UNIT/ML IJ SOLN
5000.0000 [IU] | Freq: Three times a day (TID) | INTRAMUSCULAR | Status: DC
  Administered 2019-11-28 – 2019-11-29 (×3): 5000 [IU] via SUBCUTANEOUS
  Filled 2019-11-28 (×3): qty 1

## 2019-11-28 MED ORDER — LACTATED RINGERS IR SOLN
Status: DC | PRN
  Administered 2019-11-28: 1000 mL

## 2019-11-28 MED ORDER — SODIUM CHLORIDE 0.9 % IV SOLN
2.0000 g | INTRAVENOUS | Status: AC
  Administered 2019-11-28: 2 g via INTRAVENOUS
  Filled 2019-11-28: qty 2

## 2019-11-28 MED ORDER — KETAMINE HCL 10 MG/ML IJ SOLN
INTRAMUSCULAR | Status: AC
  Filled 2019-11-28: qty 1

## 2019-11-28 MED ORDER — PROPOFOL 10 MG/ML IV BOLUS
INTRAVENOUS | Status: DC | PRN
  Administered 2019-11-28: 200 mg via INTRAVENOUS

## 2019-11-28 MED ORDER — PANTOPRAZOLE SODIUM 40 MG IV SOLR
40.0000 mg | Freq: Every day | INTRAVENOUS | Status: DC
  Administered 2019-11-28: 40 mg via INTRAVENOUS
  Filled 2019-11-28: qty 40

## 2019-11-28 MED ORDER — APREPITANT 40 MG PO CAPS
40.0000 mg | ORAL_CAPSULE | ORAL | Status: AC
  Administered 2019-11-28: 40 mg via ORAL
  Filled 2019-11-28: qty 1

## 2019-11-28 MED ORDER — ACETAMINOPHEN 500 MG PO TABS: 1000.0000 mg | ORAL_TABLET | Freq: Three times a day (TID) | ORAL | Status: DC

## 2019-11-28 MED ORDER — PROMETHAZINE HCL 25 MG/ML IJ SOLN: 6.2500 mg | INTRAMUSCULAR | Status: DC | PRN

## 2019-11-28 MED ORDER — KETAMINE HCL 10 MG/ML IJ SOLN
INTRAMUSCULAR | Status: DC | PRN
  Administered 2019-11-28: 60 mg via INTRAVENOUS

## 2019-11-28 MED ORDER — PHENYLEPHRINE 40 MCG/ML (10ML) SYRINGE FOR IV PUSH (FOR BLOOD PRESSURE SUPPORT)
PREFILLED_SYRINGE | INTRAVENOUS | Status: DC | PRN
  Administered 2019-11-28 (×3): 80 ug via INTRAVENOUS
  Administered 2019-11-28: 120 ug via INTRAVENOUS

## 2019-11-28 MED ORDER — KETOROLAC TROMETHAMINE 30 MG/ML IJ SOLN: 30.0000 mg | Freq: Once | INTRAMUSCULAR | Status: AC | PRN

## 2019-11-28 MED ORDER — CHLORHEXIDINE GLUCONATE CLOTH 2 % EX PADS: 6.0000 | MEDICATED_PAD | Freq: Once | CUTANEOUS | Status: DC

## 2019-11-28 MED ORDER — SUGAMMADEX SODIUM 500 MG/5ML IV SOLN
INTRAVENOUS | Status: AC
  Filled 2019-11-28: qty 5

## 2019-11-28 MED ORDER — OXYCODONE HCL 5 MG/5ML PO SOLN: 5.0000 mg | Freq: Once | ORAL | Status: DC | PRN

## 2019-11-28 MED ORDER — GABAPENTIN 300 MG PO CAPS
300.0000 mg | ORAL_CAPSULE | ORAL | Status: AC
  Administered 2019-11-28: 300 mg via ORAL
  Filled 2019-11-28: qty 1

## 2019-11-28 MED ORDER — ACETAMINOPHEN 160 MG/5ML PO SOLN
1000.0000 mg | Freq: Three times a day (TID) | ORAL | Status: DC
  Administered 2019-11-28 – 2019-11-29 (×3): 1000 mg via ORAL
  Filled 2019-11-28 (×3): qty 40.6

## 2019-11-28 MED ORDER — ONDANSETRON HCL 4 MG/2ML IJ SOLN
INTRAMUSCULAR | Status: AC
  Filled 2019-11-28: qty 2

## 2019-11-28 MED ORDER — SODIUM CHLORIDE (PF) 0.9 % IJ SOLN
INTRAMUSCULAR | Status: AC
  Filled 2019-11-28: qty 10

## 2019-11-28 MED ORDER — SCOPOLAMINE 1 MG/3DAYS TD PT72
1.0000 | MEDICATED_PATCH | TRANSDERMAL | Status: DC
  Administered 2019-11-28: 1.5 mg via TRANSDERMAL
  Filled 2019-11-28: qty 1

## 2019-11-28 MED ORDER — HEPARIN SODIUM (PORCINE) 5000 UNIT/ML IJ SOLN
5000.0000 [IU] | INTRAMUSCULAR | Status: AC
  Administered 2019-11-28: 5000 [IU] via SUBCUTANEOUS
  Filled 2019-11-28: qty 1

## 2019-11-28 MED ORDER — MIDAZOLAM HCL 5 MG/5ML IJ SOLN
INTRAMUSCULAR | Status: DC | PRN
  Administered 2019-11-28: 2 mg via INTRAVENOUS

## 2019-11-28 MED ORDER — KETOROLAC TROMETHAMINE 15 MG/ML IJ SOLN
INTRAMUSCULAR | Status: AC
  Filled 2019-11-28: qty 1

## 2019-11-28 MED ORDER — BUPIVACAINE LIPOSOME 1.3 % IJ SUSP
INTRAMUSCULAR | Status: DC | PRN
  Administered 2019-11-28: 20 mL

## 2019-11-28 SURGICAL SUPPLY — 61 items
APPLICATOR COTTON TIP 6 STRL (MISCELLANEOUS) IMPLANT
APPLICATOR COTTON TIP 6IN STRL (MISCELLANEOUS)
APPLIER CLIP 5 13 M/L LIGAMAX5 (MISCELLANEOUS)
APPLIER CLIP ROT 10 11.4 M/L (STAPLE)
APPLIER CLIP ROT 13.4 12 LRG (CLIP)
BLADE SURG 15 STRL LF DISP TIS (BLADE) ×1 IMPLANT
BLADE SURG 15 STRL SS (BLADE) ×1
CABLE HIGH FREQUENCY MONO STRZ (ELECTRODE) ×2 IMPLANT
CHLORAPREP W/TINT 26 (MISCELLANEOUS) ×4 IMPLANT
CLIP APPLIE 5 13 M/L LIGAMAX5 (MISCELLANEOUS) IMPLANT
CLIP APPLIE ROT 10 11.4 M/L (STAPLE) IMPLANT
CLIP APPLIE ROT 13.4 12 LRG (CLIP) IMPLANT
COVER WAND RF STERILE (DRAPES) IMPLANT
DERMABOND ADVANCED (GAUZE/BANDAGES/DRESSINGS) ×1
DERMABOND ADVANCED .7 DNX12 (GAUZE/BANDAGES/DRESSINGS) ×1 IMPLANT
DEVICE SUT QUICK LOAD TK 5 (STAPLE) IMPLANT
DEVICE SUT TI-KNOT TK 5X26 (MISCELLANEOUS) IMPLANT
DEVICE SUTURE ENDOST 10MM (ENDOMECHANICALS) IMPLANT
DISSECTOR BLUNT TIP ENDO 5MM (MISCELLANEOUS) IMPLANT
ELECT REM PT RETURN 15FT ADLT (MISCELLANEOUS) ×2 IMPLANT
GAUZE SPONGE 4X4 12PLY STRL (GAUZE/BANDAGES/DRESSINGS) IMPLANT
GLOVE BIOGEL M 8.0 STRL (GLOVE) ×2 IMPLANT
GOWN STRL REUS W/TWL XL LVL3 (GOWN DISPOSABLE) ×8 IMPLANT
GRASPER SUT TROCAR 14GX15 (MISCELLANEOUS) ×2 IMPLANT
HANDLE STAPLE EGIA 4 XL (STAPLE) ×2 IMPLANT
HOVERMATT SINGLE USE (MISCELLANEOUS) ×2 IMPLANT
KIT BASIN OR (CUSTOM PROCEDURE TRAY) ×2 IMPLANT
KIT TURNOVER KIT A (KITS) IMPLANT
MARKER SKIN DUAL TIP RULER LAB (MISCELLANEOUS) ×2 IMPLANT
NEEDLE SPNL 22GX3.5 QUINCKE BK (NEEDLE) ×2 IMPLANT
PACK CARDIOVASCULAR III (CUSTOM PROCEDURE TRAY) ×2 IMPLANT
POUCH SPECIMEN RETRIEVAL 10MM (ENDOMECHANICALS) ×2 IMPLANT
RELOAD TRI 45 ART MED THCK BLK (STAPLE) ×2 IMPLANT
RELOAD TRI 45 ART MED THCK PUR (STAPLE) IMPLANT
RELOAD TRI 60 ART MED THCK BLK (STAPLE) ×2 IMPLANT
RELOAD TRI 60 ART MED THCK PUR (STAPLE) ×6 IMPLANT
SCISSORS LAP 5X45 EPIX DISP (ENDOMECHANICALS) IMPLANT
SET IRRIG TUBING LAPAROSCOPIC (IRRIGATION / IRRIGATOR) ×2 IMPLANT
SET TUBE SMOKE EVAC HIGH FLOW (TUBING) ×2 IMPLANT
SHEARS HARMONIC ACE PLUS 45CM (MISCELLANEOUS) ×2 IMPLANT
SLEEVE ADV FIXATION 5X100MM (TROCAR) ×4 IMPLANT
SLEEVE GASTRECTOMY 36FR VISIGI (MISCELLANEOUS) ×2 IMPLANT
SOL ANTI FOG 6CC (MISCELLANEOUS) ×1 IMPLANT
SOLUTION ANTI FOG 6CC (MISCELLANEOUS) ×1
SPONGE LAP 18X18 RF (DISPOSABLE) ×2 IMPLANT
STAPLER VISISTAT 35W (STAPLE) ×2 IMPLANT
SUT MNCRL AB 4-0 PS2 18 (SUTURE) ×4 IMPLANT
SUT SURGIDAC NAB ES-9 0 48 120 (SUTURE) IMPLANT
SUT VICRYL 0 TIES 12 18 (SUTURE) ×2 IMPLANT
SYR 10ML ECCENTRIC (SYRINGE) ×2 IMPLANT
SYR 20ML LL LF (SYRINGE) ×2 IMPLANT
SYR 50ML LL SCALE MARK (SYRINGE) ×2 IMPLANT
TOWEL OR 17X26 10 PK STRL BLUE (TOWEL DISPOSABLE) ×2 IMPLANT
TOWEL OR NON WOVEN STRL DISP B (DISPOSABLE) ×2 IMPLANT
TRAY FOLEY MTR SLVR 16FR STAT (SET/KITS/TRAYS/PACK) IMPLANT
TROCAR ADV FIXATION 5X100MM (TROCAR) ×2 IMPLANT
TROCAR BLADELESS 15MM (ENDOMECHANICALS) ×2 IMPLANT
TROCAR BLADELESS OPT 5 100 (ENDOMECHANICALS) ×2 IMPLANT
TUBE CALIBRATION LAPBAND (TUBING) IMPLANT
TUBING CONNECTING 10 (TUBING) ×2 IMPLANT
TUBING ENDO SMARTCAP (MISCELLANEOUS) ×2 IMPLANT

## 2019-11-28 NOTE — Anesthesia Postprocedure Evaluation (Signed)
Anesthesia Post Note  Patient: SABRE MORIOKA  Procedure(s) Performed: LAPAROSCOPIC GASTRIC SLEEVE RESECTION, UPPER Endo, Eras Pathway (N/A Abdomen)     Patient location during evaluation: PACU Anesthesia Type: General Level of consciousness: awake and alert Pain management: pain level controlled Vital Signs Assessment: post-procedure vital signs reviewed and stable Respiratory status: spontaneous breathing, nonlabored ventilation, respiratory function stable and patient connected to nasal cannula oxygen Cardiovascular status: blood pressure returned to baseline and stable Postop Assessment: no apparent nausea or vomiting Anesthetic complications: no    Last Vitals:  Vitals:   11/28/19 1321 11/28/19 1422  BP: (!) 144/82 (!) 164/74  Pulse: 83 74  Resp: 16 16  Temp: 37 C 36.9 C  SpO2: (!) 87% (!) 86%    Last Pain:  Vitals:   11/28/19 1422  TempSrc: Oral  PainSc:                  Iya Hamed S

## 2019-11-28 NOTE — Transfer of Care (Signed)
Immediate Anesthesia Transfer of Care Note  Patient: Jonathan Rivas  Procedure(s) Performed: LAPAROSCOPIC GASTRIC SLEEVE RESECTION, UPPER Endo, Eras Pathway (N/A Abdomen)  Patient Location: PACU  Anesthesia Type:General  Level of Consciousness: sedated, patient cooperative and responds to stimulation  Airway & Oxygen Therapy: Patient Spontanous Breathing and Patient connected to face mask oxygen  Post-op Assessment: Report given to RN and Post -op Vital signs reviewed and stable  Post vital signs: Reviewed and stable  Last Vitals:  Vitals Value Taken Time  BP 155/92 11/28/19 0935  Temp    Pulse 102 11/28/19 0938  Resp 21 11/28/19 0938  SpO2 91 % 11/28/19 0938  Vitals shown include unvalidated device data.  Last Pain:  Vitals:   11/28/19 0604  TempSrc: Oral  PainSc:          Complications: No apparent anesthesia complications

## 2019-11-28 NOTE — Interval H&P Note (Signed)
History and Physical Interval Note:  11/28/2019 7:10 AM  Jonathan Rivas  has presented today for surgery, with the diagnosis of Morbid Obesity.  The various methods of treatment have been discussed with the patient and family. After consideration of risks, benefits and other options for treatment, the patient has consented to  Procedure(s): LAPAROSCOPIC GASTRIC SLEEVE RESECTION, UPPER Endo, Eras Pathway (N/A) as a surgical intervention.  The patient's history has been reviewed, patient examined, no change in status, stable for surgery.  I have reviewed the patient's chart and labs.  Questions were answered to the patient's satisfaction.     Pedro Earls

## 2019-11-28 NOTE — Op Note (Signed)
28 Nov 2019  Surgeon: Kaylyn Lim, MD, FACS  Asst:  Alphonsa Overall, MD, FACS  Anes:  General endotracheal  Procedure: Laparoscopic sleeve gastrectomy and upper endoscopy  Diagnosis: Morbid obesity  Complications: none  EBL:   minimal cc  Description of Procedure:  The patient was take to OR 1 and given general anesthesia.  The abdomen was prepped with Technicare and draped sterilely.  A timeout was performed.  Access to the abdomen was achieved with 5 mm Optiview.  Following insufflation, the state of the abdomen was found to be free of adhesions.  The ViSiGi 36Fr tube was inserted to deflate the stomach and was pulled back into the esophagus.    The pylorus was identified and we measured 5 cm back and marked the antrum.  At that point we began dissection to take down the greater curvature of the stomach using the Harmonic scalpel.  This dissection was taken all the way up to the left crus.  Posterior attachments of the stomach were also taken down.    The ViSiGi tube was then passed into the antrum and suction applied so that it was snug along the lessor curvature.  The "crow's foot" or incisura was identified.  The sleeve gastrectomy was begun using the [employer] stapler beginning with a 4.5 cm black load with TRS followed by a 6 cm black load with TRS and then multiple firings of the 6 cm purple with TRS.  When the sleeve was complete the tube was taken off suction and insufflated briefly.  The tube was withdrawn.  Upper endoscopy was then performed by Dr. Lucia Gaskins.     The specimen was extracted through the 15 trocar site.  Local was provided by infiltrating with Exparel as a TAP block at the first of the case  and closed 4-0 Monocryl and Dermabond.    [name], [place], [facility], [place]

## 2019-11-28 NOTE — Discharge Instructions (Signed)
° ° ° °GASTRIC BYPASS/SLEEVE ° Home Care Instructions ° ° These instructions are to help you care for yourself when you go home. ° °Call: If you have any problems. °• Call [phone] and ask for the surgeon on call °• If you need immediate help, come to the ER at [facility].  °• Tell the ER staff that you are a new post-op gastric bypass or gastric sleeve patient °  °Signs and symptoms to report: • Severe vomiting or nausea °o If you cannot keep down clear liquids for longer than 1 day, call your surgeon  °• Abdominal pain that does not get better after taking your pain medication °• Fever over 100.4° F with chills °• Heart beating over 100 beats a minute °• Shortness of breath at rest °• Chest pain °•  Redness, swelling, drainage, or foul odor at incision (surgical) sites °•  If your incisions open or pull apart °• Swelling or pain in calf (lower leg) °• Diarrhea (Loose bowel movements that happen often), frequent watery, uncontrolled bowel movements °• Constipation, (no bowel movements for 3 days) if this happens: Pick one °o Milk of Magnesia, 2 tablespoons by mouth, 3 times a day for 2 days if needed °o Stop taking Milk of Magnesia once you have a bowel movement °o Call your doctor if constipation continues °Or °o Miralax  (instead of Milk of Magnesia) following the label instructions °o Stop taking Miralax once you have a bowel movement °o Call your doctor if constipation continues °• Anything you think is not normal °  °Normal side effects after surgery: • Unable to sleep at night or unable to focus °• Irritability or moody °• Being tearful (crying) or depressed °These are common complaints, possibly related to your anesthesia medications that put you to sleep, stress of surgery, and change in lifestyle.  This usually goes away a few weeks after surgery.  If these feelings continue, call your primary care doctor. °  °Wound Care: You may have surgical glue, steri-strips, or staples over your incisions after  surgery °• Surgical glue:  Looks like a clear film over your incisions and will wear off a little at a time °• Steri-strips: Strips of tape over your incisions. You may notice a yellowish color on the skin under the steri-strips. This is used to make the   steri-strips stick better. Do not pull the steri-strips off - let them fall off °• Staples: Staples may be removed before you leave the hospital °o If you go home with staples, call [facility], [phone] at for an appointment with your surgeon’s nurse to have staples removed 10 days after surgery. °• Showering: You may shower two (2) days after your surgery unless your surgeon tells you differently °o Wash gently around incisions with warm soapy water, rinse well, and gently pat dry  °o No tub baths until staples are removed, steri-strips fall off or glue is gone.  °  °Medications: • Medications should be liquid or crushed if larger than the size of a dime °• Extended release pills (medication that release a little bit at a time through the day) should NOT be crushed or cut. (examples include XL, ER, DR, SR) °• Depending on the size and number of medications you take, you may need to space (take a few throughout the day)/change the time you take your medications so that you do not over-fill your pouch (smaller stomach) °• Make sure you follow-up with your primary care doctor to   make medication changes needed during rapid weight loss and life-style changes °• If you have diabetes, follow up with the doctor that orders your diabetes medication(s) within one week after surgery and check your blood sugar regularly. °• Do not drive while taking prescription pain medication  °• It is ok to take Tylenol by the bottle instructions with your pain medicine or instead of your pain medicine as needed.  DO NOT TAKE NSAIDS (EXAMPLES OF NSAIDS:  IBUPROFREN/ NAPROXEN)  °Diet:                    First 2 Weeks ° You will see the dietician t about two (2) weeks  after your surgery. The dietician will increase the types of foods you can eat if you are handling liquids well: °• If you have severe vomiting or nausea and cannot keep down clear liquids lasting longer than 1 day, call your surgeon @ ([phone]) °Protein Shake °• Drink at least 2 ounces of shake 5-6 times per day °• Each serving of protein shakes (usually 8 - 12 ounces) should have: °o 15 grams of protein  °o And no more than 5 grams of carbohydrate  °• Goal for protein each day: °o Men = 80 grams per day °o Women = 60 grams per day °• Protein powder may be added to fluids such as non-fat milk or Lactaid milk or unsweetened Soy/Almond milk (limit to 35 grams added protein powder per serving) ° °Hydration °• Slowly increase the amount of water and other clear liquids as tolerated (See Acceptable Fluids) °• Slowly increase the amount of protein shake as tolerated  °•  Sip fluids slowly and throughout the day.  Do not use straws. °• May use sugar substitutes in small amounts (no more than 6 - 8 packets per day; i.e. Splenda) ° °Fluid Goal °• The first goal is to drink at least 8 ounces of protein shake/drink per day (or as directed by the nutritionist); some examples of protein shakes are Syntrax Nectar, Adkins Advantage, EAS Edge HP, and Unjury. See handout from pre-op Bariatric Education Class: °o Slowly increase the amount of protein shake you drink as tolerated °o You may find it easier to slowly sip shakes throughout the day °o It is important to get your proteins in first °• Your fluid goal is to drink 64 - 100 ounces of fluid daily °o It may take a few weeks to build up to this °• 32 oz (or more) should be clear liquids  °And  °• 32 oz (or more) should be full liquids (see below for examples) °• Liquids should not contain sugar, caffeine, or carbonation ° °Clear Liquids: °• Water or Sugar-free flavored water (i.e. Fruit H2O, Propel) °• Decaffeinated coffee or tea (sugar-free) °• Crystal Lite, Wyler’s Lite,  Minute Maid Lite °• Sugar-free Jell-O °• Bouillon or broth °• Sugar-free Popsicle:   *Less than 20 calories each; Limit 1 per day ° °Full Liquids: °Protein Shakes/Drinks + 2 choices per day of other full liquids °• Full liquids must be: °o No More Than 15 grams of Carbs per serving  °o No More Than 3 grams of Fat per serving °• Strained low-fat cream soup (except Cream of Potato or Tomato) °• Non-Fat milk °• Fat-free Lactaid Milk °• Unsweetened Soy Or Unsweetened Almond Milk °• Low Sugar yogurt (Dannon Lite & Fit, Greek yogurt; Oikos Triple Zero; Chobani Simply 100; Yoplait 100 calorie Greek - No Fruit on the Bottom) ° °  °Vitamins   and Minerals • Start 1 day after surgery unless otherwise directed by your surgeon °• 2 Chewable Bariatric Specific Multivitamin / Multimineral Supplement with iron (Example: Bariatric Advantage Multi EA) °• Chewable Calcium with Vitamin D-3 °(Example: 3 Chewable Calcium Plus 600 with Vitamin D-3) °o Take 500 mg three (3) times a day for a total of 1500 mg each day °o Do not take all 3 doses of calcium at one time as it may cause constipation, and you can only absorb 500 mg  at a time  °o Do not mix multivitamins containing iron with calcium supplements; take 2 hours apart °• Menstruating women and those with a history of anemia (a blood disease that causes weakness) may need extra iron °o Talk with your doctor to see if you need more iron °• Do not stop taking or change any vitamins or minerals until you talk to your dietitian or surgeon °• Your Dietitian and/or surgeon must approve all vitamin and mineral supplements °  °Activity and Exercise: Limit your physical activity as instructed by your doctor.  It is important to continue walking at home.  During this time, use these guidelines: °• Do not lift anything greater than ten (10) pounds for at least two (2) weeks °• Do not go back to work or drive until your surgeon says you can °• You may have sex when you feel comfortable  °o It is  VERY important for male patients to use a reliable birth control method; fertility often increases after surgery  °o All hormonal birth control will be ineffective for 30 days after surgery due to medications given during surgery a barrier method must be used. °o Do not get pregnant for at least 18 months °• Start exercising as soon as your doctor tells you that you can °o Make sure your doctor approves any physical activity °• Start with a simple walking program °• Walk 5-15 minutes each day, 7 days per week.  °• Slowly increase until you are walking 30-45 minutes per day °Consider joining our BELT program. [phone] or email [email or web address] °  °Special Instructions Things to remember: °• Use your CPAP when sleeping if this applies to you ° °• [facility] has two free Bariatric Surgery Support Groups that meet monthly °o The 3rd Thursday of each month, 6 pm, [facility]  °o The 2nd Friday of each month, 11:45 am in the private dining room in the basement of [facility] °• It is very important to keep all follow up appointments with your surgeon, dietitian, primary care physician, and behavioral health practitioner °• Routine follow up schedule with your surgeon include appointments at 2-3 weeks, 6-8 weeks, 6 months, and 1 year at a minimum.  Your surgeon may request to see you more often.   °o After the first year, please follow up with your bariatric surgeon and dietitian at least once a year in order to maintain best weight loss results °[facility]: [phone] °[facility]: [phone] °Bariatric Nurse Coordinator: [phone] °  °   Reviewed and Endorsed  °by [facility] Patient Education Committee, June, 2016 °Edits Approved: Aug, 2018 ° ° ° °

## 2019-11-28 NOTE — Anesthesia Procedure Notes (Signed)
Procedure Name: Intubation Performed by: Gean Maidens, CRNA Pre-anesthesia Checklist: Patient identified, Emergency Drugs available, Suction available, Patient being monitored and Timeout performed Patient Re-evaluated:Patient Re-evaluated prior to induction Oxygen Delivery Method: Circle system utilized Preoxygenation: Pre-oxygenation with 100% oxygen Induction Type: IV induction Ventilation: Mask ventilation without difficulty Laryngoscope Size: Mac and 4 Grade View: Grade III Tube type: Oral Tube size: 7.5 mm Number of attempts: 1 Airway Equipment and Method: Stylet Placement Confirmation: ETT inserted through vocal cords under direct vision,  positive ETCO2 and breath sounds checked- equal and bilateral Secured at: 23 cm Tube secured with: Tape Dental Injury: Teeth and Oropharynx as per pre-operative assessment

## 2019-11-28 NOTE — Anesthesia Preprocedure Evaluation (Signed)
Anesthesia Evaluation  Patient identified by MRN, date of birth, ID band Patient awake    Reviewed: Allergy & Precautions, NPO status , Patient's Chart, lab work & pertinent test results  Airway Mallampati: II  TM Distance: <3 FB Neck ROM: Full    Dental no notable dental hx.    Pulmonary Current Smoker,    Pulmonary exam normal breath sounds clear to auscultation       Cardiovascular negative cardio ROS Normal cardiovascular exam Rhythm:Regular Rate:Normal     Neuro/Psych negative neurological ROS  negative psych ROS   GI/Hepatic negative GI ROS, Neg liver ROS,   Endo/Other  Morbid obesity  Renal/GU negative Renal ROS  negative genitourinary   Musculoskeletal negative musculoskeletal ROS (+)   Abdominal (+) + obese,   Peds negative pediatric ROS (+)  Hematology negative hematology ROS (+)   Anesthesia Other Findings   Reproductive/Obstetrics negative OB ROS                             Anesthesia Physical Anesthesia Plan  ASA: III  Anesthesia Plan: General   Post-op Pain Management:    Induction: Intravenous  PONV Risk Score and Plan: 1 and Ondansetron, Dexamethasone and Treatment may vary due to age or medical condition  Airway Management Planned: Oral ETT  Additional Equipment:   Intra-op Plan:   Post-operative Plan: Extubation in OR  Informed Consent: I have reviewed the patients History and Physical, chart, labs and discussed the procedure including the risks, benefits and alternatives for the proposed anesthesia with the patient or authorized representative who has indicated his/her understanding and acceptance.     Dental advisory given  Plan Discussed with: CRNA and Surgeon  Anesthesia Plan Comments:         Anesthesia Quick Evaluation

## 2019-11-28 NOTE — Progress Notes (Signed)
PHARMACY CONSULT FOR:  Risk Assessment for Post-Discharge VTE Following Bariatric Surgery  Post-Discharge VTE Risk Assessment: This patient's probability of 30-day post-discharge VTE is increased due to the factors marked:  x Male    Age >/=60 years    BMI >/=50 kg/m2    CHF    Dyspnea at Rest    Paraplegia   x Non-gastric-band surgery    Operation Time >/=3 hr    Return to OR     Length of Stay >/= 3 d      Hx of VTE   Hypercoagulable condition   Significant venous stasis   Predicted probability of 30-day post-discharge VTE: 0.31%  Recommendation for Discharge: No pharmacologic prophylaxis post-discharge  Jonathan Rivas is a 49 y.o. male who underwent laparoscopic sleeve gastrectomy on 11/28/19   Case start: 0750  Case end: 0926  No Known Allergies  Patient Measurements: Height: 5\' 9"  (175.3 cm) Weight: (!) 316 lb (143.3 kg) IBW/kg (Calculated) : 70.7 Body mass index is 46.67 kg/m.  Recent Labs    11/28/19 1021  WBC 14.8*  HGB 14.9  HCT 47.9  PLT 220   Estimated Creatinine Clearance: 159.2 mL/min (by C-G formula based on SCr of 0.75 mg/dL).    Past Medical History:  Diagnosis Date  . At risk for diabetes mellitus 10/28/2018  . Depression   . Elevated blood pressure reading 04/10/2015  . Erectile dysfunction   . Hyperlipidemia 06/22/2019  . Insomnia 12/28/2017  . Numbness and tingling in both hands 09/25/2016  . Obesity, morbid, BMI 40.0-49.9 ([place])   . Palpitations 09/25/2016  . Tobacco use     Lenis Noon, PharmD 11/28/2019,12:46 PM

## 2019-11-28 NOTE — Op Note (Signed)
Name:  VADIM EASTIN MRN: [id number] Date of Surgery: 11/28/2019  Preop Diagnosis:  Morbid Obesity  Postop Diagnosis:  Morbid Obesity (Weight - 143 kg, BMI - 46.6), S/P Gastric Sleeve resection  Procedure:  Upper endoscopy  (Intraoperative)  Surgeon:  Alphonsa Overall, M.D.  Anesthesia:  GET  Indications for procedure: Jonathan Rivas is a 49 y.o. male whose primary care physician is McDiarmid, Blane Ohara, MD and has completed a gastric sleeve resection today for weight loss by Dr. Hassell Done.  I am doing an intraoperative upper endoscopy to evaluate the gastric pouch after the sleeve gastrectomy.  Operative Note: The patient is under general anesthesia.  Dr. Hassell Done is laparoscoping the patient while I do an upper endoscopy to evaluate the stomach pouch.  With the patient intubated, I passed the Olympus upper endoscope without difficulty down the esophagus.  The esophagus was unremarkable.  The esophago-gastric junction was at 38 cm.    The mucosa of the stomach looked viable and the staple line was intact without bleeding.  I advanced the scope to the pylorus, but did not go through it.  While I insufflated the stomach pouch with air, Dr. Hassell Done  flooded the upper abdomen with saline to put the gastric pouch under saline.  There was no bubbling or evidence of a leak.  There was no evidence of narrowing of the pouch and the gastric sleeve looked tubular.  The scope was then withdrawn.  The esophagus was unremarkable and the patient tolerated the endoscopy without difficulty.  Alphonsa Overall, MD, [facility] Surgery Office phone:  [phone]

## 2019-11-29 ENCOUNTER — Emergency Department (HOSPITAL_COMMUNITY)
Admission: EM | Admit: 2019-11-29 | Discharge: 2019-11-29 | Disposition: A | Discharge status: Home / Self Care | Payer: No Typology Code available for payment source | Attending: Emergency Medicine | Admitting: Emergency Medicine

## 2019-11-29 ENCOUNTER — Other Ambulatory Visit: Payer: Self-pay

## 2019-11-29 DIAGNOSIS — Z9889 Other specified postprocedural states: Secondary | ICD-10-CM | POA: Insufficient documentation

## 2019-11-29 DIAGNOSIS — M79604 Pain in right leg: Secondary | ICD-10-CM | POA: Diagnosis not present

## 2019-11-29 DIAGNOSIS — Z5321 Procedure and treatment not carried out due to patient leaving prior to being seen by health care provider: Secondary | ICD-10-CM | POA: Insufficient documentation

## 2019-11-29 DIAGNOSIS — M79605 Pain in left leg: Secondary | ICD-10-CM | POA: Insufficient documentation

## 2019-11-29 LAB — CBC WITH DIFFERENTIAL/PLATELET
Abs Immature Granulocytes: 0.08 10*3/uL — ABNORMAL HIGH (ref 0.00–0.07)
Basophils Absolute: 0 10*3/uL (ref 0.0–0.1)
Basophils Relative: 0 %
Eosinophils Absolute: 0 10*3/uL (ref 0.0–0.5)
Eosinophils Relative: 0 %
HCT: 44.4 % (ref 39.0–52.0)
Hemoglobin: 14 g/dL (ref 13.0–17.0)
Immature Granulocytes: 1 %
Lymphocytes Relative: 16 %
Lymphs Abs: 2.7 10*3/uL (ref 0.7–4.0)
MCH: 29.6 pg (ref 26.0–34.0)
MCHC: 31.5 g/dL (ref 30.0–36.0)
MCV: 93.9 fL (ref 80.0–100.0)
Monocytes Absolute: 1.5 10*3/uL — ABNORMAL HIGH (ref 0.1–1.0)
Monocytes Relative: 9 %
Neutro Abs: 12.8 10*3/uL — ABNORMAL HIGH (ref 1.7–7.7)
Neutrophils Relative %: 74 %
Platelets: 255 10*3/uL (ref 150–400)
RBC: 4.73 MIL/uL (ref 4.22–5.81)
RDW: 14.4 % (ref 11.5–15.5)
WBC: 17.1 10*3/uL — ABNORMAL HIGH (ref 4.0–10.5)
nRBC: 0 % (ref 0.0–0.2)

## 2019-11-29 LAB — SURGICAL PATHOLOGY

## 2019-11-29 MED ORDER — ONDANSETRON 4 MG PO TBDP: 4.0000 mg | ORAL_TABLET | Freq: Four times a day (QID) | ORAL | 0 refills | Status: DC | PRN

## 2019-11-29 MED ORDER — OXYCODONE HCL 5 MG PO TABS: 5.0000 mg | ORAL_TABLET | Freq: Four times a day (QID) | ORAL | 0 refills | Status: DC | PRN

## 2019-11-29 MED ORDER — OXYCODONE HCL 5 MG/5ML PO SOLN
5.0000 mg | ORAL | Status: DC | PRN
  Administered 2019-11-29 (×2): 10 mg via ORAL
  Filled 2019-11-29 (×2): qty 10

## 2019-11-29 MED ORDER — PANTOPRAZOLE SODIUM 40 MG PO TBEC: 40.0000 mg | DELAYED_RELEASE_TABLET | Freq: Every day | ORAL | 0 refills | Status: DC

## 2019-11-29 MED FILL — PANTOPRAZOLE SOD DR 40 MG T: 40 | 90 days supply | Qty: 90 | Fill #0

## 2019-11-29 MED FILL — ONDANSETRON ODT 4 MG TABLET: 4 | 5 days supply | Qty: 20 | Fill #0

## 2019-11-29 MED FILL — oxyCODONE HCL 5 MG TABS: 5 | 3 days supply | Qty: 10 | Fill #0

## 2019-11-29 NOTE — Progress Notes (Signed)
Patient reevaluated after medication changes.  States better pain control and denies nausea.  Since 823 am patient consumed 4 ounces of protein and 4 ounces of water.  Ambulating in the hallway, IS demonstrated to RN.

## 2019-11-29 NOTE — Discharge Summary (Signed)
Physician Discharge Summary  Patient ID: CHANCELER KOLB MRN: [id number] DOB/AGE: 12-13-1970 49 y.o.  PCP: McDiarmid, Blane Ohara, MD  Admit date: 11/28/2019 Discharge date: 11/29/2019  Admission Diagnoses:  Morbid obesity  Discharge Diagnoses:  same  Principal Problem:   S/P laparoscopic sleeve gastrectomy   Surgery:  Laparoscopic sleeve gastrectomy  Discharged Condition: improved  Hospital Course:   Had surgery on Monday, Jan 4.  Was started on clears and advanced to protein shakes.  Ready for discharge on the afternoon of Tuesday the 5th.    Consults: none  Significant Diagnostic Studies: none    Discharge Exam: Blood pressure (!) 155/76, pulse (!) 55, temperature 98.3 F (36.8 C), temperature source Oral, resp. rate 18, height 5\' 9"  (1.753 m), weight (!) 143.3 kg, SpO2 93 %. Incisions OK  Disposition: Discharge disposition: 01-Home or Self Care       Discharge Instructions    Ambulate hourly while awake   Complete by: As directed    Call MD for:  difficulty breathing, headache or visual disturbances   Complete by: As directed    Call MD for:  persistant dizziness or light-headedness   Complete by: As directed    Call MD for:  persistant nausea and vomiting   Complete by: As directed    Call MD for:  redness, tenderness, or signs of infection (pain, swelling, redness, odor or green/yellow discharge around incision site)   Complete by: As directed    Call MD for:  severe uncontrolled pain   Complete by: As directed    Call MD for:  temperature >101 F   Complete by: As directed    Diet bariatric full liquid   Complete by: As directed    Incentive spirometry   Complete by: As directed    Perform hourly while awake     Allergies as of 11/29/2019   No Known Allergies     Medication List    TAKE these medications   alfuzosin 10 MG 24 hr tablet Commonly known as: UROXATRAL TAKE 1 TABLET (10 MG TOTAL) BY MOUTH DAILY WITH BREAKFAST.   aspirin 81 MG  tablet Take 81 mg by mouth daily.   ibuprofen 200 MG tablet Commonly known as: ADVIL Take 200-600 mg by mouth every 6 (six) hours as needed for headache or moderate pain. Notes to patient: Avoid NSAIDs for 6-8 weeks after surgery   loratadine 10 MG tablet Commonly known as: CLARITIN Take 10 mg by mouth at bedtime.   multivitamin capsule Take 1 capsule by mouth daily.   ondansetron 4 MG disintegrating tablet Commonly known as: ZOFRAN-ODT Take 1 tablet (4 mg total) by mouth every 6 (six) hours as needed for nausea or vomiting.   oxyCODONE 5 MG immediate release tablet Commonly known as: Oxy IR/ROXICODONE Take 1 tablet (5 mg total) by mouth every 6 (six) hours as needed for severe pain.   pantoprazole 40 MG tablet Commonly known as: PROTONIX Take 1 tablet (40 mg total) by mouth daily.   rOPINIRole 2 MG tablet Commonly known as: REQUIP TAKE 1 TABLET BY MOUTH AT BEDTIME.   traZODone 50 MG tablet Commonly known as: DESYREL TAKE 1 - 2 TABLETS BY MOUTH AT BEDTIME AS NEEDED. What changed: See the new instructions.   venlafaxine XR 150 MG 24 hr capsule Commonly known as: EFFEXOR-XR TAKE 1 CAPSULE BY MOUTH ONCE A DAY BEFORE BREAKFAST What changed: See the new instructions.      Follow-up Information    Johnathan Hausen, MD.  Go on 12/30/2019.   Specialty: General Surgery Why: at 230 pm Contact information: [address] [place] [address] [phone]        Carlena Hurl, PA-C. Go on 12/08/2019.   Specialty: General Surgery Why: at [address] information: [place] [address] [phone]           Signed: Pedro Earls 11/29/2019, 12:21 PM

## 2019-11-29 NOTE — ED Notes (Signed)
After triage, explained to patient that the wait would be a little bit and that he would ultimately have an ultrasound at [facility] to rule out DVT in the AM. He stated he wanted to leave and thought the pain was from not related to a blood clot.

## 2019-11-29 NOTE — Progress Notes (Signed)
Patient alert and oriented, pain is controlled. Patient is tolerating fluids, advanced to protein shake today, patient is tolerating well.  Reviewed Gastric sleeve discharge instructions with patient and patient is able to articulate understanding.  Provided information on BELT program, Support Group and [facility] outpatient pharmacy. All questions answered, will continue to monitor.   Total 24 hour fluid recall 840 Per dehydration protocol callback one week post op

## 2019-11-29 NOTE — Progress Notes (Signed)
Patient alert and oriented, Post op day 1.  Provided support and encouragement.  Encouraged pulmonary toilet, ambulation and small sips of liquids.  Completed 12 ounces bari clear fluid and 6 ounces protein shake.  Pain not controlled, medication changes made by surgeon.  All questions answered.  Will continue to monitor.

## 2019-11-29 NOTE — ED Triage Notes (Signed)
Patient states he had bearetic surgery yesterday. Today, he started experiencing bilateral calf pain, greater on the right calf. Denies any swelling, chest pain, or shortness of breath. Patient is ambulatory and appears in acute distress.

## 2019-11-29 NOTE — Progress Notes (Signed)
Pt alert and oriented, tolerating liquids. D/C instructions given by Parks Neptune, RN. Pt d/cd to home.

## 2019-11-29 NOTE — Progress Notes (Signed)
Nutrition Note  RD consulted for diet education for patient s/p bariatric surgery. Bariatric nurse coordinator providing education at this time.  If nutrition issues arise, please consult RD.   Meera Vasco, MS, RD, LDN Inpatient Clinical Dietitian Pager: [phone] After Hours Pager: [phone]  

## 2019-11-30 ENCOUNTER — Telehealth (HOSPITAL_COMMUNITY): Payer: Self-pay

## 2019-11-30 NOTE — Telephone Encounter (Signed)
Called to discuss ED visit 09/28/2020.  Stated bilateral calf soreness still present, but not as uncomfortable as yesterday.  Patient attributes this to his ambulation stating maybe he overdid it.  He denies redness, swelling in lower extremity, or shortness of breath.  He does not want to be evaluated further at this time.  Discussed that should symptoms persist or progress he should seek evaluation immediately.  MD made aware via Epic message of the above conversation.  Contact information provided to patient for surgeon office and [place].

## 2019-12-01 ENCOUNTER — Other Ambulatory Visit: Payer: Self-pay | Admitting: *Deleted

## 2019-12-01 NOTE — Patient Outreach (Signed)
[facility]) Care Management  12/01/2019  Jonathan Rivas October 19, 1971 [id number]   Transition of care telephone call  Referral received:11/29/19 Initial outreach:12/01/19 Insurance: [place]  Initial unsuccessful telephone call to patient's preferred number in order to complete transition of care assessment; no answer, left HIPAA compliant voicemail message requesting return call.   Objective:Jonathan Rivas was hospitalized at [facility]  From 1/3-11/29/19  For Laparoscopic gastric sleeve bypass Comorbidities include: Morbid Obesity, Depression, hypercholesterolemia, restless leg syndrome, Prediabetes ( A1c 5.9 % on 11/22/19).  He was discharged to home on 11/26/19 without the need for home health services or DME.     Plan: This RNCM will route unsuccessful outreach letter with [place] Management pamphlet and 24 hour Nurse Advice Line Magn[place] to [facility] [facility] clinical pool to be mailed to patient's home address. This RNCM will attempt another outreach within 4 business days.  Joylene Draft, RN, [place] Management Coordinator  [phone]- Mobile [phone]- Toll Free Main Office

## 2019-12-05 ENCOUNTER — Other Ambulatory Visit: Payer: Self-pay | Admitting: *Deleted

## 2019-12-05 ENCOUNTER — Telehealth (HOSPITAL_COMMUNITY): Payer: Self-pay

## 2019-12-05 NOTE — Patient Outreach (Signed)
[facility]) Care Management  12/05/2019  DEAGO ESCOBEDO 09-Aug-1971 [id number]   Transition of care /Case Closure   Referral received:11/29/19 Initial outreach:12/01/19 Insurance: [employer]   Objective:Mr.Jonathan Rivas was hospitalized at [facility]  From 1/3-11/29/19  For Laparoscopic gastric sleeve bypass Comorbidities include: Morbid Obesity, Depression, hypercholesterolemia, restless leg syndrome, Prediabetes ( A1c 5.9 % on 11/22/19).  He was discharged to home on 1/2/21without the need for home health servicesor DME.  Assessment  Patient receiving EMMI interactive  voice responsive calls after discharged from the [place] [facility] close to [facility] care management , and will follow up with patient if indicated by EMMI responses if reports triggers a red alert flag.   Joylene Draft, RN, [place] Management Coordinator  [phone]- Mobile [phone]- Toll Free Main Office

## 2019-12-05 NOTE — Telephone Encounter (Signed)
Patient called to discuss post bariatric surgery follow up questions.  See below:   1.  Tell me about your pain and pain management?sore using tylenol  2.  Let's talk about fluid intake.  How much total fluid are you taking in? 60 ounces + per day  3.  How much protein have you taken in the last 2 days? 50 grams of protein  4.  Have you had nausea?  Tell me about when have experienced nausea and what you did to help?denies  5.  Has the frequency or color changed with your urine? urine output yellow but not dark  6.  Tell me what your incisions look like?no problems with incision  7.  Have you been passing gas? BM?passing gas, had bm x 3 since discharge, denies constipation  8.  If a problem or question were to arise who would you call?  Do you know contact numbers for [place], [facility], and [facility]?aware of how to contact all services  9.  How has the walking going?walking regularly  10.  How are your vitamins and calcium going?  How are you taking them?starting mvi and calcium, no problems

## 2019-12-06 ENCOUNTER — Ambulatory Visit: Payer: Self-pay | Admitting: *Deleted

## 2019-12-13 ENCOUNTER — Other Ambulatory Visit: Payer: Self-pay

## 2019-12-13 ENCOUNTER — Encounter: Payer: No Typology Code available for payment source | Attending: Surgery | Admitting: Skilled Nursing Facility1

## 2019-12-13 DIAGNOSIS — E669 Obesity, unspecified: Secondary | ICD-10-CM | POA: Insufficient documentation

## 2019-12-14 NOTE — Progress Notes (Signed)
2 Week Post-Operative Nutrition Class   Patient was seen on 01/18/19 for Post-Operative Nutrition education at [facility].    Surgery date: 11/28/2019 Surgery type: sleeve Start weight at [facility]: 325.5 Weight today: 293.4   Body Composition Scale declined  Total Body Fat %   Visceral Fat   Fat-Free Mass %    Total Body Water %    Muscle-Mass lbs   Body Fat Displacement          Torso  lbs          Left Leg  lbs          Right Leg  lbs          Left Arm  lbs          Right Arm   lbs      The following the learning objectives were met by the patient during this course:  Identifies Phase 3 (Soft, High Proteins) Dietary Goals and will begin from 2 weeks post-operatively to 2 months post-operatively  Identifies appropriate sources of fluids and proteins   States protein recommendations and appropriate sources post-operatively  Identifies the need for appropriate texture modifications, mastication, and bite sizes when consuming solids  Identifies appropriate multivitamin and calcium sources post-operatively  Describes the need for physical activity post-operatively and will follow MD recommendations  States when to call healthcare provider regarding medication questions or post-operative complications   Handouts given during class include:  Phase 3A: Soft, High Protein Diet Handout   Follow-Up Plan: Patient will follow-up at [facility] in 6 weeks for 2 month post-op nutrition visit for diet advancement per MD.

## 2019-12-19 ENCOUNTER — Telehealth: Payer: Self-pay | Admitting: Skilled Nursing Facility1

## 2019-12-19 NOTE — Telephone Encounter (Signed)
RD called pt to verify fluid intake once starting soft, solid proteins 2 week post-bariatric surgery.   Daily Fluid intake: 64+ Daily Protein intake: 80  Concerns/issues:   No concerns reported

## 2020-01-24 ENCOUNTER — Other Ambulatory Visit: Payer: Self-pay

## 2020-01-24 ENCOUNTER — Encounter: Payer: Self-pay | Admitting: Dietician

## 2020-01-24 ENCOUNTER — Encounter: Payer: No Typology Code available for payment source | Attending: Surgery | Admitting: Dietician

## 2020-01-24 DIAGNOSIS — E669 Obesity, unspecified: Secondary | ICD-10-CM | POA: Insufficient documentation

## 2020-01-24 NOTE — Progress Notes (Signed)
Bariatric Nutrition Follow-Up Visit Medical Nutrition Therapy   2 Months Post-Operative Sleeve Surgery Surgery Date: 10/24/2019  Pt's Expectations of Surgery/ Goals: to get healthy and be able to move more without feeling winded  Pt Reported Successes: more energy, able to be very active every day, tolerating foods/fluids well, feels better overall, BMI under 40   NUTRITION ASSESSMENT  Anthropometrics  Start weight at [facility]: 325.5 lbs (date: 09/01/2019) Today's weight: 264.1 lbs Weight change: -29.3 lbs (since previous nutrition visit)  Body Composition Scale 11/28/2019 01/24/2020  Weight  lbs 293.4 264.1  BMI 46.6 38.7  Total Body Fat  % - 33.6     Visceral Fat - 25  Fat-Free Mass  % - 66.3     Total Body Water  % - 47.3     Muscle-Mass  lbs - 49.7  Body Fat Displacement --- ---         Torso  lbs - 55         Left Leg  lbs - 11         Right Leg  lbs - 11         Left Arm  lbs - 5.5         Right Arm  lbs - 5.5    Lifestyle & Dietary Hx Typical meal pattern is 3 meals per day. May have yogurt and a cheese stick, boiled shrimp, salmon, mahi mahi, pork chops, or chicken. Drinks lots of fluids, well over 64 ounces per day. Having trouble getting to 80 grams of protein per day.    24-Hr Dietary Recall First Meal: protein shake  Snack: -  Second Meal: 2 scrambled eggs Snack: -  Third Meal: 3.5 oz sirloin steak  Snack: - Beverages: water, coffee w/ half & half + Splenda, Body Armor lite   Estimated daily fluid intake: 85 oz Estimated daily protein intake: ~70-75 g Supplements: bariatric MVI, calcium 3x/day  Current average weekly physical activity: 30 minutes/day 7 days/week    Post-Op Goals/ Signs/ Symptoms Using straws: no Drinking while eating: no Chewing/swallowing difficulties: no Changes in vision: no Changes to mood/headaches: no Hair loss/changes to skin/nails: no Difficulty focusing/concentrating: no Sweating: no Dizziness/lightheadedness: no Palpitations: no   Carbonated/caffeinated beverages: coffee N/V/D/C/Gas: constipation  Abdominal pain: no Dumping syndrome: no   NUTRITION DIAGNOSIS  Overweight/obesity (-3.3) related to past poor dietary habits and physical inactivity as evidenced by completed bariatric surgery and following dietary guidelines for continued weight loss and healthy nutrition status.   NUTRITION INTERVENTION Nutrition counseling (C-1) and education (E-2) to facilitate bariatric surgery goals, including: . Diet advancement to the next phase (phase 4) now including non-starchy vegetables  . The importance of consuming adequate calories as well as certain nutrients daily due to the body's need for essential vitamins, minerals, and fats . The importance of daily physical activity and to reach a goal of at least 150 minutes of moderate to vigorous physical activity weekly (or as directed by their physician) due to benefits such as increased musculature and improved lab values  Handouts Provided Include   Phase 4: Protein + Non-Starchy Vegetables   Learning Style & Readiness for Change Teaching method utilized: Visual & Auditory  Demonstrated degree of understanding via: Teach Back  Barriers to learning/adherence to lifestyle change: None Identified    MONITORING & EVALUATION Dietary intake, weekly physical activity, body weight, and goals in 4 months.  Next Steps Patient is to follow-up in 4 months for 6 month post-op follow-up.

## 2020-01-24 NOTE — Patient Instructions (Signed)

## 2020-01-31 MED FILL — rOPINIRole HCL 2 MG TABS: 2 | 90 days supply | Qty: 90 | Fill #1

## 2020-02-23 ENCOUNTER — Other Ambulatory Visit: Payer: Self-pay

## 2020-02-23 ENCOUNTER — Ambulatory Visit (INDEPENDENT_AMBULATORY_CARE_PROVIDER_SITE_OTHER): Payer: No Typology Code available for payment source | Admitting: Family Medicine

## 2020-02-23 ENCOUNTER — Other Ambulatory Visit: Payer: Self-pay | Admitting: Family Medicine

## 2020-02-23 ENCOUNTER — Encounter: Payer: Self-pay | Admitting: Family Medicine

## 2020-02-23 VITALS — BP 112/66 | HR 78 | Wt 249.0 lb

## 2020-02-23 DIAGNOSIS — G4700 Insomnia, unspecified: Secondary | ICD-10-CM | POA: Diagnosis not present

## 2020-02-23 DIAGNOSIS — R7303 Prediabetes: Secondary | ICD-10-CM | POA: Diagnosis not present

## 2020-02-23 DIAGNOSIS — E7800 Pure hypercholesterolemia, unspecified: Secondary | ICD-10-CM

## 2020-02-23 DIAGNOSIS — N3943 Post-void dribbling: Secondary | ICD-10-CM

## 2020-02-23 DIAGNOSIS — F325 Major depressive disorder, single episode, in full remission: Secondary | ICD-10-CM

## 2020-02-23 DIAGNOSIS — G2581 Restless legs syndrome: Secondary | ICD-10-CM

## 2020-02-23 LAB — POCT GLYCOSYLATED HEMOGLOBIN (HGB A1C): Hemoglobin A1C: 5.4 % (ref 4.0–5.6)

## 2020-02-23 MED ORDER — VENLAFAXINE HCL ER 75 MG PO CP24: 75.0000 mg | ORAL_CAPSULE | Freq: Every day | ORAL | 3 refills | Status: DC

## 2020-02-23 MED FILL — VENLAFAXINE HCL ER 75 MG CA: 75 | 90 days supply | Qty: 90 | Fill #0

## 2020-02-23 NOTE — Patient Instructions (Signed)
Your weight reduction is amazing.  Congratulations.  Stop the Effxor XR 150 mg tablet daily. Start the Effexor XR 75 mg daily in morning. If you notice a recurrence of depressive symptoms with this change, return to your 150 mg docse and let Dr Babbette Dalesandro office know. MyChart is a good way to communicate with [place].  Stop the Aspirin.  It has recently been show to not reduce heart attacks in people who have not had heart attacks already..  It also increases the risk of gastric bleeding.   We are checking your A1c today.  Redults will be posted on your MyChart.  If the results are abnormal, Dr Meylin Stenzel will call you.

## 2020-02-24 ENCOUNTER — Encounter: Payer: Self-pay | Admitting: Family Medicine

## 2020-02-24 NOTE — Assessment & Plan Note (Signed)
Established problem Controlled Jonathan Rivas has been on antidepressant therapy for around 10 yrs After discussion, will reduce daily Effexor XR dose to 75 mg each morning with goal of decreasing his early morning awakenings and possible titration off medication.   Should Jonathan Rivas experience worsening of depression symptoms with dose reduction, he should return to his previous 150 mg daily dose and notify our office.   RTC 3 months to consider further reduction

## 2020-02-24 NOTE — Assessment & Plan Note (Signed)
Established problem that has improved.  Improvement started prior to partial gastrectomy with lifestyle changes He is adherent to nutritional and exercise recommendations.   I anticipate further weight reduction. Mr Jonathan Rivas is taking a bariatric MVI and  Bariatric calcium supplement.  He is taking protonix - I am uncertain if this is a standard of care post gastrectomy.   Will not address at this time.

## 2020-02-24 NOTE — Assessment & Plan Note (Signed)
LOw ASCVD 10 yr risk ~3%.   No need to repeat lipids at this time

## 2020-02-24 NOTE — Assessment & Plan Note (Signed)
Established problem Resolved after gastrectomy Not taking alpha-blocker.

## 2020-02-24 NOTE — Assessment & Plan Note (Signed)
Lab Results  Component Value Date   HGBA1C 5.4 02/23/2020  No longer in Prediabetes rangle.  Anticipate that continued lifestyle changes associated with p. Gastrectomy status should prevent return to this at risk state.

## 2020-02-24 NOTE — Progress Notes (Signed)
    SUBJECTIVE:   Jonathan Rivas is alone Sources of clinical information for visit is/are patient and past medical records. Nursing assessment for this office visit was reviewed with the patient for accuracy and revision.   Previous Report(s) Reviewed: historical medical records, office notes and operative reports  Depression screen PHQ 2/9 09/01/2019  Decreased Interest 0  Down, Depressed, Hopeless 0  PHQ - 2 Score 0  Altered sleeping -  Tired, decreased energy -  Change in appetite -  Feeling bad or failure about yourself  -  Trouble concentrating -  Moving slowly or fidgety/restless -  Suicidal thoughts -  PHQ-9 Score -    Fall Risk  09/01/2019 02/08/2016  Falls in the past year? 0 No    Adult vaccines due  Topic Date Due  . TETANUS/TDAP  09/25/2026    There are no preventive care reminders to display for this patient.   History/P.E. limitations: none  Adult vaccines due  Topic Date Due  . TETANUS/TDAP  09/25/2026   There are no preventive care reminders to display for this patient. There are no preventive care reminders to display for this patient.   Chief Complaint  Patient presents with  . Annual Exam   . CHIEF COMPLAINT / HPI:   Major depression - PHQ2 = 0 - No ahedonia nor prolonged sadness - Taking Effexor XR 150 mg daily - concerned it may be contributing to recent insomnia  Morbid obesity - Drastic improvement from combination of pre- and post-operative lifestyle changes and post-sleeve gastrectomy - Adherent to post-gastrectomy dietary and exercise prescriptions  - F/U with Dr Hassell Done ([place]) soon   Insomnia - Recent exacerbation of EMA, mostly without DFA - Able to function during the day, but feels increased fatigue after poor night sleep - No symptoms awakening him from sleep - No known OSA.  No gasping or PND - Taking Trazodone prn, though not very effective in treating the exacerbation  Restless Leg syndrome - Sxs well-controlled -  Taking Requip prn  Post-void dribbling - Resolved. Associated with drastic weight reduction  Prediabetes - Not checking CBGs - Not on medication - (+) weight reduction and increase exercise.     PERTINENT  PMH / PSH: Approx. 3 months post lapascopic gastric sleeve gastrectomy by Dr Hassell Done 11/28/19  OBJECTIVE:   BP 112/66   Pulse 78   Wt 249 lb (112.9 kg)   SpO2 98%   BMI 36.77 kg/m   Gen: obvious reduction in body habitus bulk. Groomed, bright affect Cor: RRR, no mgr, nl PMI Ext: no edema Abd: lap ports healing without evidence of inflammation  ASSESSMENT/PLAN:   No problem-specific Assessment & Plan notes found for this encounter.     Lissa Morales, MD [place]

## 2020-02-24 NOTE — Assessment & Plan Note (Signed)
Established problem worsened.  Decrease Effexor XL to 75 mg each morning then monitor for response

## 2020-04-12 MED FILL — traZODone HCL 50 MG TABS: 50 | 90 days supply | Qty: 180 | Fill #1

## 2020-04-30 ENCOUNTER — Other Ambulatory Visit: Payer: Self-pay | Admitting: *Deleted

## 2020-04-30 ENCOUNTER — Other Ambulatory Visit: Payer: Self-pay | Admitting: Family Medicine

## 2020-04-30 DIAGNOSIS — G2581 Restless legs syndrome: Secondary | ICD-10-CM

## 2020-05-01 ENCOUNTER — Other Ambulatory Visit: Payer: Self-pay | Admitting: Family Medicine

## 2020-05-01 MED ORDER — ROPINIROLE HCL 2 MG PO TABS: 2.0000 mg | ORAL_TABLET | Freq: Every day | ORAL | 3 refills | Status: DC

## 2020-05-14 MED FILL — VENLAFAXINE HCL ER 75 MG CA: 75 | 90 days supply | Qty: 90 | Fill #1

## 2020-06-04 IMAGING — CR CHEST - 2 VIEW
2 series · 2 of 2 positions shown · non-contrast
Comparison: None.

CLINICAL DATA: Preoperative bariatric surgery for morbid obesity

EXAM:
CHEST - 2 VIEW

[w chest pa]
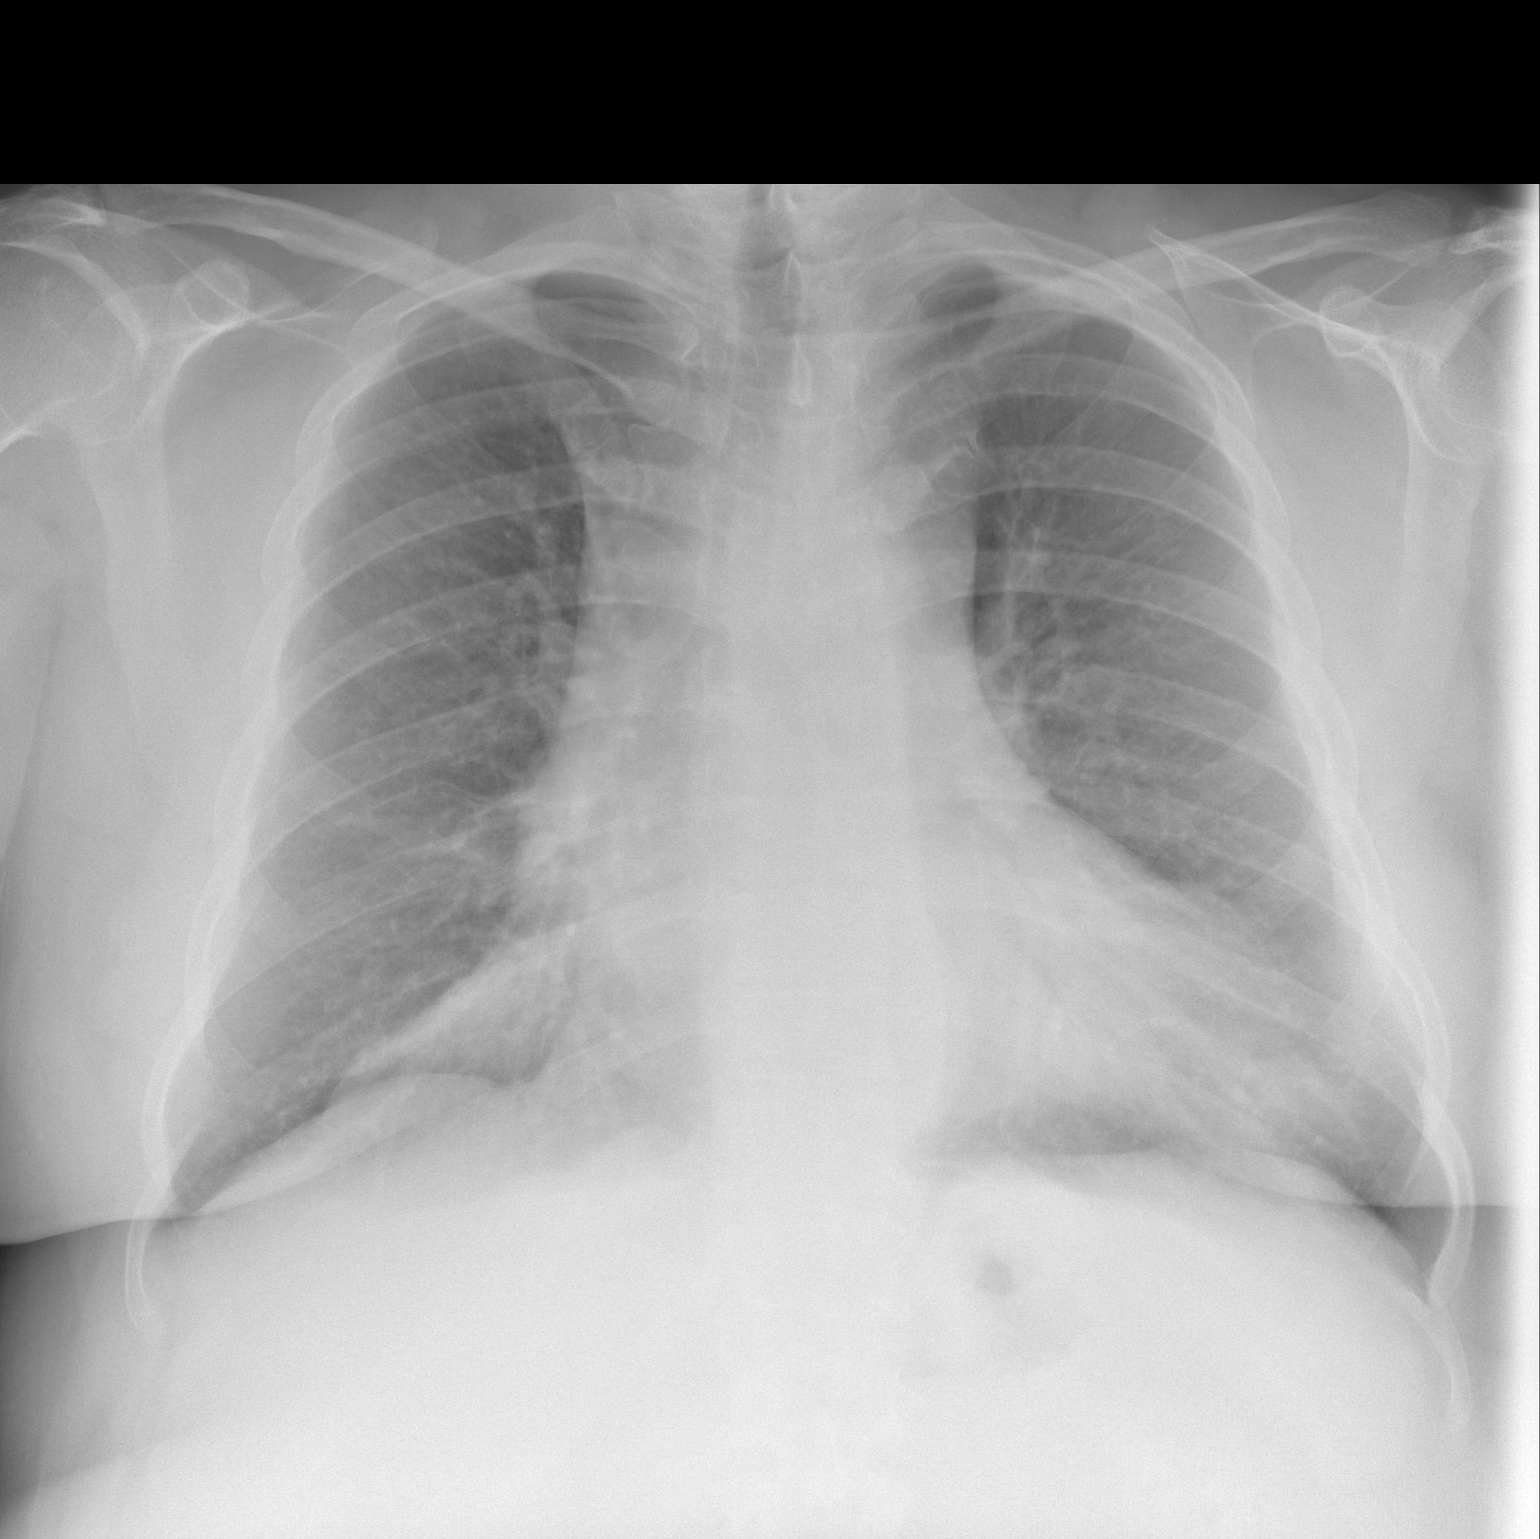

[w chest lat]
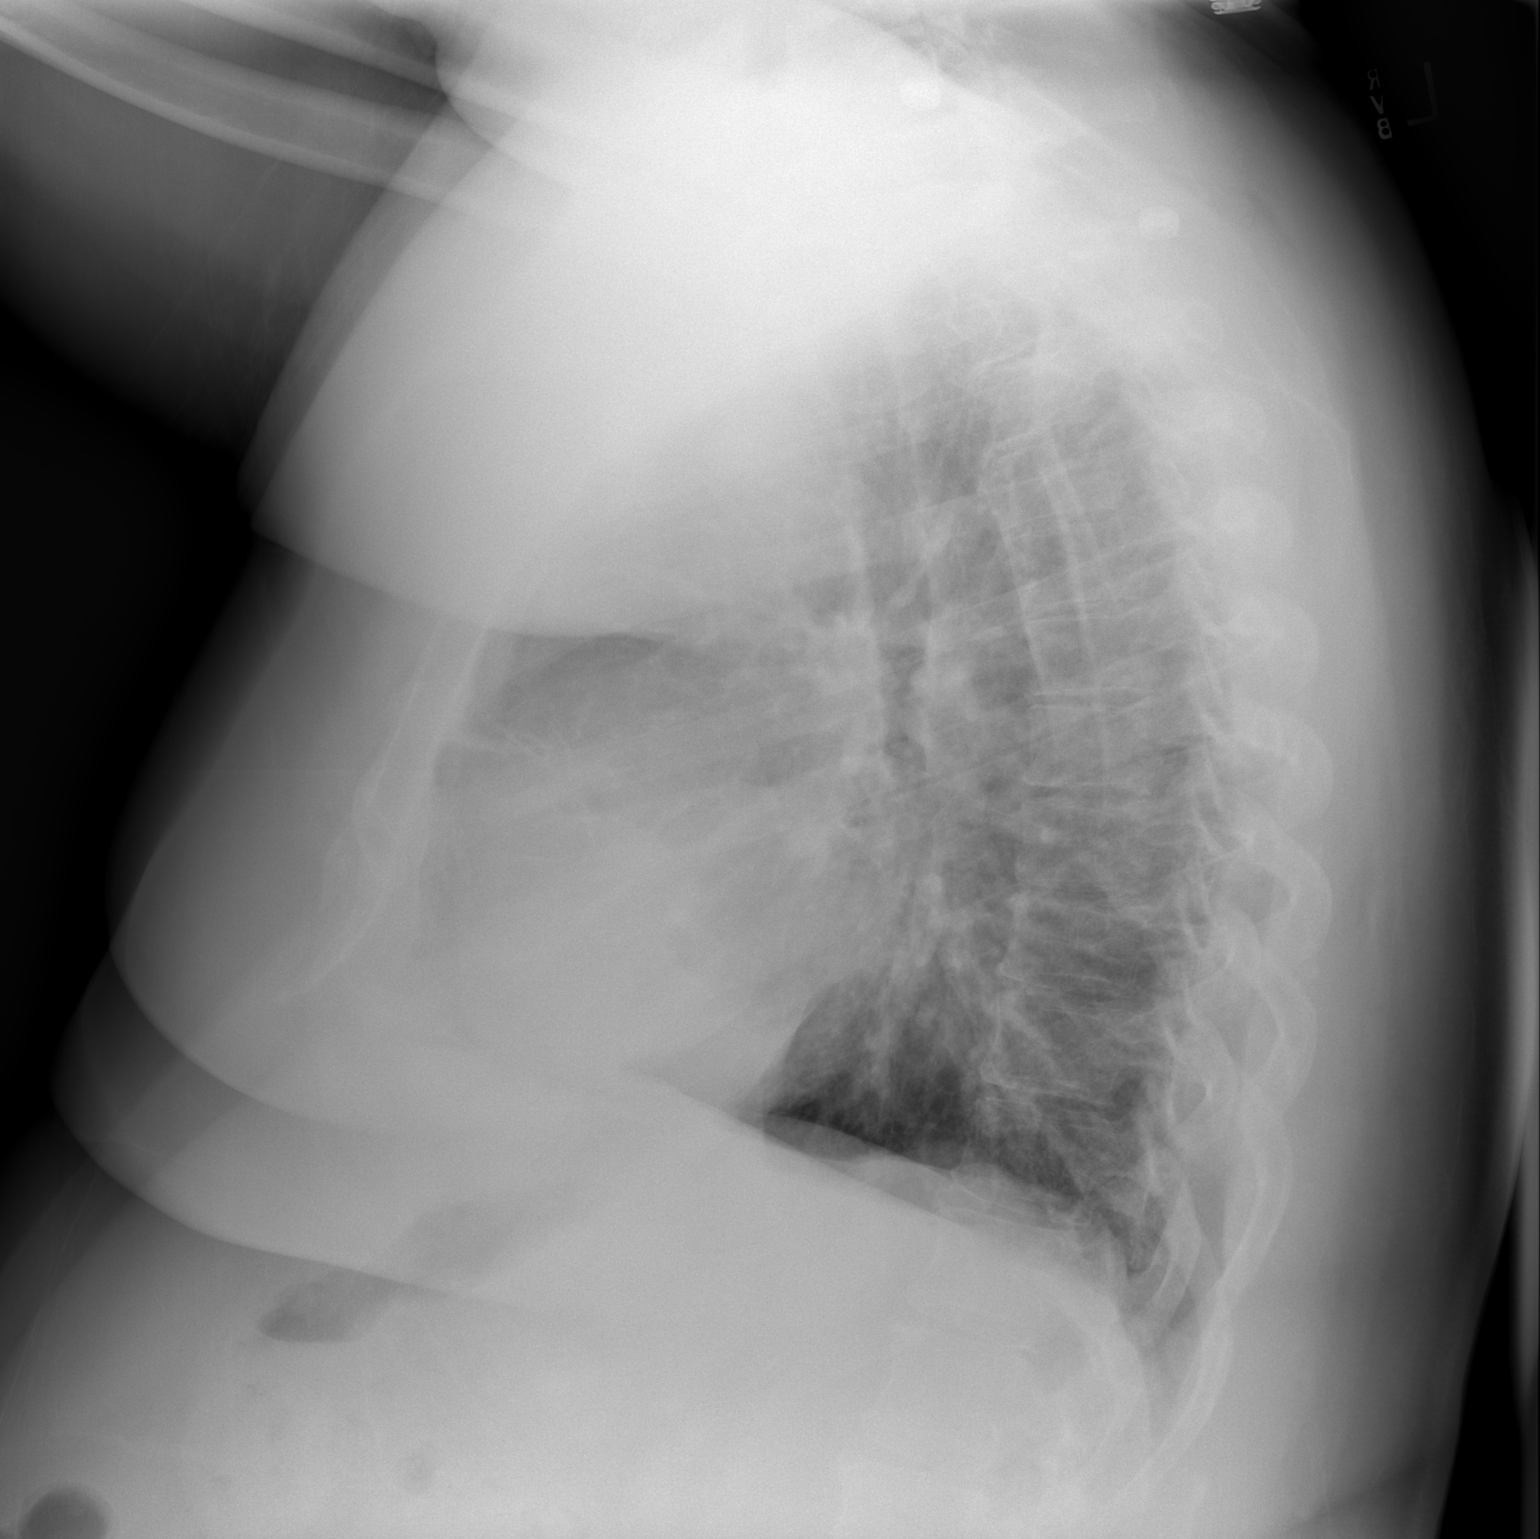

[2 of 2 positions shown; findings below may reference images not displayed]

FINDINGS: There is mild scarring in the right base. There is no edema or
consolidation. Heart is upper normal in size with pulmonary
vascularity normal. No adenopathy. No bone lesions.
IMPRESSION: Scarring right base. No edema or consolidation. Heart upper normal
in size.

## 2020-06-04 IMAGING — RF UPPER GI SERIES (WITHOUT KUB)
7 series · 7 of 7 positions shown · non-contrast
Comparison: None

CLINICAL DATA: Preop bariatric surgery.

EXAM:
UPPER GI SERIES WITH KUB
TECHNIQUE: After obtaining a scout radiograph a routine upper GI series was
performed using thin barium
FLUOROSCOPY TIME:  Fluoroscopy Time:  42 seconds
Radiation Exposure Index (if provided by the fluoroscopic device):
62 mGy
Number of Acquired Spot Images: 3

[Series 1: t abdomen supine · 0.15mm/px · 1 of 1 slices shown]
[im 1/1]
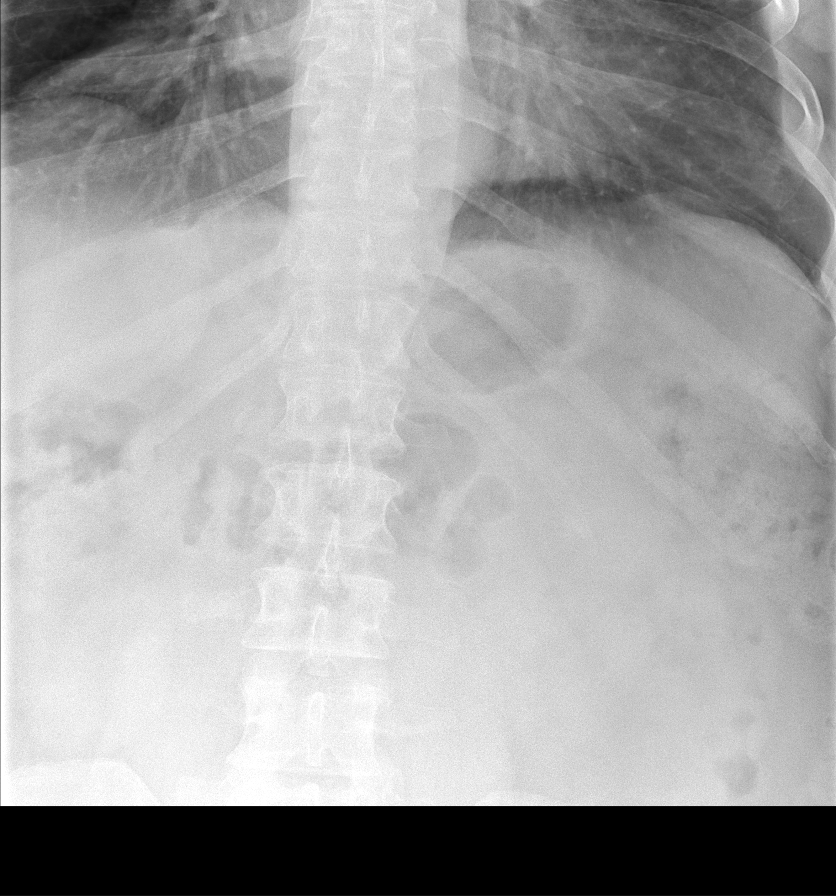

[Series 2: cp_standard · 0.26mm/px · 1 of 1 slices shown (1 of 3)]
[im 1/1]
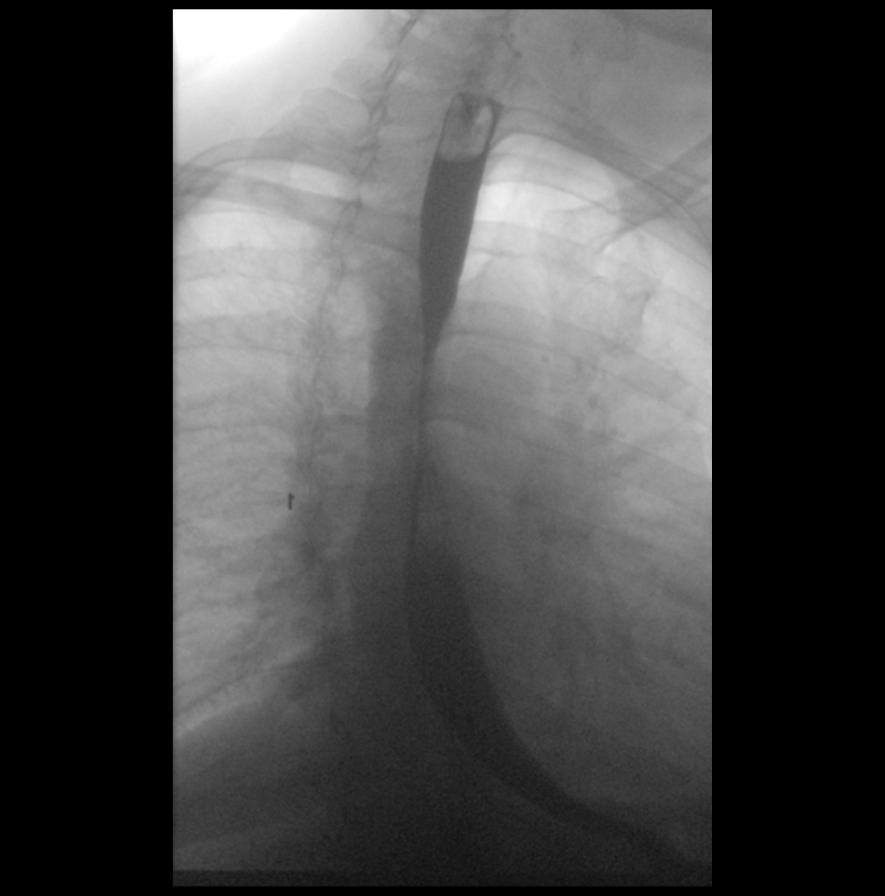

[Series 3: cp_standard · 0.26mm/px · 1 of 1 slices shown (2 of 3)]
[im 1/1]
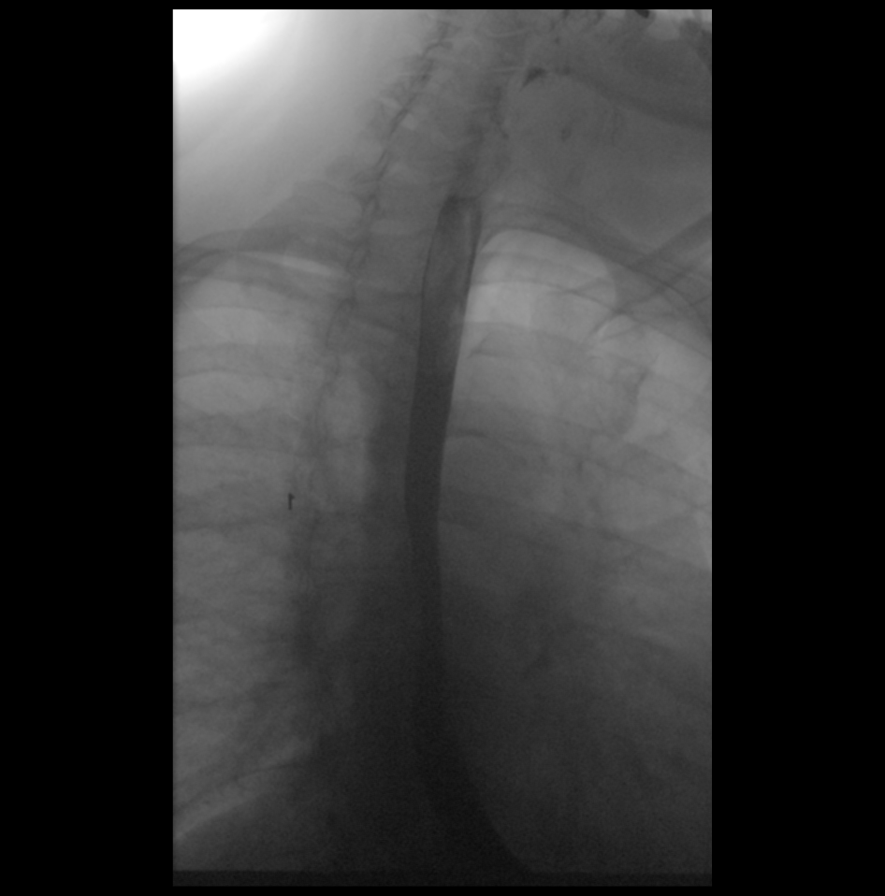

[Series 4: cp_standard · 0.26mm/px · 1 of 1 slices shown (3 of 3)]
[im 1/1]
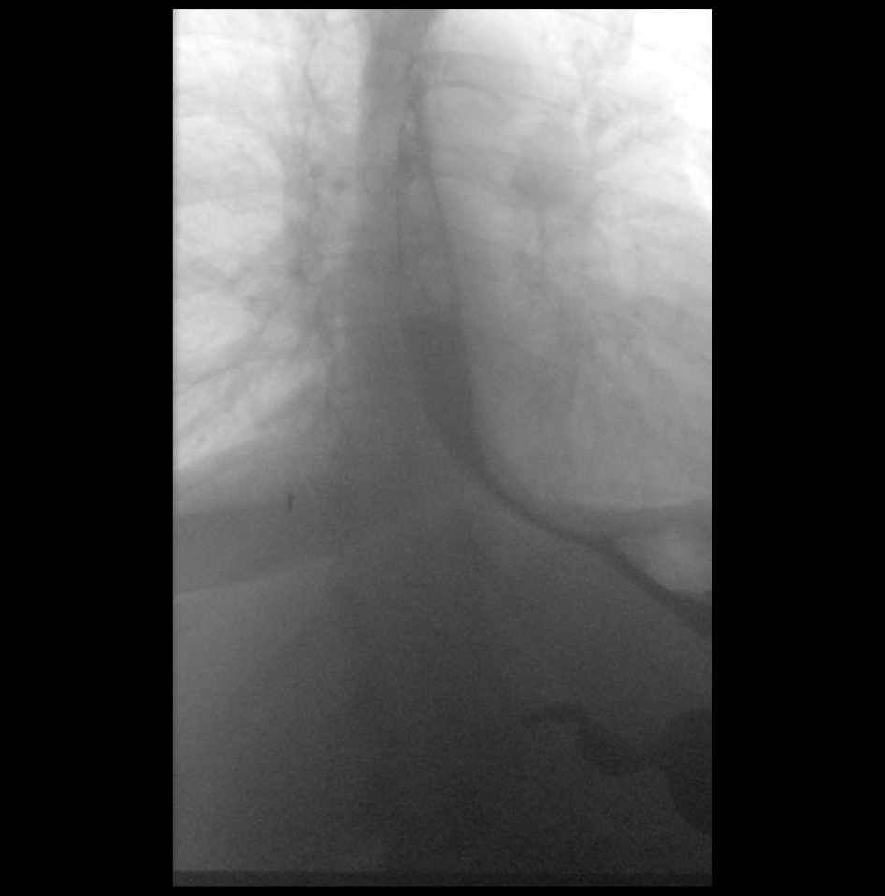

[Series 5: fluoro_barium singleshot_bw · 0.17mm/px · 1 of 1 slices shown (1 of 3)]
[im 1/1]
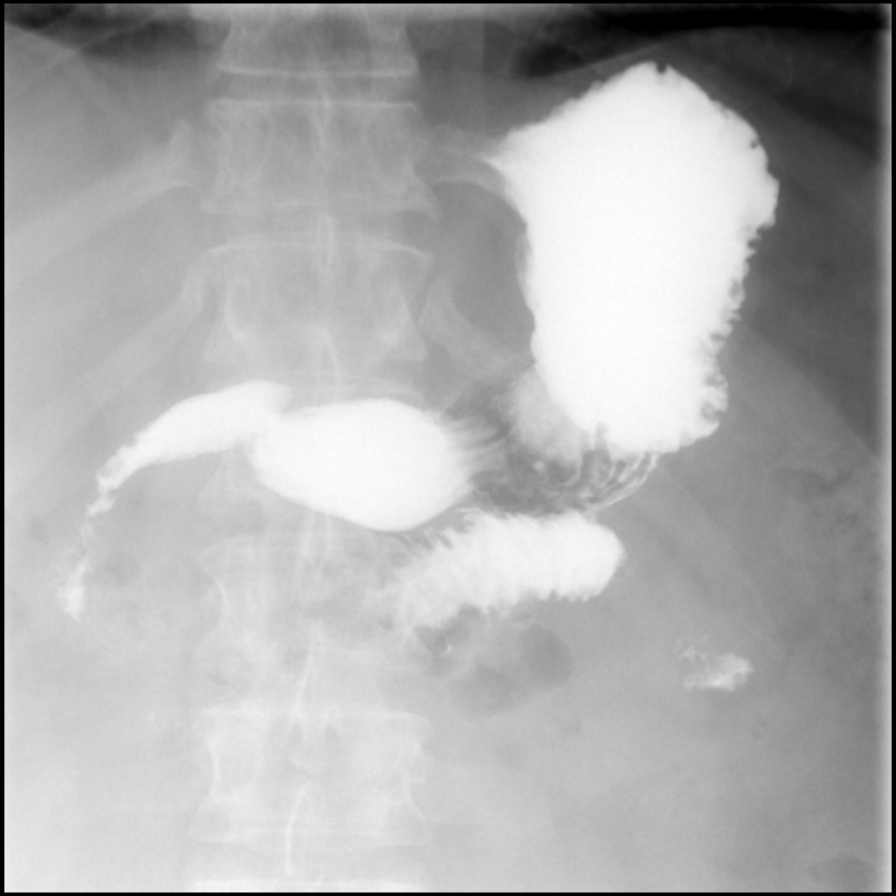

[Series 6: fluoro_barium singleshot_bw · 0.17mm/px · 1 of 1 slices shown (2 of 3)]
[im 1/1]
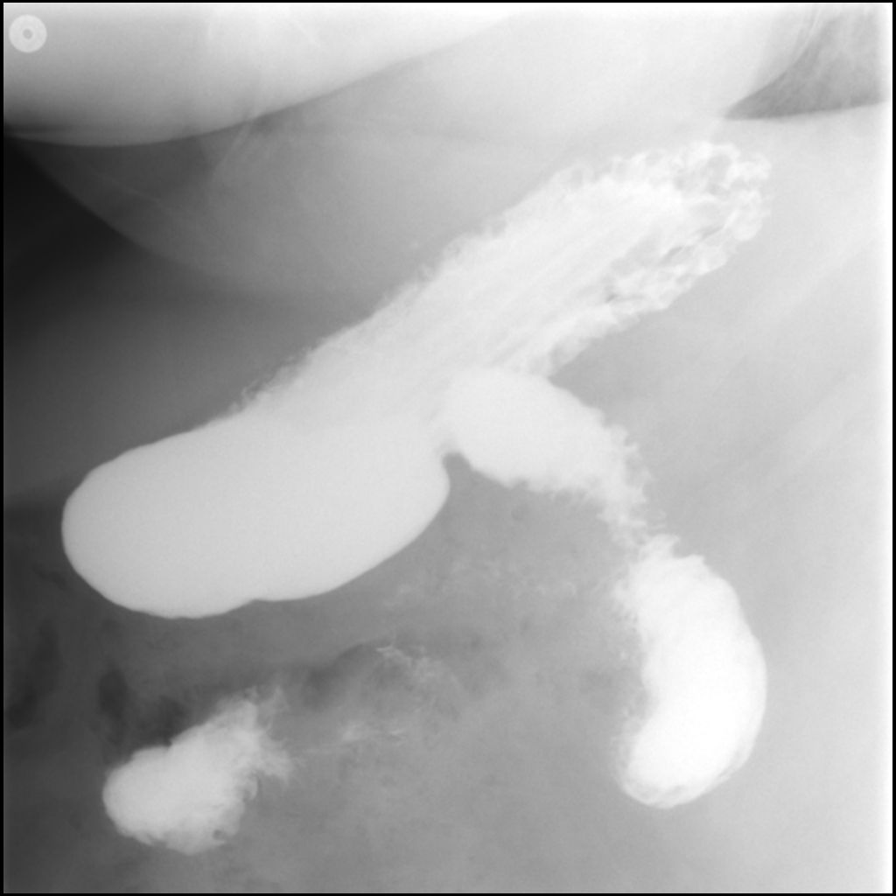

[Series 7: fluoro_barium singleshot_bw · 0.17mm/px · 1 of 1 slices shown (3 of 3)]
[im 1/1]
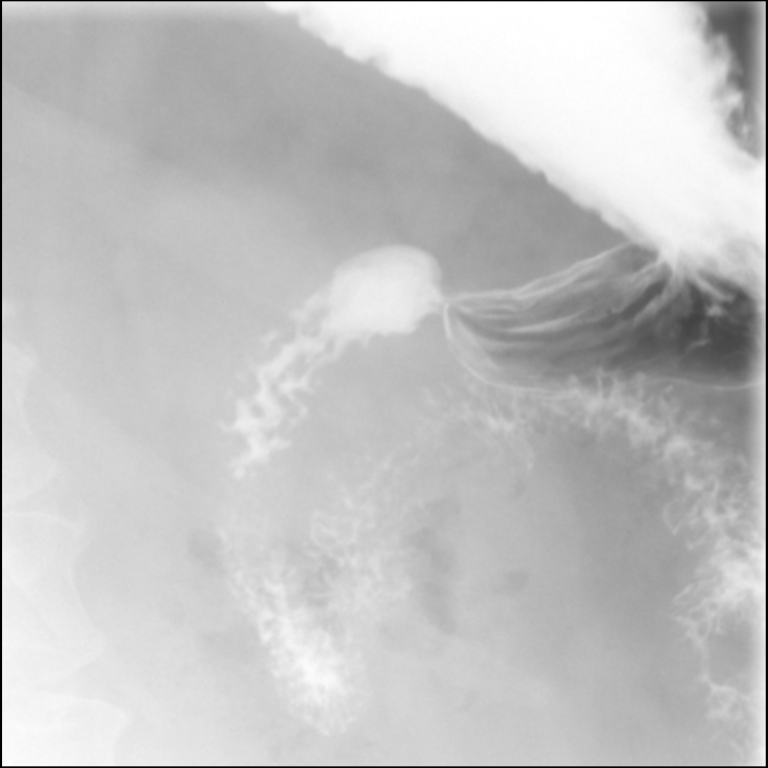

[7 of 7 positions shown; findings below may reference images not displayed]

FINDINGS: The esophagus is patent.  No stricture or mass identified.

The stomach is unremarkable. No hiatal hernia identified. The
duodenal bulb and duodenal C loop appear unremarkable.

No reflux.
IMPRESSION: 1. Normal preop bariatric surgery upper GI exam.

## 2020-06-05 ENCOUNTER — Encounter: Payer: No Typology Code available for payment source | Attending: Surgery | Admitting: Skilled Nursing Facility1

## 2020-06-05 ENCOUNTER — Other Ambulatory Visit: Payer: Self-pay

## 2020-06-07 NOTE — Progress Notes (Signed)
Follow-up visit:  Post-Operative sleeve Surgery  Medical Nutrition Therapy:  Appt start time: 6:00pm end time:  7:00pm  Primary concerns today: Post-operative Bariatric Surgery Nutrition Management 6 Month Post-Op Class  Sleeve Surgery Surgery Date: 10/24/2019    Anthropometrics  Start weight at [facility]: 325.5 lbs (date: 09/01/2019) Today's weight: 205.3 lbs   Body Composition Scale 06/05/2020  Weight  lbs 205.3  Total Body Fat  % 24.5     Visceral Fat 15  Fat-Free Mass  % 75.4     Total Body Water  % 56.4     Muscle-Mass  lbs 38.6  BMI 30.1  Body Fat Displacement ---        Torso  lbs 31.2        Left Leg  lbs 6.2        Right Leg  lbs 6.2        Left Arm  lbs 3.1        Right Arm  lbs 3.1     Information Reviewed/ Discussed During Appointment: -Review of composition scale numbers -Fluid requirements (64-100 ounces) -Protein requirements (60-80g) -Strategies for tolerating diet -Advancement of diet to include Starchy vegetables -Barriers to inclusion of new foods -Inclusion of appropriate multivitamin and calcium supplements  -Exercise recommendations   Fluid intake: adequate   Medications: See List Supplementation: appropriate    Using straws: no Drinking while eating: no Having you been chewing well: yes Chewing/swallowing difficulties: no Changes in vision: no Changes to mood/headaches: no Hair loss/Cahnges to skin/Changes to nails: no Any difficulty focusing or concentrating: no Sweating: no Dizziness/Lightheaded: no Palpitations: no  Carbonated beverages: no N/V/D/C/GAS: no Abdominal Pain: no Dumping syndrome: no  Recent physical activity:  ADL's  Progress Towards Goal(s):  In Progress  Handouts given during visit include:  Phase V diet Progression   Goals Sheet  The Benefits of Exercise are endless.....  Support Group Topics  Pt Chosen Goals:   Teaching Method Utilized:  Visual Auditory Hands on  Demonstrated degree of  understanding via:  Teach Back   Monitoring/Evaluation:  Dietary intake, exercise, and body weight. Follow up in 3 months for 9 month post-op visit.

## 2020-07-25 MED FILL — rOPINIRole HCL 2 MG TABS: 2 | 90 days supply | Qty: 90 | Fill #1

## 2020-08-13 MED FILL — VENLAFAXINE HCL ER 75 MG CA: 75 | 90 days supply | Qty: 90 | Fill #2

## 2020-09-03 ENCOUNTER — Encounter: Payer: No Typology Code available for payment source | Attending: Surgery | Admitting: Skilled Nursing Facility1

## 2020-09-03 ENCOUNTER — Other Ambulatory Visit: Payer: Self-pay

## 2020-09-03 NOTE — Progress Notes (Signed)
Follow-up visit:  Post-Operative sleeve Surgery   Primary concerns today: Post-operative Bariatric Surgery Nutrition Management  Sleeve Surgery Surgery Date: 10/24/2019    Anthropometrics  Start weight at [facility]: 325.5 lbs (date: 09/01/2019) Today's weight: 174.3 lbs   Body Composition Scale 06/05/2020 09/03/2020  Weight  lbs 205.3 174.3  Total Body Fat  % 24.5 15.5     Visceral Fat 15 10  Fat-Free Mass  % 75.4 84.4     Total Body Water  % 56.4 65.4     Muscle-Mass  lbs 38.6 32.7  BMI 30.1 25.5  Body Fat Displacement ---         Torso  lbs 31.2 16.7        Left Leg  lbs 6.2 3.3        Right Leg  lbs 6.2 3.3        Left Arm  lbs 3.1 1.6        Right Arm  lbs 3.1 1.6   Physical activity: walking 3 miles 7 days a week; personal trainer (core, balance, cardio)  Pt states his goal was more strength and balance which he feels he has really improved on. Pt states he is very conscious about what he puts in his body now.   24 hr recall:  Breakfast: egg muffin + onions + peppers+ sausage + cheese  Lunch: salad + meat + eggs + cheese Snack: more meat Dinner: meat + vegetable + sometimes potato or corn  Beverages: 48 oz water; 1-2 cups coffee, unsweet tea   Education: Why you need complex carbohydrates: Whole grains and other complex carbohydrates are required to have a healthy diet. Whole grains provide fiber which can help with blood glucose levels and help keep you satiated. Fruits and starchy vegetables provide essential vitamins and minerals required for immune function, eyesight support, brain support, bone density, wound healing and many other functions within the body. According to the current evidenced based 2020-2025 Dietary Guidelines for Americans, complex carbohydrates are part of a healthy eating pattern which is associated with a decreased risk for type 2 diabetes, cancers, and cardiovascular disease.   Fluid intake: adequate   Medications: See List Supplementation:  multi and calcium    Using straws: no Drinking while eating: no Having you been chewing well: yes Chewing/swallowing difficulties: no Changes in vision: no Changes to mood/headaches: no Hair loss/Cahnges to skin/Changes to nails: no Any difficulty focusing or concentrating: no Sweating: no Dizziness/Lightheaded: no Palpitations: no  Carbonated beverages: no N/V/D/C/GAS: hard stools but daily Abdominal Pain: no Dumping syndrome: no  Progress Towards Goal(s):  In Progress  Handouts given during visit include:  Bariatric MyPlate  Snack ideas  Pt Chosen Goals: -Add fruit daily and other complex carbs daily -Add beans to your salad   Teaching Method Utilized:  Visual Auditory Hands on  Demonstrated degree of understanding via:  Teach Back   Monitoring/Evaluation:  Dietary intake, exercise, and body weight.

## 2020-10-23 MED FILL — rOPINIRole HCL 2 MG TABS: 2 | 90 days supply | Qty: 90 | Fill #2

## 2020-10-23 MED FILL — VENLAFAXINE HCL ER 75 MG CA: 75 | 90 days supply | Qty: 90 | Fill #3

## 2021-01-17 ENCOUNTER — Other Ambulatory Visit: Payer: Self-pay | Admitting: Family Medicine

## 2021-01-17 DIAGNOSIS — F325 Major depressive disorder, single episode, in full remission: Secondary | ICD-10-CM

## 2021-01-17 MED FILL — rOPINIRole HCL 2 MG TABS: 2 | 90 days supply | Qty: 90 | Fill #3

## 2021-01-17 MED FILL — VENLAFAXINE HCL ER 75 MG CA: 75 | 90 days supply | Qty: 90 | Fill #0

## 2021-01-17 NOTE — Telephone Encounter (Signed)
Patient must see physician before any further refills

## 2021-02-13 ENCOUNTER — Other Ambulatory Visit (HOSPITAL_BASED_OUTPATIENT_CLINIC_OR_DEPARTMENT_OTHER): Payer: Self-pay

## 2021-03-07 ENCOUNTER — Other Ambulatory Visit: Payer: Self-pay

## 2021-03-07 ENCOUNTER — Ambulatory Visit (INDEPENDENT_AMBULATORY_CARE_PROVIDER_SITE_OTHER): Payer: No Typology Code available for payment source

## 2021-03-07 ENCOUNTER — Encounter: Payer: Self-pay | Admitting: Family Medicine

## 2021-03-07 ENCOUNTER — Ambulatory Visit (INDEPENDENT_AMBULATORY_CARE_PROVIDER_SITE_OTHER): Payer: No Typology Code available for payment source | Admitting: Family Medicine

## 2021-03-07 VITALS — BP 113/67 | HR 61 | Ht 69.0 in | Wt 171.2 lb

## 2021-03-07 DIAGNOSIS — Z114 Encounter for screening for human immunodeficiency virus [HIV]: Secondary | ICD-10-CM

## 2021-03-07 DIAGNOSIS — Z79899 Other long term (current) drug therapy: Secondary | ICD-10-CM

## 2021-03-07 DIAGNOSIS — Z9884 Bariatric surgery status: Secondary | ICD-10-CM | POA: Diagnosis not present

## 2021-03-07 DIAGNOSIS — Z1211 Encounter for screening for malignant neoplasm of colon: Secondary | ICD-10-CM

## 2021-03-07 DIAGNOSIS — G2581 Restless legs syndrome: Secondary | ICD-10-CM

## 2021-03-07 DIAGNOSIS — Z1212 Encounter for screening for malignant neoplasm of rectum: Secondary | ICD-10-CM

## 2021-03-07 DIAGNOSIS — R7303 Prediabetes: Secondary | ICD-10-CM

## 2021-03-07 DIAGNOSIS — N3943 Post-void dribbling: Secondary | ICD-10-CM | POA: Diagnosis not present

## 2021-03-07 DIAGNOSIS — G4700 Insomnia, unspecified: Secondary | ICD-10-CM

## 2021-03-07 DIAGNOSIS — Z23 Encounter for immunization: Secondary | ICD-10-CM

## 2021-03-07 DIAGNOSIS — Z0000 Encounter for general adult medical examination without abnormal findings: Secondary | ICD-10-CM

## 2021-03-07 DIAGNOSIS — F325 Major depressive disorder, single episode, in full remission: Secondary | ICD-10-CM

## 2021-03-07 DIAGNOSIS — E7800 Pure hypercholesterolemia, unspecified: Secondary | ICD-10-CM

## 2021-03-07 DIAGNOSIS — Z1159 Encounter for screening for other viral diseases: Secondary | ICD-10-CM

## 2021-03-07 LAB — POCT GLYCOSYLATED HEMOGLOBIN (HGB A1C): Hemoglobin A1C: 5.4 % (ref 4.0–5.6)

## 2021-03-07 NOTE — Patient Instructions (Addendum)
GREAT JOB!  You are the first patient I have had who has completed the bariatric surgery process.  What a success.   You received your second Covid Booster vaccination today.  We are checking your electrolyte, kidneys, lipids and screening you for Hepatitis C virus.   A referral GI should contact you within the week.  If they do not, please let me know through [place].   Let Dr Meriah Shands know if you decide to come off your Effexor.  We will need to plan a slow taper off of it.

## 2021-03-07 NOTE — Assessment & Plan Note (Addendum)
Established problem Incredible response in weight reduction Jonathan Rivas reports an increase in his energy levels He is exercising with aerobics and resistance workouts Taking micronutrient supplements as directed

## 2021-03-07 NOTE — Assessment & Plan Note (Addendum)
Referral GI for CRC discussion.  Discussed role of PreP therapy in prevention of HIV infection

## 2021-03-07 NOTE — Assessment & Plan Note (Signed)
Lab Results  Component Value Date   HGBA1C 5.4 03/07/2021   No longer in At-Risk category for diabetes after sleeve gastrectomy

## 2021-03-07 NOTE — Assessment & Plan Note (Signed)
Established problem PHQ9 SCORE ONLY 03/07/2021 09/01/2019 09/25/2017  PHQ-9 Total Score 0 0 0    Well Controlled. No signs of complications, medication side effects, or red flags. Continue current medications and other regiments. We discussed option of trial of tapering off of Effexor - Jonathan Rivas will consider this option. He will message me should he wish to try tapering off.

## 2021-03-07 NOTE — Assessment & Plan Note (Signed)
Pending lipid panel 

## 2021-03-07 NOTE — Assessment & Plan Note (Signed)
Established problem Well Controlled. No signs of complications, medication side effects, or red flags. Continue current medications and other regiments.  

## 2021-03-08 LAB — BASIC METABOLIC PANEL
BUN/Creatinine Ratio: 15 (ref 9–20)
BUN: 13 mg/dL (ref 6–24)
CO2: 26 mmol/L (ref 20–29)
Calcium: 9.9 mg/dL (ref 8.7–10.2)
Chloride: 102 mmol/L (ref 96–106)
Creatinine, Ser: 0.84 mg/dL (ref 0.76–1.27)
Glucose: 92 mg/dL (ref 65–99)
Potassium: 5.4 mmol/L — ABNORMAL HIGH (ref 3.5–5.2)
Sodium: 142 mmol/L (ref 134–144)
eGFR: 107 mL/min/{1.73_m2} (ref >59)

## 2021-03-08 LAB — HIV ANTIBODY (ROUTINE TESTING W REFLEX): HIV Screen 4th Generation wRfx: NONREACTIVE

## 2021-03-08 LAB — LIPID PANEL
Chol/HDL Ratio: 2.7 ratio (ref 0.0–5.0)
Cholesterol, Total: 197 mg/dL (ref 100–199)
HDL: 72 mg/dL (ref >39)
LDL Chol Calc (NIH): 107 mg/dL — ABNORMAL HIGH (ref 0–99)
Triglycerides: 100 mg/dL (ref 0–149)
VLDL Cholesterol Cal: 18 mg/dL (ref 5–40)

## 2021-03-08 LAB — HEPATITIS C ANTIBODY: Hep C Virus Ab: <0.1 s/co ratio (ref 0.0–0.9)

## 2021-03-11 ENCOUNTER — Ambulatory Visit: Payer: No Typology Code available for payment source

## 2021-03-12 ENCOUNTER — Encounter: Payer: Self-pay | Admitting: Gastroenterology

## 2021-04-15 ENCOUNTER — Other Ambulatory Visit: Payer: Self-pay | Admitting: Family Medicine

## 2021-04-15 ENCOUNTER — Other Ambulatory Visit (HOSPITAL_COMMUNITY): Payer: Self-pay

## 2021-04-15 DIAGNOSIS — G2581 Restless legs syndrome: Secondary | ICD-10-CM

## 2021-04-15 DIAGNOSIS — F325 Major depressive disorder, single episode, in full remission: Secondary | ICD-10-CM

## 2021-04-15 MED ORDER — ROPINIROLE HCL 2 MG PO TABS
ORAL_TABLET | Freq: Every day | ORAL | 3 refills | Status: DC
  Filled 2021-04-15: qty 90, 90d supply, fill #0
  Filled 2021-07-05: qty 90, 90d supply, fill #1
  Filled 2021-09-24: qty 90, 90d supply, fill #2
  Filled 2021-12-24: qty 90, 90d supply, fill #3

## 2021-04-15 MED ORDER — VENLAFAXINE HCL ER 75 MG PO CP24
ORAL_CAPSULE | ORAL | 3 refills | Status: DC
  Filled 2021-04-15: qty 90, 90d supply, fill #0
  Filled 2021-07-05: qty 90, 90d supply, fill #1
  Filled 2021-09-24: qty 90, 90d supply, fill #2
  Filled 2021-12-24: qty 90, 90d supply, fill #3

## 2021-05-17 ENCOUNTER — Other Ambulatory Visit: Payer: Self-pay

## 2021-05-17 ENCOUNTER — Ambulatory Visit (AMBULATORY_SURGERY_CENTER): Payer: No Typology Code available for payment source | Admitting: *Deleted

## 2021-05-17 ENCOUNTER — Other Ambulatory Visit (HOSPITAL_COMMUNITY): Payer: Self-pay

## 2021-05-17 VITALS — Ht 69.0 in | Wt 168.0 lb

## 2021-05-17 DIAGNOSIS — Z1211 Encounter for screening for malignant neoplasm of colon: Secondary | ICD-10-CM

## 2021-05-17 MED ORDER — SUPREP BOWEL PREP KIT 17.5-3.13-1.6 GM/177ML PO SOLN
1.0000 | Freq: Once | ORAL | 0 refills | Status: AC
  Filled 2021-05-17: qty 354, 1d supply, fill #0

## 2021-05-17 NOTE — Progress Notes (Signed)

## 2021-06-07 ENCOUNTER — Ambulatory Visit (AMBULATORY_SURGERY_CENTER): Payer: No Typology Code available for payment source | Admitting: Gastroenterology

## 2021-06-07 ENCOUNTER — Other Ambulatory Visit: Payer: Self-pay

## 2021-06-07 ENCOUNTER — Encounter: Payer: Self-pay | Admitting: Gastroenterology

## 2021-06-07 VITALS — BP 111/58 | HR 63 | Temp 98.9°F | Resp 12 | Ht 69.0 in | Wt 168.0 lb

## 2021-06-07 DIAGNOSIS — Z1211 Encounter for screening for malignant neoplasm of colon: Secondary | ICD-10-CM | POA: Diagnosis not present

## 2021-06-07 DIAGNOSIS — D125 Benign neoplasm of sigmoid colon: Secondary | ICD-10-CM

## 2021-06-07 DIAGNOSIS — K5730 Diverticulosis of large intestine without perforation or abscess without bleeding: Secondary | ICD-10-CM

## 2021-06-07 DIAGNOSIS — K640 First degree hemorrhoids: Secondary | ICD-10-CM

## 2021-06-07 HISTORY — PX: COLONOSCOPY: SHX174

## 2021-06-07 MED ORDER — SODIUM CHLORIDE 0.9 % IV SOLN: 500.0000 mL | Freq: Once | INTRAVENOUS | Status: DC

## 2021-06-07 NOTE — Progress Notes (Signed)
Pt's states no medical or surgical changes since previsit or office visit.   [place] 

## 2021-06-07 NOTE — Progress Notes (Signed)
Report given to PACU, vss 

## 2021-06-07 NOTE — Progress Notes (Signed)
Called to room to assist during endoscopic procedure.  Patient ID and intended procedure confirmed with present staff. Received instructions for my participation in the procedure from the performing physician.  

## 2021-06-07 NOTE — Patient Instructions (Signed)
YOU HAD AN ENDOSCOPIC PROCEDURE TODAY AT [facility]:   Refer to the procedure report that was given to you for any specific questions about what was found during the examination.  If the procedure report does not answer your questions, please call your gastroenterologist to clarify.  If you requested that your care partner not be given the details of your procedure findings, then the procedure report has been included in a sealed envelope for you to review at your convenience later.  YOU SHOULD EXPECT: Some feelings of bloating in the abdomen. Passage of more gas than usual.  Walking can help get rid of the air that was put into your GI tract during the procedure and reduce the bloating. If you had a lower endoscopy (such as a colonoscopy or flexible sigmoidoscopy) you may notice spotting of blood in your stool or on the toilet paper. If you underwent a bowel prep for your procedure, you may not have a normal bowel movement for a few days.  Please Note:  You might notice some irritation and congestion in your nose or some drainage.  This is from the oxygen used during your procedure.  There is no need for concern and it should clear up in a day or so.  SYMPTOMS TO REPORT IMMEDIATELY:  Following lower endoscopy (colonoscopy or flexible sigmoidoscopy):  Excessive amounts of blood in the stool  Significant tenderness or worsening of abdominal pains  Swelling of the abdomen that is new, acute  Fever of 100F or higher  For urgent or emergent issues, a gastroenterologist can be reached at any hour by calling [phone]. Do not use MyChart messaging for urgent concerns.    DIET:  We do recommend a small meal at first, but then you may proceed to your regular diet.  Drink plenty of fluids but you should avoid alcoholic beverages for 24 hours.  ACTIVITY:  You should plan to take it easy for the rest of today and you should NOT DRIVE or use heavy machinery until tomorrow (because of  the sedation medicines used during the test).    FOLLOW UP: Our staff will call the number listed on your records 48-72 hours following your procedure to check on you and address any questions or concerns that you may have regarding the information given to you following your procedure. If we do not reach you, we will leave a message.  We will attempt to reach you two times.  During this call, we will ask if you have developed any symptoms of COVID 19. If you develop any symptoms (ie: fever, flu-like symptoms, shortness of breath, cough etc.) before then, please call [phone].  If you test positive for Covid 19 in the 2 weeks post procedure, please call and report this information to [place].    If any biopsies were taken you will be contacted by phone or by letter within the next 1-3 weeks.  Please call us at [phone] if you have not heard about the biopsies in 3 weeks.    SIGNATURES/CONFIDENTIALITY: You and/or your care partner have signed paperwork which will be entered into your electronic medical record.  These signatures attest to the fact that that the information above on your After Visit Summary has been reviewed and is understood.  Full responsibility of the confidentiality of this discharge information lies with you and/or your care-partner.    RESUME MEDICATIONS.  INFORMATION GIVEN ON POLYPS,DIVERTICULOSIS AND HEMORRHOIDS. REFER TO PROCEDURE REPORT FOR RECOMMENDATIONS.

## 2021-06-07 NOTE — Op Note (Signed)
[facility] Patient Name: Jonathan Rivas Procedure Date: 06/07/2021 8:17 AM MRN: [id number] Endoscopist: Gerrit Heck , MD Age: 50 Referring MD:  Date of Birth: 1970-12-02 Gender: Male Account #: [id number] Procedure:                Colonoscopy Indications:              Screening for colorectal malignant neoplasm, This                            is the patient's first colonoscopy Medicines:                Monitored Anesthesia Care Procedure:                Pre-Anesthesia Assessment:                           - Prior to the procedure, a History and Physical                            was performed, and patient medications and                            allergies were reviewed. The patient's tolerance of                            previous anesthesia was also reviewed. The risks                            and benefits of the procedure and the sedation                            options and risks were discussed with the patient.                            All questions were answered, and informed consent                            was obtained. Prior Anticoagulants: The patient has                            taken no previous anticoagulant or antiplatelet                            agents. ASA Grade Assessment: II - A patient with                            mild systemic disease. After reviewing the risks                            and benefits, the patient was deemed in                            satisfactory condition to undergo the procedure.  After obtaining informed consent, the colonoscope                            was passed under direct vision. Throughout the                            procedure, the patient's blood pressure, pulse, and                            oxygen saturations were monitored continuously. The                            CF-HQ190L was introduced through the anus and                            advanced to the the terminal  ileum. The colonoscopy                            was performed without difficulty. The patient                            tolerated the procedure well. The quality of the                            bowel preparation was good. The terminal ileum,                            ileocecal valve, appendiceal orifice, and rectum                            were photographed. Scope In: 8:29:49 AM Scope Out: 8:50:27 AM Scope Withdrawal Time: 0 hours 17 minutes 46 seconds  Total Procedure Duration: 0 hours 20 minutes 38 seconds  Findings:                 Skin tags were found on perianal exam.                           Two sessile polyps were found in the distal sigmoid                            colon. The polyps were 5 to 8 mm in size. These                            polyps were removed with a cold snare. Resection                            and retrieval were complete. Estimated blood loss                            was minimal.                           A single small-mouthed diverticulum was found in  the sigmoid colon.                           Non-bleeding internal hemorrhoids were found during                            retroflexion. The hemorrhoids were small.                           The terminal ileum appeared normal. Complications:            No immediate complications. Estimated Blood Loss:     Estimated blood loss was minimal. Impression:               - Perianal skin tags found on perianal exam.                           - Two 5 to 8 mm polyps in the distal sigmoid colon,                            removed with a cold snare. Resected and retrieved.                           - Diverticulosis in the sigmoid colon.                           - Non-bleeding internal hemorrhoids.                           - The examined portion of the ileum was normal. Recommendation:           - Patient has a contact number available for                            emergencies.  The signs and symptoms of potential                            delayed complications were discussed with the                            patient. Return to normal activities tomorrow.                            Written discharge instructions were provided to the                            patient.                           - Resume previous diet.                           - Continue present medications.                           - Await pathology results.                           -  Repeat colonoscopy for surveillance based on                            pathology results.                           - Return to GI office PRN.                           - Use fiber, for example Citrucel, Fibercon, Konsyl                            or Metamucil.                           - Internal hemorrhoids were noted on this study and                            may be amenable to hemorrhoid band ligation. If you                            are interested in further treatment of these                            hemorrhoids with band ligation, please contact my                            clinic to set up an appointment for evaluation and                            treatment. Gerrit Heck, MD 06/07/2021 8:54:11 AM

## 2021-06-11 ENCOUNTER — Telehealth: Payer: Self-pay | Admitting: *Deleted

## 2021-06-11 NOTE — Telephone Encounter (Signed)
  Follow up Call-  Call back number 06/07/2021  Post procedure Call Back phone  # [phone]  Permission to leave phone message Yes  Some recent data might be hidden     Patient questions:  Do you have a fever, pain , or abdominal swelling? No. Pain Score  0 *  Have you tolerated food without any problems? Yes.    Have you been able to return to your normal activities? Yes.    Do you have any questions about your discharge instructions: Diet   No. Medications  No. Follow up visit  No.  Do you have questions or concerns about your Care? No.  Actions: * If pain score is 4 or above: No action needed, pain <4.  Have you developed a fever since your procedure? no  2.   Have you had an respiratory symptoms (SOB or cough) since your procedure? no  3.   Have you tested positive for COVID 19 since your procedure no  4.   Have you had any family members/close contacts diagnosed with the COVID 19 since your procedure?  no   If yes to any of these questions please route to Joylene John, RN and Joella Prince, RN

## 2021-06-13 ENCOUNTER — Encounter: Payer: Self-pay | Admitting: Gastroenterology

## 2021-06-14 ENCOUNTER — Encounter: Payer: Self-pay | Admitting: Family Medicine

## 2021-06-14 DIAGNOSIS — D126 Benign neoplasm of colon, unspecified: Secondary | ICD-10-CM

## 2021-06-14 HISTORY — DX: Benign neoplasm of colon, unspecified: D12.6

## 2021-06-19 ENCOUNTER — Encounter (HOSPITAL_COMMUNITY): Payer: Self-pay | Admitting: *Deleted

## 2021-07-05 ENCOUNTER — Other Ambulatory Visit (HOSPITAL_COMMUNITY): Payer: Self-pay

## 2021-09-11 ENCOUNTER — Encounter: Payer: Self-pay | Admitting: Family Medicine

## 2021-09-24 ENCOUNTER — Other Ambulatory Visit (HOSPITAL_COMMUNITY): Payer: Self-pay

## 2021-09-25 ENCOUNTER — Other Ambulatory Visit (HOSPITAL_COMMUNITY): Payer: Self-pay

## 2021-10-31 ENCOUNTER — Encounter: Payer: Self-pay | Admitting: Family Medicine

## 2021-10-31 ENCOUNTER — Other Ambulatory Visit (HOSPITAL_COMMUNITY): Payer: Self-pay

## 2021-11-24 HISTORY — PX: BUNIONECTOMY: SHX129

## 2021-12-24 ENCOUNTER — Other Ambulatory Visit (HOSPITAL_COMMUNITY): Payer: Self-pay

## 2022-03-12 ENCOUNTER — Encounter: Payer: Self-pay | Admitting: Family Medicine

## 2022-03-12 ENCOUNTER — Other Ambulatory Visit (HOSPITAL_COMMUNITY): Payer: Self-pay

## 2022-03-12 ENCOUNTER — Ambulatory Visit (INDEPENDENT_AMBULATORY_CARE_PROVIDER_SITE_OTHER): Payer: No Typology Code available for payment source | Admitting: Family Medicine

## 2022-03-12 ENCOUNTER — Ambulatory Visit (INDEPENDENT_AMBULATORY_CARE_PROVIDER_SITE_OTHER): Payer: No Typology Code available for payment source

## 2022-03-12 VITALS — BP 118/70 | HR 90 | Ht 69.0 in | Wt 189.6 lb

## 2022-03-12 DIAGNOSIS — G2581 Restless legs syndrome: Secondary | ICD-10-CM | POA: Diagnosis not present

## 2022-03-12 DIAGNOSIS — F325 Major depressive disorder, single episode, in full remission: Secondary | ICD-10-CM | POA: Diagnosis not present

## 2022-03-12 DIAGNOSIS — J309 Allergic rhinitis, unspecified: Secondary | ICD-10-CM | POA: Diagnosis not present

## 2022-03-12 DIAGNOSIS — K9089 Other intestinal malabsorption: Secondary | ICD-10-CM

## 2022-03-12 DIAGNOSIS — R7303 Prediabetes: Secondary | ICD-10-CM

## 2022-03-12 DIAGNOSIS — E875 Hyperkalemia: Secondary | ICD-10-CM | POA: Diagnosis not present

## 2022-03-12 DIAGNOSIS — Z23 Encounter for immunization: Secondary | ICD-10-CM

## 2022-03-12 DIAGNOSIS — Z9884 Bariatric surgery status: Secondary | ICD-10-CM

## 2022-03-12 LAB — POCT GLYCOSYLATED HEMOGLOBIN (HGB A1C): Hemoglobin A1C: 5.4 % (ref 4.0–5.6)

## 2022-03-12 MED ORDER — VENLAFAXINE HCL ER 75 MG PO CP24
ORAL_CAPSULE | ORAL | 3 refills | Status: DC
  Filled 2022-03-12: qty 90, 90d supply, fill #0
  Filled 2022-06-06: qty 90, 90d supply, fill #1

## 2022-03-12 MED ORDER — ROPINIROLE HCL 2 MG PO TABS
ORAL_TABLET | Freq: Every day | ORAL | 3 refills | Status: DC
  Filled 2022-03-12: qty 90, 90d supply, fill #0
  Filled 2022-06-06: qty 90, 90d supply, fill #1
  Filled 2022-08-20 – 2022-08-25 (×3): qty 90, 90d supply, fill #2
  Filled 2022-11-10: qty 90, 90d supply, fill #3

## 2022-03-12 NOTE — Assessment & Plan Note (Signed)
Established problem Well Controlled. No signs of complications, medication side effects, or red flags. Continue current medications and other regiments.  

## 2022-03-12 NOTE — Patient Instructions (Addendum)
Please continue your Effxor and ropinirole.  Should you decide to stop the Effexor, please let Dr Tessica Cupo know so we can come up with a slow reduction in the doses.   ?The class of medications to which Effexor belongs is associated with Restless Leg Syndrome.  ? ?Consider purchasing a Tennis Elbow strap to use when you are working vigorously with your arms, or when you are having a flare of pain in the arm.  ?If you continue to have unaccepatble discomfort, let Dr Eveleigh Crumpler know so we can get you to the Sport Medicine docs to Ultrasound the painful area to see if there is soft tissue injuries or superficial bone injuries.  ? ?Consider getting a "Green" sports insoles for your shoes to support your arch. ? ?Should you decide you would like to consult with a podiatrist about your Bunion, and need a referral, send me a message in [place].  ? ?Make sure to check out at the front desk before you leave. ? ?If you had blood tests today, Dr Mystic Labo will send you a MyChart message or a letter if the results are normal.  Otherwise, he will give you a call.  ? ?If Dr Makinlee Awwad ordered you to have a referral, you should hear from the referral's office to schedule an appointment.   ? ?If you have not heard from the referral's office within 5 business-days, please let Dr Maiya Kates know by sending him a message through [place], or calling the [facility] [phone]) to leave him a message.  ? ? ? ? ?SHINGLES VACCINATION ?Chickenpox and shingles are related because they are caused by the same virus (varicella-zoster virus). After a person recovers from chickenpox, the virus stays dormant (inactive) in the body. It can reactivate years later and cause shingles. ? ?CDC recommends that adults 14 years and older get two doses of the shingles vaccine called Shingrix (recombinant zoster vaccine) to prevent shingles and the complications from the disease. ? ?Shingrix vaccination provides strong protection  against shingles. In adults 50 years Shingrix is more than 90% effective at preventing shingles. ? ?Studies show that Shingrix is safe. The vaccine helps your body create a strong defense against shingles. As a result, you are likely to have temporary side effects from getting the shots. Some people feel they are having a mild flu without the head cold symptoms.  The side effects might affect your ability to do normal daily activities for 2 to 3 days.  Getting the shot when you have a couple days without commitments is a good idea, in case you do feel under the weather. ? ?Medicare prescription drug plans (Part D) usually cover all available vaccines needed to prevent illness, like the shingles (Shingrix) shot. Contact your Medicare drug plan If you are uncertain if they will cover the shingles vaccination.  ? ?Your pharmacist can give you Shingrix as a shot in your upper arm. ? ? ? ? ? ?Information about Pre-Exposure Prophylaxis in reducing the your changes of getting HIV from sexual relations is available at the [facility] website [email or web address] ? ?

## 2022-03-12 NOTE — Assessment & Plan Note (Signed)
Lab Results  ?Component Value Date  ? HGBA1C 5.4 03/12/2022  ? ? ?Established problem ?No longer in At-Risk of Diabetes range.  ?Monitor A1c periodically and should there be significant change in wts or lifestyle. ?

## 2022-03-12 NOTE — Assessment & Plan Note (Signed)
Established problem ?Well Controlled  ?Taking Claritin daily ?No signs of complications, medication side effects, or red flags. ?Continue current medication ? ?

## 2022-03-12 NOTE — Assessment & Plan Note (Signed)
Wt Readings from Last 3 Encounters:  ?03/12/22 189 lb 9.6 oz (86 kg)  ?06/07/21 168 lb (76.2 kg)  ?05/17/21 168 lb (76.2 kg)  ? ?Active, walking.  Feels well.  ?No intolerance of sleeve ?Taking daily bariatric MVI ?Checking Iron studies and Vit B12 per Bariatric mngt monitoring.  ? ?

## 2022-03-12 NOTE — Assessment & Plan Note (Signed)
?    03/12/2022  ?  8:33 AM 03/07/2021  ?  8:26 AM 09/01/2019  ?  2:04 PM  ?PHQ9 SCORE ONLY  ?PHQ-9 Total Score 0 0 0  ? ?Established problem ?Well Controlled and is at goal of low PHQ9 and good functioning. ?No signs of complications, medication side effects, or red flags. ?Continue Effexor XL 75 mg daily ?Discussed option of titrating off medication given possible contribution of SNRI to RLS. ? ?

## 2022-03-12 NOTE — Progress Notes (Signed)
Jonathan Rivas is alone ?Sources of clinical information for visit is/are patient. ?Nursing assessment for this office visit was reviewed with the patient for accuracy and revision.  ? ? ? ?Previous Report(s) Reviewed: none  ? ?  03/12/2022  ?  8:33 AM  ?Depression screen PHQ 2/9  ?Decreased Interest 0  ?Down, Depressed, Hopeless 0  ?PHQ - 2 Score 0  ?Altered sleeping 0  ?Tired, decreased energy 0  ?Change in appetite 0  ?Feeling bad or failure about yourself  0  ?Trouble concentrating 0  ?Moving slowly or fidgety/restless 0  ?Suicidal thoughts 0  ?PHQ-9 Score 0  ?Difficult doing work/chores Not difficult at all  ? ?[place] Office Visit from 03/12/2022 in [facility] Office Visit from 03/07/2021 in [facility] Office Visit from 09/25/2016 in [facility]  ?Thoughts that you would be better off dead, or of hurting yourself in some way Not at all Not at all Not at all  ?PHQ-9 Total Score 0 0 1  ? ?  ?  ? ?  09/01/2019  ?  2:04 PM 02/08/2016  ?  1:35 PM  ?Fall Risk   ?Falls in the past year? 0 No  ? ? ? ?  03/12/2022  ?  8:33 AM 03/07/2021  ?  8:26 AM 09/01/2019  ?  2:04 PM  ?PHQ9 SCORE ONLY  ?PHQ-9 Total Score 0 0 0  ? ? ?Adult vaccines due  ?Topic Date Due  ? TETANUS/TDAP  09/25/2026  ? ? ?Health Maintenance Due  ?Topic Date Due  ? Zoster Vaccines- Shingrix (1 of 2) Never done  ?  ? ? ?History/P.E. limitations: none ? ?Adult vaccines due  ?Topic Date Due  ? TETANUS/TDAP  09/25/2026  ? There are no preventive care reminders to display for this patient.  ?Health Maintenance Due  ?Topic Date Due  ? Zoster Vaccines- Shingrix (1 of 2) Never done  ?  ? ?Chief Complaint  ?Patient presents with  ? Medication Management  ?  ? ?

## 2022-03-13 LAB — BASIC METABOLIC PANEL
BUN/Creatinine Ratio: 25 — ABNORMAL HIGH (ref 9–20)
BUN: 21 mg/dL (ref 6–24)
CO2: 27 mmol/L (ref 20–29)
Calcium: 9.7 mg/dL (ref 8.7–10.2)
Chloride: 104 mmol/L (ref 96–106)
Creatinine, Ser: 0.85 mg/dL (ref 0.76–1.27)
Glucose: 91 mg/dL (ref 70–99)
Potassium: 5.2 mmol/L (ref 3.5–5.2)
Sodium: 143 mmol/L (ref 134–144)
eGFR: 106 mL/min/{1.73_m2} (ref >59)

## 2022-03-13 LAB — VITAMIN B12: Vitamin B-12: 685 pg/mL (ref 232–1245)

## 2022-03-13 LAB — FERRITIN: Ferritin: 179 ng/mL (ref 30–400)

## 2022-03-17 ENCOUNTER — Other Ambulatory Visit (HOSPITAL_COMMUNITY): Payer: Self-pay

## 2022-04-29 ENCOUNTER — Encounter: Payer: Self-pay | Admitting: *Deleted

## 2022-05-05 ENCOUNTER — Encounter: Payer: Self-pay | Admitting: Family Medicine

## 2022-05-25 ENCOUNTER — Encounter: Payer: Self-pay | Admitting: Emergency Medicine

## 2022-05-25 ENCOUNTER — Emergency Department
Admission: EM | Admit: 2022-05-25 | Discharge: 2022-05-25 | Discharge status: Against Medical Advice | Payer: No Typology Code available for payment source | Attending: Emergency Medicine | Admitting: Emergency Medicine

## 2022-05-25 DIAGNOSIS — Z5321 Procedure and treatment not carried out due to patient leaving prior to being seen by health care provider: Secondary | ICD-10-CM | POA: Insufficient documentation

## 2022-05-25 DIAGNOSIS — R55 Syncope and collapse: Secondary | ICD-10-CM | POA: Diagnosis present

## 2022-05-25 DIAGNOSIS — R42 Dizziness and giddiness: Secondary | ICD-10-CM | POA: Insufficient documentation

## 2022-05-25 LAB — BASIC METABOLIC PANEL
Anion gap: 6 (ref 5–15)
BUN: 11 mg/dL (ref 6–20)
CO2: 25 mmol/L (ref 22–32)
Calcium: 8.3 mg/dL — ABNORMAL LOW (ref 8.9–10.3)
Chloride: 109 mmol/L (ref 98–111)
Creatinine, Ser: 0.85 mg/dL (ref 0.61–1.24)
GFR, Estimated: >60 mL/min (ref >60)
Glucose, Bld: 153 mg/dL — ABNORMAL HIGH (ref 70–99)
Potassium: 3.3 mmol/L — ABNORMAL LOW (ref 3.5–5.1)
Sodium: 140 mmol/L (ref 135–145)

## 2022-05-25 LAB — CBC
HCT: 41.6 % (ref 39.0–52.0)
Hemoglobin: 13.3 g/dL (ref 13.0–17.0)
MCH: 30.7 pg (ref 26.0–34.0)
MCHC: 32 g/dL (ref 30.0–36.0)
MCV: 96.1 fL (ref 80.0–100.0)
Platelets: 213 10*3/uL (ref 150–400)
RBC: 4.33 MIL/uL (ref 4.22–5.81)
RDW: 13.5 % (ref 11.5–15.5)
WBC: 9.7 10*3/uL (ref 4.0–10.5)
nRBC: 0 % (ref 0.0–0.2)

## 2022-05-25 LAB — TROPONIN I (HIGH SENSITIVITY): Troponin I (High Sensitivity): <2 ng/L (ref <18)

## 2022-05-25 NOTE — ED Notes (Signed)
Pt leaving without being seen, advised pt to follow up with PCP and return to ED if sxs worse. Pt verbalized understanding. Pt left with partner.

## 2022-05-25 NOTE — ED Triage Notes (Signed)
Pt arrived via [facility] from home where pt had a syncopal episode tonight where pt stood from futon and felt lightheaded so pt sat back down, waking up lying on his back, on futon. Pt denies BP hx or recent changes to medication. Pt denies hitting head and denies pain. Pts initial BP with EMS 87/50. In route pt received 518m NS.

## 2022-05-26 LAB — URINALYSIS, ROUTINE W REFLEX MICROSCOPIC
Bilirubin Urine: NEGATIVE
Glucose, UA: NEGATIVE mg/dL
Hgb urine dipstick: NEGATIVE
Ketones, ur: 5 mg/dL — AB
Leukocytes,Ua: NEGATIVE
Nitrite: NEGATIVE
Protein, ur: 100 mg/dL — AB
Specific Gravity, Urine: 1.026 (ref 1.005–1.030)
pH: 5 (ref 5.0–8.0)

## 2022-06-05 ENCOUNTER — Encounter: Payer: Self-pay | Admitting: Family Medicine

## 2022-06-05 ENCOUNTER — Ambulatory Visit (INDEPENDENT_AMBULATORY_CARE_PROVIDER_SITE_OTHER): Payer: No Typology Code available for payment source | Admitting: Family Medicine

## 2022-06-05 ENCOUNTER — Other Ambulatory Visit (HOSPITAL_COMMUNITY)
Admission: RE | Admit: 2022-06-05 | Discharge: 2022-06-05 | Disposition: A | Discharge status: Home / Self Care | Payer: No Typology Code available for payment source | Source: Ambulatory Visit | Attending: Family Medicine | Admitting: Family Medicine

## 2022-06-05 VITALS — BP 105/66 | HR 75 | Ht 69.0 in | Wt 191.1 lb

## 2022-06-05 DIAGNOSIS — Z1159 Encounter for screening for other viral diseases: Secondary | ICD-10-CM

## 2022-06-05 DIAGNOSIS — Z7252 High risk homosexual behavior: Secondary | ICD-10-CM | POA: Diagnosis present

## 2022-06-05 DIAGNOSIS — Z23 Encounter for immunization: Secondary | ICD-10-CM | POA: Diagnosis not present

## 2022-06-05 DIAGNOSIS — Z79899 Other long term (current) drug therapy: Secondary | ICD-10-CM

## 2022-06-05 DIAGNOSIS — R55 Syncope and collapse: Secondary | ICD-10-CM

## 2022-06-05 LAB — POCT URINALYSIS DIP (MANUAL ENTRY)
Bilirubin, UA: NEGATIVE
Blood, UA: NEGATIVE
Glucose, UA: NEGATIVE mg/dL
Ketones, POC UA: NEGATIVE mg/dL
Leukocytes, UA: NEGATIVE
Nitrite, UA: NEGATIVE
Protein Ur, POC: NEGATIVE mg/dL
Spec Grav, UA: 1.015 (ref 1.010–1.025)
Urobilinogen, UA: 0.2 E.U./dL
pH, UA: 8 (ref 5.0–8.0)

## 2022-06-05 NOTE — Progress Notes (Signed)
Jonathan Rivas is alone Sources of clinical information for visit is/are patient and ED visit. Nursing assessment for this office visit was reviewed with the patient for accuracy and revision.     Previous Report(s) Reviewed: ER records 05/25/22 for syncope.     06/05/2022    9:22 AM  Depression screen PHQ 2/9  Decreased Interest 0  Down, Depressed, Hopeless 0  PHQ - 2 Score 0  Altered sleeping 0  Tired, decreased energy 0  Change in appetite 0  Feeling bad or failure about yourself  0  Trouble concentrating 0  Moving slowly or fidgety/restless 0  Suicidal thoughts 0  PHQ-9 Score 0   Flowsheet Row Office Visit from 06/05/2022 in [facility] Office Visit from 03/12/2022 in [facility] Office Visit from 03/07/2021 in [facility] [facility]  Thoughts that you would be better off dead, or of hurting yourself in some way Not at all Not at all Not at all  PHQ-9 Total Score 0 0 0          09/01/2019    2:04 PM 02/08/2016    1:35 PM  [place] in the past year? 0 No       06/05/2022    9:22 AM 03/12/2022    8:33 AM 03/07/2021    8:26 AM  PHQ9 SCORE ONLY  PHQ-9 Total Score 0 0 0    Adult vaccines due  Topic Date Due   TETANUS/TDAP  09/25/2026    Health Maintenance Due  Topic Date Due   Zoster Vaccines- Shingrix (1 of 2) Never done      History/P.E. limitations: none  Adult vaccines due  Topic Date Due   TETANUS/TDAP  09/25/2026   There are no preventive care reminders to display for this patient.  Health Maintenance Due  Topic Date Due   Zoster Vaccines- Shingrix (1 of 2) Never done     Chief Complaint  Patient presents with   Follow-up    LOV

## 2022-06-05 NOTE — Patient Instructions (Signed)
Once all the test results are back, Dr Dejae Bernet will call you to discuss starting Truvada.   Please read about PReP therapy at [email or web address].

## 2022-06-06 ENCOUNTER — Other Ambulatory Visit (HOSPITAL_COMMUNITY): Payer: Self-pay

## 2022-06-06 DIAGNOSIS — R55 Syncope and collapse: Secondary | ICD-10-CM | POA: Insufficient documentation

## 2022-06-06 HISTORY — DX: Syncope and collapse: R55

## 2022-06-06 LAB — CERVICOVAGINAL ANCILLARY ONLY
Chlamydia: NEGATIVE
Chlamydia: NEGATIVE
Comment: NEGATIVE
Comment: NEGATIVE
Comment: NORMAL
Comment: NORMAL
Neisseria Gonorrhea: NEGATIVE
Neisseria Gonorrhea: NEGATIVE

## 2022-06-06 LAB — HEPATIC FUNCTION PANEL
ALT: 19 IU/L (ref 0–44)
AST: 22 IU/L (ref 0–40)
Albumin: 4.6 g/dL (ref 3.8–4.9)
Alkaline Phosphatase: 74 IU/L (ref 44–121)
Bilirubin Total: 0.4 mg/dL (ref 0.0–1.2)
Bilirubin, Direct: 0.11 mg/dL (ref 0.00–0.40)
Total Protein: 6.9 g/dL (ref 6.0–8.5)

## 2022-06-06 LAB — URINE CYTOLOGY ANCILLARY ONLY
Chlamydia: NEGATIVE
Comment: NEGATIVE
Comment: NORMAL
Neisseria Gonorrhea: NEGATIVE

## 2022-06-06 LAB — HEPATITIS B CORE ANTIBODY, IGM: Hep B C IgM: NEGATIVE

## 2022-06-06 LAB — HEPATITIS B SURFACE ANTIGEN: Hepatitis B Surface Ag: NEGATIVE

## 2022-06-06 LAB — HEPATITIS C ANTIBODY: Hep C Virus Ab: NONREACTIVE

## 2022-06-06 LAB — HIV ANTIBODY (ROUTINE TESTING W REFLEX): HIV Screen 4th Generation wRfx: NONREACTIVE

## 2022-06-06 LAB — HEPATITIS B SURFACE ANTIBODY,QUALITATIVE: Hep B Surface Ab, Qual: NONREACTIVE

## 2022-06-06 LAB — HAV ANTIBODY W/ RFX: hep A Total Ab: NEGATIVE

## 2022-06-06 LAB — RPR: RPR Ser Ql: NONREACTIVE

## 2022-06-06 NOTE — Assessment & Plan Note (Signed)
Follow up from ED visit 05/25/22 for syncope. Event was attributed to hypovolemic orthostatic hypotension. Patient was thought to have been dehydrated. No recurrence  Ropinirole can cause hypotension.  There was drop in SBP and rise in pulse on orthhostatic vitals today, but did not reach threshold and was not associated with dizziness  If recurs, would discuss possible contributing role from ropinirole

## 2022-06-06 NOTE — Assessment & Plan Note (Signed)
Mr Jonathan Rivas is interested in PrEP therapy. He is in a mutually monogamous homosexual relationship. His partner is taking PrEP therapy.  Pre-prescription Patient Education Checklist was reviewed with Mr Jonathan Rivas during this visit.   Baseline labs and cultures were collected.   Given Mr Jonathan Rivas's normal eGFR, Truvada would be the preferred regimen using the daily dose method.   Once prescribed, contact him in 2 weeks to ensure he has obtained Prescription and understands how to use it, address payment problems, assess for ADE.   Follow up visits 1 to 2 times a year to address PrEP therapy.  Testing every 3 months (nursing and lab visit)

## 2022-06-07 ENCOUNTER — Other Ambulatory Visit (HOSPITAL_COMMUNITY): Payer: Self-pay

## 2022-06-11 ENCOUNTER — Other Ambulatory Visit: Payer: Self-pay | Admitting: Family Medicine

## 2022-06-11 ENCOUNTER — Other Ambulatory Visit (HOSPITAL_COMMUNITY): Payer: Self-pay

## 2022-06-11 ENCOUNTER — Telehealth: Payer: Self-pay | Admitting: Family Medicine

## 2022-06-11 DIAGNOSIS — F3342 Major depressive disorder, recurrent, in full remission: Secondary | ICD-10-CM

## 2022-06-11 DIAGNOSIS — Z7252 High risk homosexual behavior: Secondary | ICD-10-CM

## 2022-06-11 DIAGNOSIS — M2011 Hallux valgus (acquired), right foot: Secondary | ICD-10-CM

## 2022-06-11 DIAGNOSIS — M79671 Pain in right foot: Secondary | ICD-10-CM

## 2022-06-11 MED ORDER — EMTRICITABINE-TENOFOVIR DF 200-300 MG PO TABS
1.0000 | ORAL_TABLET | Freq: Every day | ORAL | 0 refills | Status: DC
  Filled 2022-06-11 – 2022-06-19 (×2): qty 90, 90d supply, fill #0
  Filled 2022-06-27: qty 30, 30d supply, fill #0
  Filled 2022-07-18: qty 30, 30d supply, fill #1
  Filled 2022-08-20: qty 30, 30d supply, fill #2

## 2022-06-11 NOTE — Telephone Encounter (Signed)
I discussed pre-PrEP testing results as reassuring. Prescription Truvad 200/'300mg'$  by mouth daily sent Referral to [facility] sent for bilateral painful bunions sent.

## 2022-06-19 ENCOUNTER — Other Ambulatory Visit (HOSPITAL_COMMUNITY): Payer: Self-pay

## 2022-06-23 ENCOUNTER — Ambulatory Visit: Payer: No Typology Code available for payment source | Admitting: Podiatry

## 2022-06-23 ENCOUNTER — Ambulatory Visit (INDEPENDENT_AMBULATORY_CARE_PROVIDER_SITE_OTHER): Payer: No Typology Code available for payment source

## 2022-06-23 DIAGNOSIS — M2011 Hallux valgus (acquired), right foot: Secondary | ICD-10-CM

## 2022-06-23 DIAGNOSIS — M779 Enthesopathy, unspecified: Secondary | ICD-10-CM

## 2022-06-23 DIAGNOSIS — E559 Vitamin D deficiency, unspecified: Secondary | ICD-10-CM

## 2022-06-23 DIAGNOSIS — M21611 Bunion of right foot: Secondary | ICD-10-CM

## 2022-06-23 NOTE — Progress Notes (Signed)
  Subjective:  Patient ID: Jonathan Rivas, male    DOB: Sep 26, 1971,  MRN: [id number]  Chief Complaint  Patient presents with   Bunions    Patient has a bunion to great toe on right foot. Has been having pain that started about 2 years ago.     51 y.o. male presents with the above complaint. History confirmed with patient.  Been bothering him and getting worse has tried everything he can think of as far as wider shoe gears and offloading pads has not helped.  He primarily works from home.  He had bariatric surgery and lost quite a bit of weight and is trying to be active.  Objective:  Physical Exam: warm, good capillary refill, no trophic changes or ulcerative lesions, normal DP and PT pulses, normal sensory exam, and painful hallux valgus right foot, pain over the medial eminence, good smooth range of motion of the MTPJ, some hypermobility in the first ray noted at the TMT, he has some tenderness in the TMT as well.   Radiographs: Multiple views x-ray of the right foot: Mild degenerative changes in the first MTPJ, he has moderate hallux valgus with abduction of the hallux and increase in the intermetatarsal angle Assessment:   1. Capsulitis   2. Vitamin D deficiency   3. Hallux valgus with bunions, right      Plan:  Patient was evaluated and treated and all questions answered.  Discussed the etiology and treatment including surgical and non surgical treatment for painful bunions.  He has exhausted all non surgical treatment prior to this visit including shoe gear changes and padding.  He desires surgical intervention. We discussed all risks including but not limited to: pain, swelling, infection, scar, numbness which may be temporary or permanent, chronic pain, stiffness, nerve pain or damage, wound healing problems, bone healing problems including delayed or non-union and recurrence. Specifically we discussed the following procedures: Lapidus bunionectomy of the right foot with  possible Akin osteotomy, bone graft from heel.. Informed consent was signed today. Surgery will be scheduled at a mutually agreeable date. Information regarding this will be forwarded to our surgery scheduler.  Has not had vitamin D checked.  Order was placed for this to evaluate for bone healing capacity prior to surgery.   Surgical plan:  Procedure: -Right foot Lapidus bunionectomy, possible Akin osteotomy, bone graft from heel  Location: -[facility]  Anesthesia plan: -IV sedation with a local block  Postoperative pain plan: - Tylenol 1000 mg every 6 hours, ibuprofen 600 mg every 6 hours, gabapentin 300 mg every 8 hours x5 days, oxycodone 5 mg 1-2 tabs every 6 hours only as needed  DVT prophylaxis: -None required  WB Restrictions / DME needs: -NWB postop with CAM boot  No follow-ups on file.

## 2022-06-27 ENCOUNTER — Encounter (HOSPITAL_COMMUNITY): Payer: Self-pay | Admitting: *Deleted

## 2022-06-27 ENCOUNTER — Other Ambulatory Visit (HOSPITAL_COMMUNITY): Payer: Self-pay

## 2022-06-30 ENCOUNTER — Other Ambulatory Visit (HOSPITAL_COMMUNITY): Payer: Self-pay

## 2022-07-09 ENCOUNTER — Encounter: Payer: Self-pay | Admitting: Family Medicine

## 2022-07-09 DIAGNOSIS — F3342 Major depressive disorder, recurrent, in full remission: Secondary | ICD-10-CM

## 2022-07-10 ENCOUNTER — Encounter: Payer: Self-pay | Admitting: Family Medicine

## 2022-07-12 LAB — VITAMIN D 25 HYDROXY (VIT D DEFICIENCY, FRACTURES): Vit D, 25-Hydroxy: 41.6 ng/mL (ref 30.0–100.0)

## 2022-07-18 ENCOUNTER — Other Ambulatory Visit (HOSPITAL_COMMUNITY): Payer: Self-pay

## 2022-07-18 ENCOUNTER — Telehealth: Payer: Self-pay | Admitting: Urology

## 2022-07-18 NOTE — Telephone Encounter (Signed)
DOS - 08/15/22  LAPIDUS PROC. INCLUDING BUNIONECTOMY RIGHT --- [name] OSTEOTOMY RIGHT --- [phone] BONE BIOPSY RIGHT --- [address]  [facility] EFFECTIVE DATE - 11/24/21  SPOKE WITH MARY T. WITH [facility] AND SHE STATED CPT CODES 07371, [phone] AND [id number] HAS BEEN APPROVED, AUTH # 1 - [id number], GOOD FROM 08/15/22 - 09/14/22.  REF # MARY T. 07/18/22 AT 10:17 AM EST

## 2022-07-22 ENCOUNTER — Other Ambulatory Visit (HOSPITAL_COMMUNITY): Payer: Self-pay

## 2022-07-22 MED ORDER — VENLAFAXINE HCL ER 37.5 MG PO CP24
37.5000 mg | ORAL_CAPSULE | Freq: Every day | ORAL | 1 refills | Status: DC
  Filled 2022-07-22: qty 30, 30d supply, fill #0

## 2022-07-24 ENCOUNTER — Other Ambulatory Visit (HOSPITAL_COMMUNITY): Payer: Self-pay

## 2022-08-15 ENCOUNTER — Other Ambulatory Visit: Payer: Self-pay | Admitting: Podiatry

## 2022-08-15 ENCOUNTER — Other Ambulatory Visit (HOSPITAL_COMMUNITY): Payer: Self-pay

## 2022-08-15 DIAGNOSIS — M89771 Major osseous defect, right ankle and foot: Secondary | ICD-10-CM | POA: Diagnosis not present

## 2022-08-15 DIAGNOSIS — M2041 Other hammer toe(s) (acquired), right foot: Secondary | ICD-10-CM | POA: Diagnosis not present

## 2022-08-15 DIAGNOSIS — M2011 Hallux valgus (acquired), right foot: Secondary | ICD-10-CM | POA: Diagnosis not present

## 2022-08-15 DIAGNOSIS — M21611 Bunion of right foot: Secondary | ICD-10-CM

## 2022-08-15 MED ORDER — IBUPROFEN 600 MG PO TABS
600.0000 mg | ORAL_TABLET | Freq: Four times a day (QID) | ORAL | 0 refills | Status: AC | PRN
  Filled 2022-08-15: qty 56, 14d supply, fill #0

## 2022-08-15 MED ORDER — GABAPENTIN 300 MG PO CAPS
300.0000 mg | ORAL_CAPSULE | Freq: Three times a day (TID) | ORAL | 0 refills | Status: DC
  Filled 2022-08-15: qty 21, 7d supply, fill #0

## 2022-08-15 MED ORDER — ACETAMINOPHEN 500 MG PO TABS
1000.0000 mg | ORAL_TABLET | Freq: Four times a day (QID) | ORAL | 0 refills | Status: AC | PRN
  Filled 2022-08-15: qty 112, 14d supply, fill #0

## 2022-08-15 MED ORDER — OXYCODONE HCL 5 MG PO TABS
5.0000 mg | ORAL_TABLET | ORAL | 0 refills | Status: AC | PRN
  Filled 2022-08-15: qty 20, 4d supply, fill #0

## 2022-08-15 NOTE — Progress Notes (Signed)
08/15/22 lapidus bunionectomy right foot

## 2022-08-18 ENCOUNTER — Other Ambulatory Visit (HOSPITAL_COMMUNITY): Payer: Self-pay

## 2022-08-20 ENCOUNTER — Other Ambulatory Visit (HOSPITAL_COMMUNITY): Payer: Self-pay

## 2022-08-21 ENCOUNTER — Ambulatory Visit (INDEPENDENT_AMBULATORY_CARE_PROVIDER_SITE_OTHER): Payer: No Typology Code available for payment source | Admitting: Podiatry

## 2022-08-21 ENCOUNTER — Ambulatory Visit (INDEPENDENT_AMBULATORY_CARE_PROVIDER_SITE_OTHER): Payer: No Typology Code available for payment source

## 2022-08-21 DIAGNOSIS — M2011 Hallux valgus (acquired), right foot: Secondary | ICD-10-CM

## 2022-08-21 DIAGNOSIS — M21611 Bunion of right foot: Secondary | ICD-10-CM

## 2022-08-22 ENCOUNTER — Other Ambulatory Visit (HOSPITAL_COMMUNITY): Payer: Self-pay

## 2022-08-23 NOTE — Progress Notes (Signed)
  Subjective:  Patient ID: Jonathan Rivas, male    DOB: 31-May-1971,  MRN: [id number]  Chief Complaint  Patient presents with   Routine Post Op    POV #1 DOS 08/15/2022 RT FOOT BUNION CORRECTION (LAPIDUS) POSS BONE CUT IN BIG TOE [name]), BONE GRAFT FROM HEEL     51 y.o. male returns for post-op check.  Overall doing well his pain is well controlled he has not had any issues with nausea or vomiting  Review of Systems: Negative except as noted in the HPI. Denies N/V/F/Ch.   Objective:  There were no vitals filed for this visit. There is no height or weight on file to calculate BMI. Constitutional Well developed. Well nourished.  Vascular Foot warm and well perfused. Capillary refill normal to all digits.  Calf is soft and supple, no posterior calf or knee pain, negative Homans' sign  Neurologic Normal speech. Oriented to person, place, and time. Epicritic sensation to light touch grossly present bilaterally.  Dermatologic Skin healing well without signs of infection. Skin edges well coapted without signs of infection.  Orthopedic: Tenderness to palpation noted about the surgical site.  Moderate edema   Multiple view plain film radiographs: Good correction noted, there is some elevatus present however bone graft spans the plantar portion of the joint all hardware is intact and equivalent to immediate postoperative films Assessment:   1. Hallux valgus with bunions, right    Plan:  Patient was evaluated and treated and all questions answered.  S/p foot surgery right -Progressing as expected post-operatively. -XR: Noted above.  Some plantar gapping at the arthrodesis site however there is bone graft present here and there is no complication of hardware -WB Status: NWB in cam walker boot -Sutures: Plan remove in 2 weeks. -Medications: No refills required -Foot redressed.  He may begin bathing Monday.  The following Monday he may begin walking in the boot, focus weight on  heel  Return in about 11 days (around 09/01/2022) for post op (no x-rays), suture removal.

## 2022-08-25 ENCOUNTER — Other Ambulatory Visit (HOSPITAL_COMMUNITY): Payer: Self-pay

## 2022-08-26 ENCOUNTER — Other Ambulatory Visit (HOSPITAL_COMMUNITY): Payer: Self-pay

## 2022-09-01 ENCOUNTER — Ambulatory Visit (INDEPENDENT_AMBULATORY_CARE_PROVIDER_SITE_OTHER): Payer: No Typology Code available for payment source | Admitting: Podiatry

## 2022-09-01 ENCOUNTER — Other Ambulatory Visit (HOSPITAL_COMMUNITY): Payer: Self-pay

## 2022-09-01 DIAGNOSIS — M21611 Bunion of right foot: Secondary | ICD-10-CM

## 2022-09-01 DIAGNOSIS — M2011 Hallux valgus (acquired), right foot: Secondary | ICD-10-CM

## 2022-09-01 MED ORDER — MUPIROCIN 2 % EX OINT
1.0000 | TOPICAL_OINTMENT | Freq: Every day | CUTANEOUS | 0 refills | Status: DC
  Filled 2022-09-01: qty 22, 30d supply, fill #0

## 2022-09-04 ENCOUNTER — Encounter: Payer: No Typology Code available for payment source | Admitting: Podiatry

## 2022-09-04 NOTE — Progress Notes (Signed)
  Subjective:  Patient ID: Jonathan Rivas, male    DOB: [birth year],  MRN: [id number]  Chief Complaint  Patient presents with   Routine Post Op     suture removal, POV #2 DOS 08/15/2022 RT FOOT BUNION CORRECTION (LAPIDUS) POSS BONE CUT IN BIG TOE [name]), BONE GRAFT FROM HEEL      51 y.o. male returns for post-op check.  Overall doing well   Review of Systems: Negative except as noted in the HPI. Denies N/V/F/Ch.   Objective:  There were no vitals filed for this visit. There is no height or weight on file to calculate BMI. Constitutional Well developed. Well nourished.  Vascular Foot warm and well perfused. Capillary refill normal to all digits.  Calf is soft and supple, no posterior calf or knee pain, negative Homans' sign  Neurologic Normal speech. Oriented to person, place, and time. Epicritic sensation to light touch grossly present bilaterally.  Dermatologic Skin healing well without signs of infection. Skin edges well coapted without signs of infection.  Orthopedic: Tenderness to palpation noted about the surgical site.  Moderate edema   Multiple view plain film radiographs: Good correction noted, there is some elevatus present however bone graft spans the plantar portion of the joint all hardware is intact and equivalent to immediate postoperative films Assessment:   1. Hallux valgus with bunions, right     Plan:  Patient was evaluated and treated and all questions answered.  S/p foot surgery right -All sutures removed today.  He may begin to [facility] in the boot with weight focused on the heel with use of crutches for assistance.  Return in 4 weeks for new x-rays  Return in about 4 weeks (around 09/29/2022) for post op (new x-rays).

## 2022-09-17 ENCOUNTER — Other Ambulatory Visit (HOSPITAL_COMMUNITY): Payer: Self-pay

## 2022-09-17 ENCOUNTER — Other Ambulatory Visit: Payer: Self-pay | Admitting: Family Medicine

## 2022-09-17 DIAGNOSIS — Z7252 High risk homosexual behavior: Secondary | ICD-10-CM

## 2022-09-18 ENCOUNTER — Encounter: Payer: Self-pay | Admitting: Podiatry

## 2022-09-22 ENCOUNTER — Emergency Department (HOSPITAL_COMMUNITY): Payer: No Typology Code available for payment source

## 2022-09-22 ENCOUNTER — Encounter (HOSPITAL_COMMUNITY): Payer: Self-pay

## 2022-09-22 ENCOUNTER — Other Ambulatory Visit: Payer: Self-pay

## 2022-09-22 ENCOUNTER — Other Ambulatory Visit (HOSPITAL_COMMUNITY): Payer: Self-pay

## 2022-09-22 ENCOUNTER — Observation Stay (HOSPITAL_COMMUNITY)
Admission: EM | Admit: 2022-09-22 | Discharge: 2022-09-23 | Disposition: A | Discharge status: Home / Self Care | Payer: No Typology Code available for payment source | Attending: Internal Medicine | Admitting: Internal Medicine

## 2022-09-22 DIAGNOSIS — K8511 Biliary acute pancreatitis with uninfected necrosis: Secondary | ICD-10-CM | POA: Diagnosis not present

## 2022-09-22 DIAGNOSIS — G2581 Restless legs syndrome: Secondary | ICD-10-CM | POA: Diagnosis present

## 2022-09-22 DIAGNOSIS — R1010 Upper abdominal pain, unspecified: Secondary | ICD-10-CM

## 2022-09-22 DIAGNOSIS — E875 Hyperkalemia: Secondary | ICD-10-CM | POA: Diagnosis not present

## 2022-09-22 DIAGNOSIS — R1011 Right upper quadrant pain: Secondary | ICD-10-CM | POA: Diagnosis present

## 2022-09-22 DIAGNOSIS — K8012 Calculus of gallbladder with acute and chronic cholecystitis without obstruction: Principal | ICD-10-CM | POA: Insufficient documentation

## 2022-09-22 DIAGNOSIS — K819 Cholecystitis, unspecified: Principal | ICD-10-CM

## 2022-09-22 DIAGNOSIS — K810 Acute cholecystitis: Secondary | ICD-10-CM

## 2022-09-22 DIAGNOSIS — F325 Major depressive disorder, single episode, in full remission: Secondary | ICD-10-CM | POA: Diagnosis present

## 2022-09-22 DIAGNOSIS — Z9884 Bariatric surgery status: Secondary | ICD-10-CM

## 2022-09-22 DIAGNOSIS — Z87891 Personal history of nicotine dependence: Secondary | ICD-10-CM | POA: Diagnosis not present

## 2022-09-22 DIAGNOSIS — K851 Biliary acute pancreatitis without necrosis or infection: Secondary | ICD-10-CM

## 2022-09-22 DIAGNOSIS — F339 Major depressive disorder, recurrent, unspecified: Secondary | ICD-10-CM | POA: Diagnosis present

## 2022-09-22 DIAGNOSIS — R109 Unspecified abdominal pain: Secondary | ICD-10-CM

## 2022-09-22 LAB — CBC WITH DIFFERENTIAL/PLATELET
Abs Immature Granulocytes: 0.04 10*3/uL (ref 0.00–0.07)
Basophils Absolute: 0.1 10*3/uL (ref 0.0–0.1)
Basophils Relative: 0 %
Eosinophils Absolute: 0 10*3/uL (ref 0.0–0.5)
Eosinophils Relative: 0 %
HCT: 42.7 % (ref 39.0–52.0)
Hemoglobin: 13.6 g/dL (ref 13.0–17.0)
Immature Granulocytes: 0 %
Lymphocytes Relative: 15 %
Lymphs Abs: 2 10*3/uL (ref 0.7–4.0)
MCH: 30.1 pg (ref 26.0–34.0)
MCHC: 31.9 g/dL (ref 30.0–36.0)
MCV: 94.5 fL (ref 80.0–100.0)
Monocytes Absolute: 0.6 10*3/uL (ref 0.1–1.0)
Monocytes Relative: 4 %
Neutro Abs: 11 10*3/uL — ABNORMAL HIGH (ref 1.7–7.7)
Neutrophils Relative %: 81 %
Platelets: 252 10*3/uL (ref 150–400)
RBC: 4.52 MIL/uL (ref 4.22–5.81)
RDW: 13.8 % (ref 11.5–15.5)
WBC: 13.7 10*3/uL — ABNORMAL HIGH (ref 4.0–10.5)
nRBC: 0 % (ref 0.0–0.2)

## 2022-09-22 LAB — COMPREHENSIVE METABOLIC PANEL
ALT: 127 U/L — ABNORMAL HIGH (ref 0–44)
AST: 152 U/L — ABNORMAL HIGH (ref 15–41)
Albumin: 4.1 g/dL (ref 3.5–5.0)
Alkaline Phosphatase: 72 U/L (ref 38–126)
Anion gap: 5 (ref 5–15)
BUN: 10 mg/dL (ref 6–20)
CO2: 27 mmol/L (ref 22–32)
Calcium: 9.1 mg/dL (ref 8.9–10.3)
Chloride: 104 mmol/L (ref 98–111)
Creatinine, Ser: 0.75 mg/dL (ref 0.61–1.24)
GFR, Estimated: >60 mL/min (ref >60)
Glucose, Bld: 103 mg/dL — ABNORMAL HIGH (ref 70–99)
Potassium: 5.2 mmol/L — ABNORMAL HIGH (ref 3.5–5.1)
Sodium: 136 mmol/L (ref 135–145)
Total Bilirubin: 2.5 mg/dL — ABNORMAL HIGH (ref 0.3–1.2)
Total Protein: 7.4 g/dL (ref 6.5–8.1)

## 2022-09-22 LAB — URINALYSIS, ROUTINE W REFLEX MICROSCOPIC
Bacteria, UA: NONE SEEN
Bilirubin Urine: NEGATIVE
Glucose, UA: NEGATIVE mg/dL
Ketones, ur: NEGATIVE mg/dL
Leukocytes,Ua: NEGATIVE
Nitrite: NEGATIVE
Protein, ur: NEGATIVE mg/dL
Specific Gravity, Urine: 1.009 (ref 1.005–1.030)
pH: 7 (ref 5.0–8.0)

## 2022-09-22 LAB — LIPASE, BLOOD: Lipase: 210 U/L — ABNORMAL HIGH (ref 11–51)

## 2022-09-22 MED ORDER — EMTRICITABINE-TENOFOVIR DF 200-300 MG PO TABS
1.0000 | ORAL_TABLET | Freq: Every day | ORAL | 0 refills | Status: DC
  Filled 2022-09-22: qty 30, 30d supply, fill #0

## 2022-09-22 MED ORDER — MELATONIN 5 MG PO TABS: 5.0000 mg | ORAL_TABLET | Freq: Every evening | ORAL | Status: DC | PRN

## 2022-09-22 MED ORDER — ADULT MULTIVITAMIN W/MINERALS CH: 1.0000 | ORAL_TABLET | Freq: Every day | ORAL | Status: DC

## 2022-09-22 MED ORDER — SODIUM CHLORIDE 0.9 % IV SOLN: INTRAVENOUS | Status: DC

## 2022-09-22 MED ORDER — FENTANYL CITRATE PF 50 MCG/ML IJ SOSY
50.0000 ug | PREFILLED_SYRINGE | Freq: Once | INTRAMUSCULAR | Status: AC
  Administered 2022-09-22: 50 ug via INTRAVENOUS
  Filled 2022-09-22: qty 1

## 2022-09-22 MED ORDER — PROCHLORPERAZINE EDISYLATE 10 MG/2ML IJ SOLN: 5.0000 mg | Freq: Four times a day (QID) | INTRAMUSCULAR | Status: DC | PRN

## 2022-09-22 MED ORDER — SODIUM CHLORIDE (PF) 0.9 % IJ SOLN
INTRAMUSCULAR | Status: AC
  Filled 2022-09-22: qty 50

## 2022-09-22 MED ORDER — ENOXAPARIN SODIUM 40 MG/0.4ML IJ SOSY
40.0000 mg | PREFILLED_SYRINGE | INTRAMUSCULAR | Status: DC
  Administered 2022-09-22: 40 mg via SUBCUTANEOUS
  Filled 2022-09-22: qty 0.4

## 2022-09-22 MED ORDER — VENLAFAXINE HCL ER 37.5 MG PO CP24
37.5000 mg | ORAL_CAPSULE | Freq: Every day | ORAL | Status: DC
  Filled 2022-09-22: qty 1

## 2022-09-22 MED ORDER — ONDANSETRON HCL 4 MG/2ML IJ SOLN
4.0000 mg | Freq: Once | INTRAMUSCULAR | Status: AC
  Administered 2022-09-22: 4 mg via INTRAVENOUS
  Filled 2022-09-22: qty 2

## 2022-09-22 MED ORDER — OXYCODONE HCL 5 MG PO TABS: 5.0000 mg | ORAL_TABLET | Freq: Four times a day (QID) | ORAL | Status: DC | PRN

## 2022-09-22 MED ORDER — IOHEXOL 300 MG/ML  SOLN
100.0000 mL | Freq: Once | INTRAMUSCULAR | Status: AC | PRN
  Administered 2022-09-22: 100 mL via INTRAVENOUS

## 2022-09-22 MED ORDER — HYDROMORPHONE HCL 1 MG/ML IJ SOLN
0.5000 mg | INTRAMUSCULAR | Status: DC | PRN
  Administered 2022-09-23: 0.5 mg via INTRAVENOUS
  Filled 2022-09-22: qty 0.5

## 2022-09-22 MED ORDER — ROPINIROLE HCL 1 MG PO TABS
2.0000 mg | ORAL_TABLET | Freq: Every day | ORAL | Status: DC
  Administered 2022-09-22: 2 mg via ORAL
  Filled 2022-09-22: qty 2

## 2022-09-22 MED ORDER — EMTRICITABINE-TENOFOVIR AF 200-25 MG PO TABS
1.0000 | ORAL_TABLET | Freq: Every day | ORAL | Status: DC
  Filled 2022-09-22 (×2): qty 1

## 2022-09-22 MED ORDER — LORATADINE 10 MG PO TABS
10.0000 mg | ORAL_TABLET | Freq: Every day | ORAL | Status: DC
  Administered 2022-09-22: 10 mg via ORAL
  Filled 2022-09-22: qty 1

## 2022-09-22 MED ORDER — OYSTER SHELL CALCIUM/D3 500-5 MG-MCG PO TABS: 2.0000 | ORAL_TABLET | Freq: Every day | ORAL | Status: DC

## 2022-09-22 MED ORDER — POLYETHYLENE GLYCOL 3350 17 G PO PACK: 17.0000 g | PACK | Freq: Every day | ORAL | Status: DC | PRN

## 2022-09-22 NOTE — H&P (Signed)
History and Physical  Jonathan Rivas XLK:[id number] DOB: 05-19-1971 DOA: 09/22/2022  Referring physician: Dr. Alvino Chapel, [place]  PCP: McDiarmid, Blane Ohara, MD  Outpatient Specialists: Podiatry Patient coming from: Home.  Chief Complaint: Upper abdominal pain.  HPI: Jonathan Rivas is a 51 y.o. male with medical history significant for MSM status on HIV pre-exposure prophylaxis, chronic anxiety/depression, restless leg syndrome, status post laparoscopic sleeve gastrectomy, status post right foot bunionectomy 08/15/2022, who presented to [facility] ED from home with complaints of sudden onset upper abdominal pain radiating to his back.  Onset this morning after drinking a cup of coffee.  Associated with 2 episodes of nausea and vomiting this morning.  He was in his usual state of health prior to this.  No reported subjective fevers or chills.  No history of heavy alcohol use.  He presented to the ED for further evaluation.  In the ED, work-up revealed elevated lipase level with upper abdominal pain that radiated to his back with concern for acute pancreatitis.  Also revealed, distended gallbladder with mild surrounding soft tissue stranding concerning for acute cholecystitis, evidence of intrahepatic biliary ductal dilatation with a normal size common bile duct and pancreatic duct.  This may be due to stone in the proximal common bile duct.  EDP discussed the case with general surgery who will see in consultation.  General surgery recommended GI also be involved.  The patient was admitted by [facility], hospitalist service.  ED Course: Tmax 99.4.  BP 133/115, pulse 69, respiration rate 18, O2 saturation 97% on room air.  Lab studies remarkable for serum potassium 5.2, glucose 103, lipase 210, AST 152, ALT 127, T. bili Ruben 2.5.  WBC 13.7, neutrophil count 11.0.  UA negative for pyuria.  Review of Systems: Review of systems as noted in the HPI. All other systems reviewed and are negative.   Past Medical History:   Diagnosis Date   Allergic rhinitis    Allergy    At risk for diabetes mellitus 10/28/2018   Blood transfusion without reported diagnosis    with bariatric surgery   Depression    Erectile dysfunction    Erectile dysfunction 04/10/2015   Hyperlipidemia 06/22/2019   Insomnia 12/28/2017   Morbid obesity ([place]) 04/10/2015   Numbness and tingling in both hands 09/25/2016   Obesity, morbid, BMI 40.0-49.9 ([place])    Palpitations 09/25/2016   Pure hypercholesterolemia 06/22/2019   ASCVD 10-yr risk ~ 3% (2021)   Tobacco use    Tubular adenoma of colon 06/14/2021   Tubular adenoma colon x 1 (< 1 cm)   Urinary incontinence, post-void dribbling 10/29/2018   Past Surgical History:  Procedure Laterality Date   CARDIAC CATHETERIZATION N/A 10/03/2016   No CAD: Procedure: Left Heart Cath and Coronary Angiography;  Surgeon: Troy Sine, MD;  Location: [facility];  Service: Cardiovascular;  Laterality: N/A;   COLONOSCOPY  06/07/2021   CYSTOSCOPY     Hematuria workup.  No recalled significant findings   LAPAROSCOPIC GASTRIC SLEEVE RESECTION N/A 11/28/2019   Procedure: LAPAROSCOPIC GASTRIC SLEEVE RESECTION, UPPER Endo, Eras Pathway;  Surgeon: Johnathan Hausen, MD;  Location: [facility];  Service: General;  Laterality: N/A;   wisdom teeth      Social History:  reports that he quit smoking about 4 years ago. His smoking use included cigarettes. He has a 6.25 pack-year smoking history. He has never used smokeless tobacco. He reports that he does not currently use alcohol after a past usage of about 2.0 standard drinks  of alcohol per week. He reports that he does not use drugs.   No Known Allergies  Family History  Problem Relation Age of Onset   Cancer Mother        Leukemia   Heart disease Father        Age 3's   Depression Brother    Alcohol abuse Brother    Drug abuse Brother    Asthma Maternal Aunt    Alcohol abuse Maternal Aunt    Heart disease Paternal Uncle    Heart disease  Paternal Grandfather    Colon cancer Neg Hx    Colon polyps Neg Hx    Esophageal cancer Neg Hx    Stomach cancer Neg Hx    Rectal cancer Neg Hx       Prior to Admission medications   Medication Sig Start Date End Date Taking? Authorizing Provider  calcium-vitamin D 250-100 MG-UNIT tablet Take 2 tablets by mouth in the morning, at noon, and at bedtime. 03/07/21   McDiarmid, Blane Ohara, MD  emtricitabine-tenofovir (TRUVADA) 200-300 MG tablet Take 1 tablet by mouth daily. 09/22/22   McDiarmid, Blane Ohara, MD  gabapentin (NEURONTIN) 300 MG capsule Take 1 capsule (300 mg total) by mouth 3 (three) times daily for 7 days. 08/15/22 08/22/22  McDonald, Stephan Minister, DPM  loratadine (CLARITIN) 10 MG tablet Take 10 mg by mouth at bedtime.    [provider]  Multiple Vitamins-Minerals (BARIATRIC MULTIVITAMINS/IRON) CAPS Take 1 capsule by mouth daily. 03/07/21   McDiarmid, Blane Ohara, MD  mupirocin ointment (BACTROBAN) 2 % Apply topically daily as directed. 09/01/22   McDonald, Stephan Minister, DPM  rOPINIRole (REQUIP) 2 MG tablet TAKE 1 TABLET BY MOUTH AT BEDTIME. 03/12/22 03/07/23  McDiarmid, Blane Ohara, MD  venlafaxine XR (EFFEXOR XR) 37.5 MG 24 hr capsule Take 1 capsule (37.5 mg total) by mouth daily with breakfast. 07/22/22 09/20/22  McDiarmid, Blane Ohara, MD    Physical Exam: BP 130/80   Pulse (!) 57   Temp 98.3 F (36.8 C) (Oral)   Resp 18   SpO2 99%   General: 51 y.o. year-old male well developed well nourished in no acute distress.  Alert and oriented x3. Cardiovascular: Regular rate and rhythm with no rubs or gallops.  No thyromegaly or JVD noted.  No lower extremity edema. 2/4 pulses in all 4 extremities. Respiratory: Clear to auscultation with no wheezes or rales. Good inspiratory effort. Abdomen: Soft, epigastric tenderness with palpation.  Nondistended with normal bowel sounds x4 quadrants. Muskuloskeletal: No cyanosis, clubbing or edema noted bilaterally Neuro: CN II-XII intact, strength, sensation,  reflexes Skin: No ulcerative lesions noted or rashes Psychiatry: Judgement and insight appear normal. Mood is appropriate for condition and setting          Labs on Admission:  Basic Metabolic Panel: Recent Labs  Lab 09/22/22 1152  NA 136  K 5.2*  CL 104  CO2 27  GLUCOSE 103*  BUN 10  CREATININE 0.75  CALCIUM 9.1   Liver Function Tests: Recent Labs  Lab 09/22/22 1152  AST 152*  ALT 127*  ALKPHOS 72  BILITOT 2.5*  PROT 7.4  ALBUMIN 4.1   Recent Labs  Lab 09/22/22 1152  LIPASE 210*   No results for input(s): "AMMONIA" in the last 168 hours. CBC: Recent Labs  Lab 09/22/22 1152  WBC 13.7*  NEUTROABS 11.0*  HGB 13.6  HCT 42.7  MCV 94.5  PLT 252   Cardiac Enzymes: No results for input(s): "CKTOTAL", "CKMB", "CKMBINDEX", "  TROPONINI" in the last 168 hours.  BNP (last 3 results) No results for input(s): "BNP" in the last 8760 hours.  ProBNP (last 3 results) No results for input(s): "PROBNP" in the last 8760 hours.  CBG: No results for input(s): "GLUCAP" in the last 168 hours.  Radiological Exams on Admission: CT ABDOMEN PELVIS W CONTRAST  Result Date: 09/22/2022 CLINICAL DATA:  Nausea vomiting EXAM: CT ABDOMEN AND PELVIS WITH CONTRAST TECHNIQUE: Multidetector CT imaging of the abdomen and pelvis was performed using the standard protocol following bolus administration of intravenous contrast. RADIATION DOSE REDUCTION: This exam was performed according to the departmental dose-optimization program which includes automated exposure control, adjustment of the mA and/or kV according to patient size and/or use of iterative reconstruction technique. CONTRAST:  120m OMNIPAQUE IOHEXOL 300 MG/ML  SOLN COMPARISON:  None FINDINGS: Lower chest: No acute abnormality. Hepatobiliary: There is likely mild hepatic steatosis. There are no focal hepatic lesions. There is evidence of intrahepatic biliary ductal dilatation. Common bile duct is normal in size. The gallbladder is  distended with mild surrounding soft tissue stranding. Pancreas: Unremarkable. No pancreatic ductal dilatation or surrounding inflammatory changes. Spleen: Normal in size without focal abnormality. Adrenals/Urinary Tract: Adrenal glands are unremarkable. Kidneys are normal, without renal calculi or hydronephrosis. There is a small hypodense lesion along the posterior margin of the left kidney, which most likely represents small renal cysts requiring no additional follow-up. Urinary bladder is partially decompressed with evidence of circumferential bladder wall thickening. Stomach/Bowel: Postsurgical changes from gastrectomy. The stomach, small bowel, large bowel are otherwise normal in caliber. Is no evidence of focal wall thickening. The appendix is normal in appearance. Vascular/Lymphatic: No significant vascular findings are present. No enlarged abdominal or pelvic lymph nodes. Reproductive: Prostate is unremarkable. Other: No abdominal wall hernia or abnormality. No abdominopelvic ascites. Musculoskeletal: No acute or significant osseous findings. IMPRESSION: 1. Distended gallbladder with mild surrounding soft tissue stranding. Findings are concerning for acute cholecystitis. Recommend right upper quadrant ultrasound for further evaluation. There is evidence of intrahepatic biliary ductal dilatation with a normal sized common bile duct and pancreatic duct. This may be due to a stone in the proximal common bile duct, mass effect from a stone in the cystic duct, or secondary to reactive narrowing in the setting of cholecystitis 2. Circumferential bladder wall thickening, which may be secondary to underdistention, cystitis or chronic bladder outlet obstruction. Correlate with urinalysis. Electronically Signed   By: HMarin RobertsM.D.   On: 09/22/2022 14:40    EKG: I independently viewed the EKG done and my findings are as followed: None available at the time of this visit.  Assessment/Plan Present on  Admission:  Abdominal pain  Principal Problem:   Abdominal pain  Abdominal pain, upper, secondary to acute pancreatitis Possibly gallstone pancreatitis Presented with upper abdominal pain radiating to his back, 2 episodes of nausea and vomiting, elevated lipase level 210. Started on IV fluid in the ED as well as IV opioids based analgesics. Continue supportive care, IV antiemetics as needed Obtain lipase level and chemistry panel in the morning. Right upper quadrant abdominal ultrasound revealed cholelithiasis with no sonographic evidence of acute cholecystitis.  Cholelithiasis General surgery consulted and will see in consultation. N.p.o. after midnight, until seen by general surgery, due to possible lap chole during this admission.  Hyperkalemia Serum potassium 5.2 No evidence of acute kidney injury. Continue IV fluid hydration Repeat chemistry panel.  Elevated liver chemistries in the setting of suspected choledocholithiasis Right upper quadrant abdominal ultrasound positive  for cholelithiasis Evidence of intrahepatic biliary ductal dilatation with a normal size common bile duct and pancreatic duct.  This may be due to stone in the proximal common bile duct, seen on CT scan.   MSM status on HIV pre-exposure prophylaxis. No acute issues Resume home HAART  Chronic anxiety/depression No acute issues Resume home venlafaxine  Restless leg syndrome Resume home ropinirole  Status post right foot bunionectomy Follows with podiatry outpatient     Critical care time: 65 minutes.      DVT prophylaxis: Subcu Lovenox daily  Code Status: Full code  Family Communication: Updated the patient's husband at bedside.  Disposition Plan: Admitted to progressive care unit  Consults called: General surgery was consulted by EDP.  Consult GI in the morning.  Admission status: Inpatient status.   Status is: Inpatient The patient requires at least 2 midnights for further  evaluation and treatment of present condition.   Kayleen Memos MD [facility] Pager [phone]  If 7PM-7AM, please contact night-coverage [email or web address] Password [email or web address]  09/22/2022, 8:05 PM

## 2022-09-22 NOTE — ED Provider Triage Note (Signed)
Emergency Medicine Provider Triage Evaluation Note  Jonathan Rivas , a 51 y.o. male  was evaluated in triage.  Pt complains of abdominal pain, nausea, vomiting.  Patient has a history of gastric sleeve.  Onset was yesterday.  No relief with OTC medication.  Review of Systems  Positive: Nausea, vomiting, abdominal pain Negative: Fever  Physical Exam  BP 124/76 (BP Location: Right Arm)   Pulse 68   Temp 99.4 F (37.4 C) (Oral)   Resp 16   SpO2 97%  Gen:   Awake, no distress   Resp:  Normal effort  MSK:   Moves extremities without difficulty  Other:  Abdomen tender to palpation  Medical Decision Making  Medically screening exam initiated at 11:50 AM.  Appropriate orders placed.  Jonathan Rivas was informed that the remainder of the evaluation will be completed by another provider, this initial triage assessment does not replace that evaluation, and the importance of remaining in the ED until their evaluation is complete.   Carmin Muskrat, MD 09/22/22 1151

## 2022-09-22 NOTE — Telephone Encounter (Signed)
[facility] appointment with [facility] physician before further refills  

## 2022-09-22 NOTE — ED Triage Notes (Signed)
Pt reports generalized abd pain x1 day with n/v. Tums with no relief.  Hx of gastric sleeve. Denies having GI doctor.

## 2022-09-22 NOTE — ED Provider Notes (Signed)
[facility] DEPT Provider Note   CSN: [id number] Arrival date & time: 09/22/22  1032     History  Chief Complaint  Patient presents with   Abdominal Pain    Jonathan Rivas is a 51 y.o. male.   Abdominal Pain Patient presents upper abdominal pain.  Began yesterday.  Worse with eating.  Has had some nausea and vomiting.  Nausea and vomiting did not help the pain.  Decreased appetite.  No fevers.    Past Medical History:  Diagnosis Date   Allergic rhinitis    Allergy    At risk for diabetes mellitus 10/28/2018   Blood transfusion without reported diagnosis    with bariatric surgery   Depression    Erectile dysfunction    Erectile dysfunction 04/10/2015   Hyperlipidemia 06/22/2019   Insomnia 12/28/2017   Morbid obesity ([place]) 04/10/2015   Numbness and tingling in both hands 09/25/2016   Obesity, morbid, BMI 40.0-49.9 ([place])    Palpitations 09/25/2016   Pure hypercholesterolemia 06/22/2019   ASCVD 10-yr risk ~ 3% (2021)   Tobacco use    Tubular adenoma of colon 06/14/2021   Tubular adenoma colon x 1 (< 1 cm)   Urinary incontinence, post-void dribbling 10/29/2018   Past Surgical History:  Procedure Laterality Date   CARDIAC CATHETERIZATION N/A 10/03/2016   No CAD: Procedure: Left Heart Cath and Coronary Angiography;  Surgeon: Troy Sine, MD;  Location: [facility];  Service: Cardiovascular;  Laterality: N/A;   COLONOSCOPY  06/07/2021   CYSTOSCOPY     Hematuria workup.  No recalled significant findings   LAPAROSCOPIC GASTRIC SLEEVE RESECTION N/A 11/28/2019   Procedure: LAPAROSCOPIC GASTRIC SLEEVE RESECTION, UPPER Endo, Eras Pathway;  Surgeon: Johnathan Hausen, MD;  Location: [facility];  Service: General;  Laterality: N/A;   wisdom teeth       Home Medications Prior to Admission medications   Medication Sig Start Date End Date Taking? Authorizing Provider  calcium-vitamin D 250-100 MG-UNIT tablet Take 2 tablets by mouth in the  morning, at noon, and at bedtime. 03/07/21   McDiarmid, Blane Ohara, MD  emtricitabine-tenofovir (TRUVADA) 200-300 MG tablet Take 1 tablet by mouth daily. 09/22/22   McDiarmid, Blane Ohara, MD  gabapentin (NEURONTIN) 300 MG capsule Take 1 capsule (300 mg total) by mouth 3 (three) times daily for 7 days. 08/15/22 08/22/22  McDonald, Stephan Minister, DPM  loratadine (CLARITIN) 10 MG tablet Take 10 mg by mouth at bedtime.    [provider]  Multiple Vitamins-Minerals (BARIATRIC MULTIVITAMINS/IRON) CAPS Take 1 capsule by mouth daily. 03/07/21   McDiarmid, Blane Ohara, MD  mupirocin ointment (BACTROBAN) 2 % Apply topically daily as directed. 09/01/22   McDonald, Stephan Minister, DPM  rOPINIRole (REQUIP) 2 MG tablet TAKE 1 TABLET BY MOUTH AT BEDTIME. 03/12/22 03/07/23  McDiarmid, Blane Ohara, MD  venlafaxine XR (EFFEXOR XR) 37.5 MG 24 hr capsule Take 1 capsule (37.5 mg total) by mouth daily with breakfast. 07/22/22 09/20/22  McDiarmid, Blane Ohara, MD      Allergies    Patient has no known allergies.    Review of Systems   Review of Systems  Gastrointestinal:  Positive for abdominal pain.    Physical Exam Updated Vital Signs BP 130/80   Pulse (!) 57   Temp 98.3 F (36.8 C) (Oral)   Resp 18   SpO2 99%  Physical Exam Vitals and nursing note reviewed.  HENT:     Head: Atraumatic.  Cardiovascular:  Rate and Rhythm: Regular rhythm.  Abdominal:     Tenderness: There is abdominal tenderness.     Hernia: No hernia is present.     Comments: Mild upper abdominal tenderness without rebound or guarding.  No hernia palpated.  Skin:    General: Skin is warm.  Neurological:     Mental Status: He is alert.     ED Results / Procedures / Treatments   Labs (all labs ordered are listed, but only abnormal results are displayed) Labs Reviewed  COMPREHENSIVE METABOLIC PANEL - Abnormal; Notable for the following components:      Result Value   Potassium 5.2 (*)    Glucose, Bld 103 (*)    AST 152 (*)    ALT 127 (*)    Total  Bilirubin 2.5 (*)    All other components within normal limits  LIPASE, BLOOD - Abnormal; Notable for the following components:   Lipase 210 (*)    All other components within normal limits  CBC WITH DIFFERENTIAL/PLATELET - Abnormal; Notable for the following components:   WBC 13.7 (*)    Neutro Abs 11.0 (*)    All other components within normal limits  URINALYSIS, ROUTINE W REFLEX MICROSCOPIC    EKG None  Radiology CT ABDOMEN PELVIS W CONTRAST  Result Date: 09/22/2022 CLINICAL DATA:  Nausea vomiting EXAM: CT ABDOMEN AND PELVIS WITH CONTRAST TECHNIQUE: Multidetector CT imaging of the abdomen and pelvis was performed using the standard protocol following bolus administration of intravenous contrast. RADIATION DOSE REDUCTION: This exam was performed according to the departmental dose-optimization program which includes automated exposure control, adjustment of the mA and/or kV according to patient size and/or use of iterative reconstruction technique. CONTRAST:  134m OMNIPAQUE IOHEXOL 300 MG/ML  SOLN COMPARISON:  None FINDINGS: Lower chest: No acute abnormality. Hepatobiliary: There is likely mild hepatic steatosis. There are no focal hepatic lesions. There is evidence of intrahepatic biliary ductal dilatation. Common bile duct is normal in size. The gallbladder is distended with mild surrounding soft tissue stranding. Pancreas: Unremarkable. No pancreatic ductal dilatation or surrounding inflammatory changes. Spleen: Normal in size without focal abnormality. Adrenals/Urinary Tract: Adrenal glands are unremarkable. Kidneys are normal, without renal calculi or hydronephrosis. There is a small hypodense lesion along the posterior margin of the left kidney, which most likely represents small renal cysts requiring no additional follow-up. Urinary bladder is partially decompressed with evidence of circumferential bladder wall thickening. Stomach/Bowel: Postsurgical changes from gastrectomy. The stomach,  small bowel, large bowel are otherwise normal in caliber. Is no evidence of focal wall thickening. The appendix is normal in appearance. Vascular/Lymphatic: No significant vascular findings are present. No enlarged abdominal or pelvic lymph nodes. Reproductive: Prostate is unremarkable. Other: No abdominal wall hernia or abnormality. No abdominopelvic ascites. Musculoskeletal: No acute or significant osseous findings. IMPRESSION: 1. Distended gallbladder with mild surrounding soft tissue stranding. Findings are concerning for acute cholecystitis. Recommend right upper quadrant ultrasound for further evaluation. There is evidence of intrahepatic biliary ductal dilatation with a normal sized common bile duct and pancreatic duct. This may be due to a stone in the proximal common bile duct, mass effect from a stone in the cystic duct, or secondary to reactive narrowing in the setting of cholecystitis 2. Circumferential bladder wall thickening, which may be secondary to underdistention, cystitis or chronic bladder outlet obstruction. Correlate with urinalysis. Electronically Signed   By: HMarin RobertsM.D.   On: 09/22/2022 14:40    Procedures Procedures    Medications Ordered in  ED Medications  0.9 %  sodium chloride infusion ( Intravenous New Bag/Given 09/22/22 1929)  fentaNYL (SUBLIMAZE) injection 50 mcg (has no administration in time range)  fentaNYL (SUBLIMAZE) injection 50 mcg (50 mcg Intravenous Given 09/22/22 1225)  ondansetron (ZOFRAN) injection 4 mg (4 mg Intravenous Given 09/22/22 1225)  iohexol (OMNIPAQUE) 300 MG/ML solution 100 mL (100 mLs Intravenous Contrast Given 09/22/22 1414)  sodium chloride (PF) 0.9 % injection (  Given by Other 09/22/22 1916)    ED Course/ Medical Decision Making/ A&P                           Medical Decision Making Amount and/or Complexity of Data Reviewed Radiology: ordered.  Risk Prescription drug management.   Patient with abdominal pain.  Upper  abdomen.  Began yesterday.  Some nausea.  Lab work reviewed and showed LFTs mildly elevated.  White count also elevated.  Lipase was also 200.  Did have a small amount of hemolysis.  CT scan done and did show potentially cholecystitis.  Wall thickening with pericholecystic fluid.  Also some intrahepatic biliary dilatation.  However common bile duct and pancreatic duct were reassuring.  We will get ultrasound.  Will discuss with general surgery.  Discussed with Dr. Harlow Asa from general surgery.  We will get the ultrasound.  Admit to medicine with the pancreatitis.  Repeat lab work in morning.  They will follow.  Likely no operation tomorrow and can do clear liquids tonight.  If LFTs continue to rise may need more GI involvement.        Final Clinical Impression(s) / ED Diagnoses Final diagnoses:  Cholecystitis  Acute biliary pancreatitis, unspecified complication status    Rx / DC Orders ED Discharge Orders     None         Davonna Belling, MD 09/22/22 1944

## 2022-09-23 ENCOUNTER — Inpatient Hospital Stay (HOSPITAL_COMMUNITY): Payer: No Typology Code available for payment source | Admitting: Certified Registered Nurse Anesthetist

## 2022-09-23 ENCOUNTER — Inpatient Hospital Stay (HOSPITAL_BASED_OUTPATIENT_CLINIC_OR_DEPARTMENT_OTHER): Payer: No Typology Code available for payment source | Admitting: Certified Registered Nurse Anesthetist

## 2022-09-23 ENCOUNTER — Encounter (HOSPITAL_COMMUNITY)
Admission: EM | Disposition: A | Discharge status: Home / Self Care | Payer: Self-pay | Source: Home / Self Care | Attending: Emergency Medicine

## 2022-09-23 ENCOUNTER — Inpatient Hospital Stay (HOSPITAL_COMMUNITY): Payer: No Typology Code available for payment source

## 2022-09-23 ENCOUNTER — Encounter: Payer: Self-pay | Admitting: Family Medicine

## 2022-09-23 ENCOUNTER — Other Ambulatory Visit (HOSPITAL_COMMUNITY): Payer: Self-pay

## 2022-09-23 ENCOUNTER — Other Ambulatory Visit: Payer: Self-pay

## 2022-09-23 DIAGNOSIS — K819 Cholecystitis, unspecified: Secondary | ICD-10-CM | POA: Diagnosis not present

## 2022-09-23 DIAGNOSIS — K810 Acute cholecystitis: Secondary | ICD-10-CM

## 2022-09-23 DIAGNOSIS — K851 Biliary acute pancreatitis without necrosis or infection: Secondary | ICD-10-CM

## 2022-09-23 HISTORY — PX: CHOLECYSTECTOMY: SHX55

## 2022-09-23 LAB — CBC WITH DIFFERENTIAL/PLATELET
Abs Immature Granulocytes: 0.02 10*3/uL (ref 0.00–0.07)
Basophils Absolute: 0.1 10*3/uL (ref 0.0–0.1)
Basophils Relative: 1 %
Eosinophils Absolute: 0.2 10*3/uL (ref 0.0–0.5)
Eosinophils Relative: 2 %
HCT: 39.9 % (ref 39.0–52.0)
Hemoglobin: 12.8 g/dL — ABNORMAL LOW (ref 13.0–17.0)
Immature Granulocytes: 0 %
Lymphocytes Relative: 34 %
Lymphs Abs: 2.8 10*3/uL (ref 0.7–4.0)
MCH: 30.3 pg (ref 26.0–34.0)
MCHC: 32.1 g/dL (ref 30.0–36.0)
MCV: 94.3 fL (ref 80.0–100.0)
Monocytes Absolute: 0.6 10*3/uL (ref 0.1–1.0)
Monocytes Relative: 8 %
Neutro Abs: 4.5 10*3/uL (ref 1.7–7.7)
Neutrophils Relative %: 55 %
Platelets: 203 10*3/uL (ref 150–400)
RBC: 4.23 MIL/uL (ref 4.22–5.81)
RDW: 13.8 % (ref 11.5–15.5)
WBC: 8.2 10*3/uL (ref 4.0–10.5)
nRBC: 0 % (ref 0.0–0.2)

## 2022-09-23 LAB — MAGNESIUM: Magnesium: 1.8 mg/dL (ref 1.7–2.4)

## 2022-09-23 LAB — COMPREHENSIVE METABOLIC PANEL
ALT: 124 U/L — ABNORMAL HIGH (ref 0–44)
AST: 91 U/L — ABNORMAL HIGH (ref 15–41)
Albumin: 3.6 g/dL (ref 3.5–5.0)
Alkaline Phosphatase: 72 U/L (ref 38–126)
Anion gap: 8 (ref 5–15)
BUN: 8 mg/dL (ref 6–20)
CO2: 25 mmol/L (ref 22–32)
Calcium: 8.5 mg/dL — ABNORMAL LOW (ref 8.9–10.3)
Chloride: 108 mmol/L (ref 98–111)
Creatinine, Ser: 0.68 mg/dL (ref 0.61–1.24)
GFR, Estimated: >60 mL/min (ref >60)
Glucose, Bld: 85 mg/dL (ref 70–99)
Potassium: 3.9 mmol/L (ref 3.5–5.1)
Sodium: 141 mmol/L (ref 135–145)
Total Bilirubin: 2.1 mg/dL — ABNORMAL HIGH (ref 0.3–1.2)
Total Protein: 6.2 g/dL — ABNORMAL LOW (ref 6.5–8.1)

## 2022-09-23 LAB — LIPASE, BLOOD: Lipase: 57 U/L — ABNORMAL HIGH (ref 11–51)

## 2022-09-23 LAB — PHOSPHORUS: Phosphorus: 3.7 mg/dL (ref 2.5–4.6)

## 2022-09-23 SURGERY — LAPAROSCOPIC CHOLECYSTECTOMY WITH INTRAOPERATIVE CHOLANGIOGRAM
Anesthesia: General

## 2022-09-23 MED ORDER — SUGAMMADEX SODIUM 200 MG/2ML IV SOLN
INTRAVENOUS | Status: DC | PRN
  Administered 2022-09-23: 200 mg via INTRAVENOUS

## 2022-09-23 MED ORDER — ACETAMINOPHEN 500 MG PO TABS
1000.0000 mg | ORAL_TABLET | Freq: Four times a day (QID) | ORAL | Status: DC
  Administered 2022-09-23: 1000 mg via ORAL
  Filled 2022-09-23: qty 2

## 2022-09-23 MED ORDER — ORAL CARE MOUTH RINSE: 15.0000 mL | Freq: Once | OROMUCOSAL | Status: AC

## 2022-09-23 MED ORDER — LIDOCAINE HCL (PF) 2 % IJ SOLN
INTRAMUSCULAR | Status: AC
  Filled 2022-09-23: qty 5

## 2022-09-23 MED ORDER — PHENYLEPHRINE HCL (PRESSORS) 10 MG/ML IV SOLN
INTRAVENOUS | Status: AC
  Filled 2022-09-23: qty 1

## 2022-09-23 MED ORDER — FENTANYL CITRATE (PF) 100 MCG/2ML IJ SOLN
INTRAMUSCULAR | Status: DC | PRN
  Administered 2022-09-23 (×3): 50 ug via INTRAVENOUS
  Administered 2022-09-23: 100 ug via INTRAVENOUS

## 2022-09-23 MED ORDER — DEXAMETHASONE SODIUM PHOSPHATE 10 MG/ML IJ SOLN
INTRAMUSCULAR | Status: DC | PRN
  Administered 2022-09-23: 10 mg via INTRAVENOUS

## 2022-09-23 MED ORDER — CEFAZOLIN SODIUM-DEXTROSE 2-4 GM/100ML-% IV SOLN
INTRAVENOUS | Status: AC
  Filled 2022-09-23: qty 100

## 2022-09-23 MED ORDER — ROCURONIUM BROMIDE 10 MG/ML (PF) SYRINGE
PREFILLED_SYRINGE | INTRAVENOUS | Status: AC
  Filled 2022-09-23: qty 10

## 2022-09-23 MED ORDER — FENTANYL CITRATE (PF) 250 MCG/5ML IJ SOLN
INTRAMUSCULAR | Status: AC
  Filled 2022-09-23: qty 5

## 2022-09-23 MED ORDER — KETAMINE HCL 50 MG/5ML IJ SOSY
PREFILLED_SYRINGE | INTRAMUSCULAR | Status: AC
  Filled 2022-09-23: qty 5

## 2022-09-23 MED ORDER — LIDOCAINE 2% (20 MG/ML) 5 ML SYRINGE
INTRAMUSCULAR | Status: DC | PRN
  Administered 2022-09-23: 100 mg via INTRAVENOUS

## 2022-09-23 MED ORDER — DEXAMETHASONE SODIUM PHOSPHATE 10 MG/ML IJ SOLN
INTRAMUSCULAR | Status: AC
  Filled 2022-09-23: qty 1

## 2022-09-23 MED ORDER — BUPIVACAINE-EPINEPHRINE (PF) 0.5% -1:200000 IJ SOLN
INTRAMUSCULAR | Status: AC
  Filled 2022-09-23: qty 30

## 2022-09-23 MED ORDER — HEMOSTATIC AGENTS (NO CHARGE) OPTIME
TOPICAL | Status: DC | PRN
  Administered 2022-09-23: 2 via TOPICAL

## 2022-09-23 MED ORDER — ACETAMINOPHEN 500 MG PO TABS
ORAL_TABLET | ORAL | Status: AC
  Filled 2022-09-23: qty 1

## 2022-09-23 MED ORDER — PROPOFOL 10 MG/ML IV BOLUS
INTRAVENOUS | Status: DC | PRN
  Administered 2022-09-23: 200 mg via INTRAVENOUS

## 2022-09-23 MED ORDER — CHLORHEXIDINE GLUCONATE 0.12 % MT SOLN
15.0000 mL | Freq: Once | OROMUCOSAL | Status: AC
  Administered 2022-09-23: 15 mL via OROMUCOSAL

## 2022-09-23 MED ORDER — ACETAMINOPHEN 500 MG PO TABS: 1000.0000 mg | ORAL_TABLET | Freq: Four times a day (QID) | ORAL | 2 refills | Status: DC | PRN

## 2022-09-23 MED ORDER — BUPIVACAINE-EPINEPHRINE 0.5% -1:200000 IJ SOLN
INTRAMUSCULAR | Status: DC | PRN
  Administered 2022-09-23: 30 mL

## 2022-09-23 MED ORDER — SODIUM CHLORIDE 0.9 % IV SOLN: INTRAVENOUS | Status: DC

## 2022-09-23 MED ORDER — LACTATED RINGERS IR SOLN
Status: DC | PRN
  Administered 2022-09-23: 1000 mL

## 2022-09-23 MED ORDER — ACETAMINOPHEN 500 MG PO TABS
1000.0000 mg | ORAL_TABLET | Freq: Once | ORAL | Status: AC
  Administered 2022-09-23: 1000 mg via ORAL
  Filled 2022-09-23: qty 2

## 2022-09-23 MED ORDER — ONDANSETRON HCL 4 MG/2ML IJ SOLN
INTRAMUSCULAR | Status: AC
  Filled 2022-09-23: qty 2

## 2022-09-23 MED ORDER — LACTATED RINGERS IV SOLN: INTRAVENOUS | Status: DC

## 2022-09-23 MED ORDER — KETAMINE HCL 10 MG/ML IJ SOLN
INTRAMUSCULAR | Status: DC | PRN
  Administered 2022-09-23: 20 mg via INTRAVENOUS
  Administered 2022-09-23: 30 mg via INTRAVENOUS

## 2022-09-23 MED ORDER — OXYCODONE HCL 5 MG PO TABS
5.0000 mg | ORAL_TABLET | ORAL | Status: DC | PRN
  Administered 2022-09-23: 5 mg via ORAL
  Filled 2022-09-23: qty 1

## 2022-09-23 MED ORDER — ONDANSETRON HCL 4 MG/2ML IJ SOLN
INTRAMUSCULAR | Status: DC | PRN
  Administered 2022-09-23: 4 mg via INTRAVENOUS

## 2022-09-23 MED ORDER — ROCURONIUM BROMIDE 10 MG/ML (PF) SYRINGE
PREFILLED_SYRINGE | INTRAVENOUS | Status: DC | PRN
  Administered 2022-09-23: 10 mg via INTRAVENOUS
  Administered 2022-09-23: 60 mg via INTRAVENOUS
  Administered 2022-09-23: 10 mg via INTRAVENOUS

## 2022-09-23 MED ORDER — IOHEXOL 300 MG/ML  SOLN
INTRAMUSCULAR | Status: DC | PRN
  Administered 2022-09-23: 20 mL

## 2022-09-23 MED ORDER — FENTANYL CITRATE PF 50 MCG/ML IJ SOSY
PREFILLED_SYRINGE | INTRAMUSCULAR | Status: AC
  Filled 2022-09-23: qty 2

## 2022-09-23 MED ORDER — FENTANYL CITRATE PF 50 MCG/ML IJ SOSY
PREFILLED_SYRINGE | INTRAMUSCULAR | Status: AC
  Filled 2022-09-23: qty 1

## 2022-09-23 MED ORDER — MIDAZOLAM HCL 2 MG/2ML IJ SOLN
INTRAMUSCULAR | Status: AC
  Filled 2022-09-23: qty 2

## 2022-09-23 MED ORDER — 0.9 % SODIUM CHLORIDE (POUR BTL) OPTIME
TOPICAL | Status: DC | PRN
  Administered 2022-09-23: 1000 mL

## 2022-09-23 MED ORDER — CEFAZOLIN SODIUM-DEXTROSE 2-4 GM/100ML-% IV SOLN
2.0000 g | Freq: Once | INTRAVENOUS | Status: AC
  Administered 2022-09-23: 2 g via INTRAVENOUS

## 2022-09-23 MED ORDER — MIDAZOLAM HCL 5 MG/5ML IJ SOLN
INTRAMUSCULAR | Status: DC | PRN
  Administered 2022-09-23: 2 mg via INTRAVENOUS

## 2022-09-23 MED ORDER — FENTANYL CITRATE PF 50 MCG/ML IJ SOSY
25.0000 ug | PREFILLED_SYRINGE | INTRAMUSCULAR | Status: DC | PRN
  Administered 2022-09-23 (×3): 50 ug via INTRAVENOUS

## 2022-09-23 MED ORDER — OXYCODONE HCL 5 MG PO TABS
5.0000 mg | ORAL_TABLET | ORAL | 0 refills | Status: DC | PRN
  Filled 2022-09-23: qty 20, 4d supply, fill #0

## 2022-09-23 MED ORDER — PROPOFOL 10 MG/ML IV BOLUS
INTRAVENOUS | Status: AC
  Filled 2022-09-23: qty 20

## 2022-09-23 MED ORDER — ENOXAPARIN SODIUM 40 MG/0.4ML IJ SOSY: 40.0000 mg | PREFILLED_SYRINGE | INTRAMUSCULAR | Status: DC

## 2022-09-23 MED ORDER — PHENYLEPHRINE HCL-NACL 20-0.9 MG/250ML-% IV SOLN
INTRAVENOUS | Status: DC | PRN
  Administered 2022-09-23: 35 ug/min via INTRAVENOUS

## 2022-09-23 MED ORDER — HYDROMORPHONE HCL 1 MG/ML IJ SOLN
0.5000 mg | INTRAMUSCULAR | Status: DC | PRN
  Administered 2022-09-23: 0.5 mg via INTRAVENOUS
  Filled 2022-09-23: qty 0.5

## 2022-09-23 MED ORDER — PROMETHAZINE HCL 25 MG/ML IJ SOLN: 6.2500 mg | INTRAMUSCULAR | Status: DC | PRN

## 2022-09-23 SURGICAL SUPPLY — 41 items
APPLIER CLIP ROT 10 11.4 M/L (STAPLE) ×1
BAG COUNTER SPONGE SURGICOUNT (BAG) IMPLANT
CABLE HIGH FREQUENCY MONO STRZ (ELECTRODE) ×1 IMPLANT
CATH URETL OPEN 5X70 (CATHETERS) IMPLANT
CHLORAPREP W/TINT 26 (MISCELLANEOUS) ×1 IMPLANT
CLIP APPLIE ROT 10 11.4 M/L (STAPLE) ×1 IMPLANT
COVER MAYO STAND XLG (MISCELLANEOUS) ×1 IMPLANT
COVER SURGICAL LIGHT HANDLE (MISCELLANEOUS) ×1 IMPLANT
DERMABOND ADVANCED .7 DNX12 (GAUZE/BANDAGES/DRESSINGS) ×1 IMPLANT
DRAPE C-ARM 42X120 X-RAY (DRAPES) IMPLANT
ELECT REM PT RETURN 15FT ADLT (MISCELLANEOUS) ×1 IMPLANT
ENDOLOOP SUT PDS II  0 18 (SUTURE) ×1
ENDOLOOP SUT PDS II 0 18 (SUTURE) ×1 IMPLANT
GLOVE BIO SURGEON STRL SZ7.5 (GLOVE) ×1 IMPLANT
GLOVE BIOGEL PI IND STRL 8 (GLOVE) ×1 IMPLANT
GOWN STRL REUS W/ TWL XL LVL3 (GOWN DISPOSABLE) ×2 IMPLANT
GOWN STRL REUS W/TWL XL LVL3 (GOWN DISPOSABLE) ×2
GRASPER SUT TROCAR 14GX15 (MISCELLANEOUS) IMPLANT
HEMOSTAT SNOW SURGICEL 2X4 (HEMOSTASIS) IMPLANT
IRRIG SUCT STRYKERFLOW 2 WTIP (MISCELLANEOUS) ×1
IRRIGATION SUCT STRKRFLW 2 WTP (MISCELLANEOUS) ×1 IMPLANT
IV CATH 14GX2 1/4 (CATHETERS) ×1 IMPLANT
KIT BASIN OR (CUSTOM PROCEDURE TRAY) ×1 IMPLANT
KIT TURNOVER KIT A (KITS) IMPLANT
NDL INSUFFLATION 14GA 120MM (NEEDLE) ×1 IMPLANT
NEEDLE INSUFFLATION 14GA 120MM (NEEDLE) ×1 IMPLANT
PENCIL SMOKE EVACUATOR (MISCELLANEOUS) IMPLANT
POUCH RETRIEVAL ECOSAC 10 (ENDOMECHANICALS) ×1 IMPLANT
POUCH RETRIEVAL ECOSAC 10MM (ENDOMECHANICALS) ×1
SCISSORS LAP 5X35 DISP (ENDOMECHANICALS) ×1 IMPLANT
SET TUBE SMOKE EVAC HIGH FLOW (TUBING) ×1 IMPLANT
SLEEVE Z-THREAD 5X100MM (TROCAR) ×2 IMPLANT
SPIKE FLUID TRANSFER (MISCELLANEOUS) ×1 IMPLANT
STOPCOCK 4 WAY LG BORE MALE ST (IV SETS) IMPLANT
SUT MNCRL AB 4-0 PS2 18 (SUTURE) ×1 IMPLANT
TOWEL OR 17X26 10 PK STRL BLUE (TOWEL DISPOSABLE) ×1 IMPLANT
TOWEL OR NON WOVEN STRL DISP B (DISPOSABLE) IMPLANT
TRAY LAPAROSCOPIC (CUSTOM PROCEDURE TRAY) ×1 IMPLANT
TROCAR 11X100 Z THREAD (TROCAR) IMPLANT
TROCAR ADV FIXATION 12X100MM (TROCAR) ×1 IMPLANT
TROCAR Z-THREAD OPTICAL 5X100M (TROCAR) ×1 IMPLANT

## 2022-09-23 NOTE — Anesthesia Preprocedure Evaluation (Addendum)
Anesthesia Evaluation  Patient identified by MRN, date of birth, ID band Patient awake    Reviewed: Allergy & Precautions, NPO status , Patient's Chart, lab work & pertinent test results  Airway Mallampati: III  TM Distance: >3 FB Neck ROM: Full    Dental  (+) Teeth Intact, Dental Advisory Given, Caps,    Pulmonary former smoker,    Pulmonary exam normal breath sounds clear to auscultation       Cardiovascular negative cardio ROS Normal cardiovascular exam Rhythm:Regular Rate:Normal     Neuro/Psych PSYCHIATRIC DISORDERS Depression negative neurological ROS     GI/Hepatic negative GI ROS, Cholecystitis    Endo/Other  negative endocrine ROS  Renal/GU negative Renal ROS     Musculoskeletal negative musculoskeletal ROS (+)   Abdominal   Peds  Hematology  (+) Blood dyscrasia, anemia ,   Anesthesia Other Findings Day of surgery medications reviewed with the patient.  Reproductive/Obstetrics                            Anesthesia Physical Anesthesia Plan  ASA: 2  Anesthesia Plan: General   Post-op Pain Management: Tylenol PO (pre-op)*   Induction: Intravenous  PONV Risk Score and Plan: 3 and Midazolam, Dexamethasone and Ondansetron  Airway Management Planned: Oral ETT  Additional Equipment:   Intra-op Plan:   Post-operative Plan: Extubation in OR  Informed Consent: I have reviewed the patients History and Physical, chart, labs and discussed the procedure including the risks, benefits and alternatives for the proposed anesthesia with the patient or authorized representative who has indicated his/her understanding and acceptance.     Dental advisory given  Plan Discussed with: CRNA  Anesthesia Plan Comments:         Anesthesia Quick Evaluation

## 2022-09-23 NOTE — Anesthesia Procedure Notes (Signed)
Procedure Name: Intubation Date/Time: 09/23/2022 11:12 AM  Performed by: Maxwell Caul, CRNAPre-anesthesia Checklist: Patient identified, Emergency Drugs available, Suction available and Patient being monitored Patient Re-evaluated:Patient Re-evaluated prior to induction Oxygen Delivery Method: Circle system utilized Preoxygenation: Pre-oxygenation with 100% oxygen Induction Type: IV induction Ventilation: Mask ventilation without difficulty Laryngoscope Size: Mac and 4 Grade View: Grade I Tube type: Oral Tube size: 7.5 mm Number of attempts: 1 Airway Equipment and Method: Stylet Placement Confirmation: ETT inserted through vocal cords under direct vision, positive ETCO2 and breath sounds checked- equal and bilateral Secured at: 21 cm Tube secured with: Tape Dental Injury: Teeth and Oropharynx as per pre-operative assessment

## 2022-09-23 NOTE — Transfer of Care (Signed)
Immediate Anesthesia Transfer of Care Note  Patient: Jonathan Rivas  Procedure(s) Performed: LAPAROSCOPIC CHOLECYSTECTOMY WITH INTRAOPERATIVE CHOLANGIOGRAM  Patient Location: PACU  Anesthesia Type:General  Level of Consciousness: awake, alert  and oriented  Airway & Oxygen Therapy: Patient Spontanous Breathing and Patient connected to face mask oxygen  Post-op Assessment: Report given to RN and Post -op Vital signs reviewed and stable  Post vital signs: Reviewed and stable  Last Vitals:  Vitals Value Taken Time  BP 175/91 09/23/22 1239  Temp    Pulse 81 09/23/22 1241  Resp 22 09/23/22 1241  SpO2 100 % 09/23/22 1241  Vitals shown include unvalidated device data.  Last Pain:  Vitals:   09/23/22 0908  TempSrc: Oral  PainSc: 0-No pain      Patients Stated Pain Goal: 4 (42/90/37 9558)  Complications: No notable events documented.

## 2022-09-23 NOTE — Discharge Summary (Signed)
Physician Discharge Summary  Jonathan Rivas LPF:[id number] DOB: [birth year] DOA: 09/22/2022  PCP: McDiarmid, Blane Ohara, MD  Admit date: 09/22/2022 Discharge date: 09/23/2022 Discharging to: home   Consults:  General surgery Procedures:  Lap chole with IOC   Discharge Diagnoses:   Principal Problem:   Acute cholecystitis Active Problems:   Major depression in complete remission ([place])   Acute gallstone pancreatitis   Restless leg syndrome, controlled   S/P laparoscopic sleeve gastrectomy     Hospital Course:  Brief hospital course: This is a 51 year old-year-old male with HIV, restless leg syndrome laparoscopic sleeve gastrectomy in 2021 who presents to the hospital for radiating to the back. In the ED: Low-grade temperature of 99 4 degrees Lipase and LFTs noted to be elevated. Potassium 5.2 and WBC count 13 point. CT scan of the abdomen and pelvis revealed a distended gallbladder with surrounding soft tissue stranding concerning for acute cholecystitis, intrahepatic biliary ductal dilatation without bile duct or pancreatic duct dilatation and bladder wall thickening. Right upper quadrant ultrasound revealed cholelithiasis without evidence of acute cholecystitis. He was evaluated by general surgery and lap chole with IOC was recommended.  Principal Problem:   Acute cholecystitis - possible acute gallstone pancreatitis - s/p lap chole today - IOC negative - ok to dc home from general surgery perspective - tolerating diet and pain controlled after surgery today  Active Problems:   Major depression in complete remission ([place])   Restless leg syndrome, controlled   S/P laparoscopic sleeve gastrectomy           Discharge Instructions  Discharge Instructions     Diet - low sodium heart healthy   Complete by: As directed    Increase activity slowly   Complete by: As directed       Allergies as of 09/23/2022   No Known Allergies      Medication List      TAKE these medications    acetaminophen 500 MG tablet Commonly known as: TYLENOL Take 2 tablets (1,000 mg total) by mouth every 6 (six) hours as needed.   Bariatric Multivitamins/Iron Caps Take 1 capsule by mouth daily.   Calcium 500-2.5 MG-MCG Chew Chew 1 tablet by mouth in the morning, at noon, and at bedtime.   loratadine 10 MG tablet Commonly known as: CLARITIN Take 10 mg by mouth at bedtime.   mupirocin ointment 2 % Commonly known as: BACTROBAN Apply topically daily as directed.   oxyCODONE 5 MG immediate release tablet Commonly known as: Oxy IR/ROXICODONE Take 1 tablet (5 mg total) by mouth every 4 (four) hours as needed for moderate pain.   rOPINIRole 2 MG tablet Commonly known as: REQUIP TAKE 1 TABLET BY MOUTH AT BEDTIME.        Follow-up Information     Maczis, Carlena Hurl, PA-C Follow up on 10/14/2022.   Specialty: General Surgery Why: 10 am, Arrive 30 minutes prior to your appointment time, Please bring your insurance card and photo ID Contact information: [place] [address] [phone]         McDiarmid, Blane Ohara, MD Follow up.   Specialty: Family Medicine Contact information: [place] [address] [phone]                    The results of significant diagnostics from this hospitalization (including imaging, microbiology, ancillary and laboratory) are listed below for reference.    DG Cholangiogram Operative  Result Date:  09/23/2022 CLINICAL DATA:  Intraoperative cholangiogram during laparoscopic cholecystectomy. EXAM: INTRAOPERATIVE CHOLANGIOGRAM FLUOROSCOPY TIME:  18 seconds (9 mGy) COMPARISON:  Right upper quadrant abdominal ultrasound-09/22/2022; CT abdomen pelvis-09/22/2022 FINDINGS: Intraoperative cholangiographic images of the right upper abdominal quadrant during laparoscopic cholecystectomy are provided for review. Surgical clips overlie the  expected location of the gallbladder fossa. Contrast injection demonstrates selective cannulation of the central aspect of the cystic duct. There is passage of contrast through the central aspect of the cystic duct with filling of a non dilated common bile duct. There is passage of contrast though the CBD and into the descending portion of the duodenum. There is minimal reflux of injected contrast into the common hepatic duct and central aspect of the non dilated intrahepatic biliary system. There are no discrete filling defects within the opacified portions of the biliary system to suggest the presence of choledocholithiasis. IMPRESSION: No evidence of choledocholithiasis. Electronically Signed   By: Sandi Mariscal M.D.   On: 09/23/2022 12:15   US Abdomen Limited RUQ (LIVER/GB)  Result Date: 09/22/2022 CLINICAL DATA:  Right upper quadrant abdominal pain EXAM: ULTRASOUND ABDOMEN LIMITED RIGHT UPPER QUADRANT COMPARISON:  None Available. FINDINGS: Gallbladder: Multiple layering gallstones are seen within a mildly distended gallbladder. However, there is no gallbladder wall thickening, and no pericholecystic fluid is identified. The sonographic Percell Miller sign is reportedly negative. Common bile duct: Diameter: 5 mm in mid diameter Liver: No focal lesion identified. Within normal limits in parenchymal echogenicity. Portal vein is patent on color Doppler imaging with normal direction of blood flow towards the liver. Other: None. IMPRESSION: 1. Cholelithiasis without sonographic evidence of acute cholecystitis. Electronically Signed   By: Fidela Salisbury M.D.   On: 09/22/2022 20:28   CT ABDOMEN PELVIS W CONTRAST  Result Date: 09/22/2022 CLINICAL DATA:  Nausea vomiting EXAM: CT ABDOMEN AND PELVIS WITH CONTRAST TECHNIQUE: Multidetector CT imaging of the abdomen and pelvis was performed using the standard protocol following bolus administration of intravenous contrast. RADIATION DOSE REDUCTION: This exam was performed  according to the departmental dose-optimization program which includes automated exposure control, adjustment of the mA and/or kV according to patient size and/or use of iterative reconstruction technique. CONTRAST:  188m OMNIPAQUE IOHEXOL 300 MG/ML  SOLN COMPARISON:  None FINDINGS: Lower chest: No acute abnormality. Hepatobiliary: There is likely mild hepatic steatosis. There are no focal hepatic lesions. There is evidence of intrahepatic biliary ductal dilatation. Common bile duct is normal in size. The gallbladder is distended with mild surrounding soft tissue stranding. Pancreas: Unremarkable. No pancreatic ductal dilatation or surrounding inflammatory changes. Spleen: Normal in size without focal abnormality. Adrenals/Urinary Tract: Adrenal glands are unremarkable. Kidneys are normal, without renal calculi or hydronephrosis. There is a small hypodense lesion along the posterior margin of the left kidney, which most likely represents small renal cysts requiring no additional follow-up. Urinary bladder is partially decompressed with evidence of circumferential bladder wall thickening. Stomach/Bowel: Postsurgical changes from gastrectomy. The stomach, small bowel, large bowel are otherwise normal in caliber. Is no evidence of focal wall thickening. The appendix is normal in appearance. Vascular/Lymphatic: No significant vascular findings are present. No enlarged abdominal or pelvic lymph nodes. Reproductive: Prostate is unremarkable. Other: No abdominal wall hernia or abnormality. No abdominopelvic ascites. Musculoskeletal: No acute or significant osseous findings. IMPRESSION: 1. Distended gallbladder with mild surrounding soft tissue stranding. Findings are concerning for acute cholecystitis. Recommend right upper quadrant ultrasound for further evaluation. There is evidence of intrahepatic biliary ductal dilatation with a normal sized common bile duct and  pancreatic duct. This may be due to a stone in the  proximal common bile duct, mass effect from a stone in the cystic duct, or secondary to reactive narrowing in the setting of cholecystitis 2. Circumferential bladder wall thickening, which may be secondary to underdistention, cystitis or chronic bladder outlet obstruction. Correlate with urinalysis. Electronically Signed   By: Marin Roberts M.D.   On: 09/22/2022 14:40   Labs:   Basic Metabolic Panel: Recent Labs  Lab 09/22/22 1152 09/23/22 0402  NA 136 141  K 5.2* 3.9  CL 104 108  CO2 27 25  GLUCOSE 103* 85  BUN 10 8  CREATININE 0.75 0.68  CALCIUM 9.1 8.5*  MG  --  1.8  PHOS  --  3.7     CBC: Recent Labs  Lab 09/22/22 1152 09/23/22 0402  WBC 13.7* 8.2  NEUTROABS 11.0* 4.5  HGB 13.6 12.8*  HCT 42.7 39.9  MCV 94.5 94.3  PLT 252 203         SIGNED:   Debbe Odea, MD  [facility] 09/23/2022, 4:22 PM

## 2022-09-23 NOTE — Discharge Instructions (Signed)
[facility]  Please arrive at least 30 min before your appointment to complete your check in paperwork.  If you are unable to arrive 30 min prior to your appointment time we may have to cancel or reschedule you. LAPAROSCOPIC SURGERY: POST OP INSTRUCTIONS Always review your discharge instruction sheet given to you by the facility where your surgery was performed. IF YOU HAVE DISABILITY OR FAMILY LEAVE FORMS, YOU MUST BRING THEM TO THE OFFICE FOR PROCESSING.   DO NOT GIVE THEM TO YOUR DOCTOR.  PAIN CONTROL  First take acetaminophen (Tylenol) AND/or ibuprofen (Advil) to control your pain after surgery.  Follow directions on package.  Taking acetaminophen (Tylenol) and/or ibuprofen (Advil) regularly after surgery will help to control your pain and lower the amount of prescription pain medication you may need.  You should not take more than 4,000 mg (4 grams) of acetaminophen (Tylenol) in 24 hours.  You should not take ibuprofen (Advil), aleve, motrin, naprosyn or other NSAIDS if you have a history of stomach ulcers or chronic kidney disease.  A prescription for pain medication may be given to you upon discharge.  Take your pain medication as prescribed, if you still have uncontrolled pain after taking acetaminophen (Tylenol) or ibuprofen (Advil). Use ice packs to help control pain. If you need a refill on your pain medication, please contact your pharmacy.  They will contact our office to request authorization. Prescriptions will not be filled after 5pm or on week-ends.  HOME MEDICATIONS Take your usually prescribed medications unless otherwise directed.  DIET You should follow a light diet the first few days after arrival home.  Be sure to include lots of fluids daily. Avoid fatty, fried foods.   CONSTIPATION It is common to experience some constipation after surgery and if you are taking pain medication.  Increasing fluid intake and taking a stool softener (such as Colace)  will usually help or prevent this problem from occurring.  A mild laxative (Milk of Magnesia or Miralax) should be taken according to package instructions if there are no bowel movements after 48 hours.  WOUND/INCISION CARE Most patients will experience some swelling and bruising in the area of the incisions.  Ice packs will help.  Swelling and bruising can take several days to resolve.  Unless discharge instructions indicate otherwise, follow guidelines below  STERI-STRIPS - you may remove your outer bandages 48 hours after surgery, and you may shower at that time.  You have steri-strips (small skin tapes) in place directly over the incision.  These strips should be left on the skin for 7-10 days.   DERMABOND/SKIN GLUE - you may shower in 24 hours.  The glue will flake off over the next 2-3 weeks. Any sutures or staples will be removed at the office during your follow-up visit.  ACTIVITIES You may resume regular (light) daily activities beginning the next day--such as daily self-care, walking, climbing stairs--gradually increasing activities as tolerated.  You may have sexual intercourse when it is comfortable.  Refrain from any heavy lifting or straining until approved by your doctor. You may drive when you are no longer taking prescription pain medication, you can comfortably wear a seatbelt, and you can safely maneuver your car and apply brakes.  FOLLOW-UP You should see your doctor in the office for a follow-up appointment approximately 2-3 weeks after your surgery.  You should have been given your post-op/follow-up appointment when your surgery was scheduled.  If you did not receive a post-op/follow-up appointment, make sure   that you call for this appointment within a day or two after you arrive home to insure a convenient appointment time.   WHEN TO CALL YOUR DOCTOR: Fever over 101.0 Inability to urinate Continued bleeding from incision. Increased pain, redness, or drainage from the  incision. Increasing abdominal pain  The clinic staff is available to answer your questions during regular business hours.  Please don't hesitate to call and ask to speak to one of the nurses for clinical concerns.  If you have a medical emergency, go to the nearest emergency room or call 911.  A surgeon from [facility] is always on call at the hospital. [address], [address], [place], [place]  [address] ? [address] [address], [place], [place]   [address] [phone] ? [phone] ? FAX [phone]  

## 2022-09-23 NOTE — Op Note (Signed)
Patient: Jonathan Rivas (November 24, 1971, [id number])  Date of Surgery: 09/23/22  Preoperative Diagnosis: Cholecystitis   Postoperative Diagnosis: Cholecystitis   Surgical Procedure: LAPAROSCOPIC CHOLECYSTECTOMY WITH INTRAOPERATIVE CHOLANGIOGRAM: 64403 (CPT)   Operative Team Members:  Surgeon(s) and Role:    * Cleona Doubleday, Nickola Major, MD - Primary   Anesthesiologist: Santa Lighter, MD CRNA: Montel Clock, CRNA; Maxwell Caul, CRNA   Anesthesia: General   Fluids:  Total I/O In: 1500 [I.V.:1400; IV Piggyback:100] Out: 79 [KVQQV:95]  Complications: * No complications entered in OR log *  Drains:  none   Specimen:  ID Type Source Tests Collected by Time Destination  1 : gallbladder Tissue PATH Other SURGICAL PATHOLOGY Tsuneo Faison, Nickola Major, MD 09/23/2022 1156      Disposition:  PACU - hemodynamically stable.  Plan of Care: Okay for discharge after PACU    Indications for Procedure: TERION HEDMAN is a 51 y.o. male who presented with abdominal pain.  History, physical and imaging was concerning for cholecystitis.  Laparoscopic cholecystectomy was recommended for the patient.  The procedure itself, as well as the risks, benefits and alternatives were discussed with the patient.  Risks discussed included but were not limited to the risk of infection, bleeding, damage to nearby structures, need to convert to open procedure, incisional hernia, bile leak, common bile duct injury and the need for additional procedures or surgeries.  With this discussion complete and all questions answered the patient granted consent to proceed.  Findings: Normal cholangiogram, inflamed gallbladder  Infection status: Patient: Jonathan Rivas Emergency General Surgery Service Patient Case: Urgent Infection Present At Time Of Surgery (PATOS):  some spillage of bile related to performing a cholangiogram   Description of Procedure:   On the date stated above, the patient was taken to the  operating room suite and placed in supine positioning.  Sequential compression devices were placed on the lower extremities to prevent blood clots.  General endotracheal anesthesia was induced. Preoperative antibiotics were given.  The patient's abdomen was prepped and draped in the usual sterile fashion.  A time-out was completed verifying the correct patient, procedure, positioning and equipment needed for the case.  We began by anesthetizing the skin with local anesthetic and then making a 5 mm incision just below the umbilicus.  We dissected through the subcutaneous tissues to the fascia.  The fascia was grasped and elevated using a Kocher clamp.  A Veress needle was inserted into the abdomen and the abdomen was insufflated to 15 mmHg.  A 5 mm trocar was inserted in this position under optical guidance and then the abdomen was inspected.  There was no trauma to the underlying viscera with initial trocar placement.  Any abnormal findings, other than inflammation in the right upper quadrant, are listed above in the findings section.  Three additional trocars were placed, one 12 mm trocar in the subxiphoid position, one 5 mm trocar in the midline epigastric area and one 76m trocar in the right upper quadrant subcostally.  These were placed under direct vision without any trauma to the underlying viscera.    The patient was then placed in head up, left side down positioning.  The gallbladder was identified and dissected free from its attachments to the omentum allowing the duodenum to fall away.  The infundibulum of the gallbladder was dissected free working laterally to medially.  The cystic duct and cystic artery were dissected free from surrounding connective tissue.  The infundibulum of the gallbladder was dissected off the  cystic plate.  A critical view of safety was obtained with the cystic duct and cystic artery being cleared of connective tissues and clearly the only two structures entering into the  gallbladder with the liver clearly visible behind.  One clip was applied high on the cystic duct.  A small ductotomy was created below this using the endoscopic shears.  A cholangiogram catheter was introduced through the abdominal wall and into the cystic duct through this ductotomy.  The catheter was clipped into position.  The catheter was flushed to ensure no leakage around the clip.  We then removed the laparoscopic instruments and positioned the C-Arm to perform a cholangiogram.  The catheter was flushed with contrast under fluoroscopic visualization and a cholangiogram was obtained.  The cholangiogram visualized the biliary tree from the ampulla up to the first two biliary radicals in the liver.  There were no filling defects identified.  The catheter clearly entered the cystic duct.  There was gradual tapering of the common bile duct down to the ampulla without evidence of stricture or other abnormalities.  Please see the EMR for saved representative images.  With our cholangiogram compete, we moved the c-arm away from the field and returned to laparoscopic surgery.    Clips were then applied to the cystic duct and cystic artery and then these structures were divided.  The gallbladder was dissected off the cystic plate, placed in an endocatch bag and removed from the 12 mm subxiphoid port site.  The clips were inspected and appeared effective.  The cystic plate was inspected and hemostasis was obtained using electrocautery and snow topical hemostatic agent.  A suction irrigator was used to clean the operative field.  Attention was turned to closure.  The 12 mm subxiphoid port site was closed using a 0-vicryl suture on a fascial suture passer.  The abdomen was desufflated.  The skin was closed using 4-0 monocryl and dermabond.  All sponge and needle counts were correct at the conclusion of the case.    Louanna Raw, MD General, Bariatric, & Minimally Invasive Surgery [facility],  [place]

## 2022-09-23 NOTE — Consult Note (Signed)
Consulting Physician: Nickola Major Talonda Artist  Referring Provider: Dr. Alvino Chapel - ER Provider  Chief Complaint: Abdominal pain  Reason for Consult: cholecystitis   Subjective   HPI: Jonathan Rivas is an 51 y.o. male who is here for abdominal pain.  He went to a [employer], then had a normal day of eating.  Then developed severe epigastric abdominal pain.  He tried to wait at home but the pain was miserable.  He had associated nausea and vomiting.  He has never had pain like this before.    Past Medical History:  Diagnosis Date   Allergic rhinitis    Allergy    At risk for diabetes mellitus 10/28/2018   Blood transfusion without reported diagnosis    with bariatric surgery   Depression    Erectile dysfunction    Erectile dysfunction 04/10/2015   Hyperlipidemia 06/22/2019   Insomnia 12/28/2017   Morbid obesity ([place]) 04/10/2015   Numbness and tingling in both hands 09/25/2016   Obesity, morbid, BMI 40.0-49.9 ([place])    Palpitations 09/25/2016   Pure hypercholesterolemia 06/22/2019   ASCVD 10-yr risk ~ 3% (2021)   Tobacco use    Tubular adenoma of colon 06/14/2021   Tubular adenoma colon x 1 (< 1 cm)   Urinary incontinence, post-void dribbling 10/29/2018    Past Surgical History:  Procedure Laterality Date   CARDIAC CATHETERIZATION N/A 10/03/2016   No CAD: Procedure: Left Heart Cath and Coronary Angiography;  Surgeon: Troy Sine, MD;  Location: [facility];  Service: Cardiovascular;  Laterality: N/A;   COLONOSCOPY  06/07/2021   CYSTOSCOPY     Hematuria workup.  No recalled significant findings   LAPAROSCOPIC GASTRIC SLEEVE RESECTION N/A 11/28/2019   Procedure: LAPAROSCOPIC GASTRIC SLEEVE RESECTION, UPPER Endo, Eras Pathway;  Surgeon: Johnathan Hausen, MD;  Location: [facility];  Service: General;  Laterality: N/A;   wisdom teeth      Family History  Problem Relation Age of Onset   Cancer Mother        Leukemia   Heart disease Father        Age 10's    Depression Brother    Alcohol abuse Brother    Drug abuse Brother    Asthma Maternal Aunt    Alcohol abuse Maternal Aunt    Heart disease Paternal Uncle    Heart disease Paternal Grandfather    Colon cancer Neg Hx    Colon polyps Neg Hx    Esophageal cancer Neg Hx    Stomach cancer Neg Hx    Rectal cancer Neg Hx     Social:  reports that he quit smoking about 4 years ago. His smoking use included cigarettes. He has a 6.25 pack-year smoking history. He has never used smokeless tobacco. He reports that he does not currently use alcohol after a past usage of about 2.0 standard drinks of alcohol per week. He reports that he does not use drugs.  Allergies: No Known Allergies  Medications: Current Outpatient Medications  Medication Instructions   Calcium 500-2.5 MG-MCG CHEW 1 tablet, Oral, 3 times daily   emtricitabine-tenofovir (TRUVADA) 200-300 MG tablet 1 tablet, Oral, Daily   gabapentin (NEURONTIN) 300 mg, Oral, 3 times daily   loratadine (CLARITIN) 10 mg, Oral, Daily at bedtime   Multiple Vitamins-Minerals (BARIATRIC MULTIVITAMINS/IRON) CAPS 1 capsule, Oral, Daily   mupirocin ointment (BACTROBAN) 2 % Apply topically daily as directed.   rOPINIRole (REQUIP) 2 MG tablet TAKE 1 TABLET BY MOUTH AT BEDTIME.  venlafaxine XR (EFFEXOR XR) 37.5 mg, Oral, Daily with breakfast    ROS - all of the below systems have been reviewed with the patient and positives are indicated with bold text General: chills, fever or night sweats Eyes: blurry vision or double vision ENT: epistaxis or sore throat Allergy/Immunology: itchy/watery eyes or nasal congestion Hematologic/Lymphatic: bleeding problems, blood clots or swollen lymph nodes Endocrine: temperature intolerance or unexpected weight changes Breast: new or changing breast lumps or nipple discharge Resp: cough, shortness of breath, or wheezing CV: chest pain or dyspnea on exertion GI: as per HPI GU: dysuria, trouble voiding, or  hematuria MSK: joint pain or joint stiffness Neuro: TIA or stroke symptoms Derm: pruritus and skin lesion changes Psych: anxiety and depression  Objective   PE Blood pressure 104/63, pulse 69, temperature 98.6 F (37 C), temperature source Oral, resp. rate 16, height '5\' 9"'$  (1.753 m), weight 83.1 kg, SpO2 98 %. Constitutional: NAD; conversant; no deformities Eyes: Moist conjunctiva; no lid lag; anicteric; PERRL Neck: Trachea midline; no thyromegaly Lungs: Normal respiratory effort; no tactile fremitus CV: RRR; no palpable thrills; no pitting edema GI: Abd Soft, mild epigastric tenderness; no palpable hepatosplenomegaly MSK: Normal range of motion of extremities; no clubbing/cyanosis Psychiatric: Appropriate affect; alert and oriented x3 Lymphatic: No palpable cervical or axillary lymphadenopathy  Results for orders placed or performed during the hospital encounter of 09/22/22 (from the past 24 hour(s))  Comprehensive metabolic panel     Status: Abnormal   Collection Time: 09/22/22 11:52 AM  Result Value Ref Range   Sodium 136 135 - 145 mmol/L   Potassium 5.2 (H) 3.5 - 5.1 mmol/L   Chloride 104 98 - 111 mmol/L   CO2 27 22 - 32 mmol/L   Glucose, Bld 103 (H) 70 - 99 mg/dL   BUN 10 6 - 20 mg/dL   Creatinine, Ser 0.75 0.61 - 1.24 mg/dL   Calcium 9.1 8.9 - 10.3 mg/dL   Total Protein 7.4 6.5 - 8.1 g/dL   Albumin 4.1 3.5 - 5.0 g/dL   AST 152 (H) 15 - 41 U/L   ALT 127 (H) 0 - 44 U/L   Alkaline Phosphatase 72 38 - 126 U/L   Total Bilirubin 2.5 (H) 0.3 - 1.2 mg/dL   GFR, Estimated >60 >60 mL/min   Anion gap 5 5 - 15  Lipase, blood     Status: Abnormal   Collection Time: 09/22/22 11:52 AM  Result Value Ref Range   Lipase 210 (H) 11 - 51 U/L  CBC with Diff     Status: Abnormal   Collection Time: 09/22/22 11:52 AM  Result Value Ref Range   WBC 13.7 (H) 4.0 - 10.5 K/uL   RBC 4.52 4.22 - 5.81 MIL/uL   Hemoglobin 13.6 13.0 - 17.0 g/dL   HCT 42.7 39.0 - 52.0 %   MCV 94.5 80.0 - 100.0  fL   MCH 30.1 26.0 - 34.0 pg   MCHC 31.9 30.0 - 36.0 g/dL   RDW 13.8 11.5 - 15.5 %   Platelets 252 150 - 400 K/uL   nRBC 0.0 0.0 - 0.2 %   Neutrophils Relative % 81 %   Neutro Abs 11.0 (H) 1.7 - 7.7 K/uL   Lymphocytes Relative 15 %   Lymphs Abs 2.0 0.7 - 4.0 K/uL   Monocytes Relative 4 %   Monocytes Absolute 0.6 0.1 - 1.0 K/uL   Eosinophils Relative 0 %   Eosinophils Absolute 0.0 0.0 - 0.5 K/uL   Basophils  Relative 0 %   Basophils Absolute 0.1 0.0 - 0.1 K/uL   Immature Granulocytes 0 %   Abs Immature Granulocytes 0.04 0.00 - 0.07 K/uL  Urinalysis, Routine w reflex microscopic Urine, Clean Catch     Status: Abnormal   Collection Time: 09/22/22  9:15 PM  Result Value Ref Range   Color, Urine YELLOW YELLOW   APPearance CLEAR CLEAR   Specific Gravity, Urine 1.009 1.005 - 1.030   pH 7.0 5.0 - 8.0   Glucose, UA NEGATIVE NEGATIVE mg/dL   Hgb urine dipstick SMALL (A) NEGATIVE   Bilirubin Urine NEGATIVE NEGATIVE   Ketones, ur NEGATIVE NEGATIVE mg/dL   Protein, ur NEGATIVE NEGATIVE mg/dL   Nitrite NEGATIVE NEGATIVE   Leukocytes,Ua NEGATIVE NEGATIVE   RBC / HPF 0-5 0 - 5 RBC/hpf   WBC, UA 0-5 0 - 5 WBC/hpf   Bacteria, UA NONE SEEN NONE SEEN   Squamous Epithelial / LPF 0-5 0 - 5  CBC with Differential/Platelet     Status: Abnormal   Collection Time: 09/23/22  4:02 AM  Result Value Ref Range   WBC 8.2 4.0 - 10.5 K/uL   RBC 4.23 4.22 - 5.81 MIL/uL   Hemoglobin 12.8 (L) 13.0 - 17.0 g/dL   HCT 39.9 39.0 - 52.0 %   MCV 94.3 80.0 - 100.0 fL   MCH 30.3 26.0 - 34.0 pg   MCHC 32.1 30.0 - 36.0 g/dL   RDW 13.8 11.5 - 15.5 %   Platelets 203 150 - 400 K/uL   nRBC 0.0 0.0 - 0.2 %   Neutrophils Relative % 55 %   Neutro Abs 4.5 1.7 - 7.7 K/uL   Lymphocytes Relative 34 %   Lymphs Abs 2.8 0.7 - 4.0 K/uL   Monocytes Relative 8 %   Monocytes Absolute 0.6 0.1 - 1.0 K/uL   Eosinophils Relative 2 %   Eosinophils Absolute 0.2 0.0 - 0.5 K/uL   Basophils Relative 1 %   Basophils Absolute 0.1 0.0  - 0.1 K/uL   Immature Granulocytes 0 %   Abs Immature Granulocytes 0.02 0.00 - 0.07 K/uL  Comprehensive metabolic panel     Status: Abnormal   Collection Time: 09/23/22  4:02 AM  Result Value Ref Range   Sodium 141 135 - 145 mmol/L   Potassium 3.9 3.5 - 5.1 mmol/L   Chloride 108 98 - 111 mmol/L   CO2 25 22 - 32 mmol/L   Glucose, Bld 85 70 - 99 mg/dL   BUN 8 6 - 20 mg/dL   Creatinine, Ser 0.68 0.61 - 1.24 mg/dL   Calcium 8.5 (L) 8.9 - 10.3 mg/dL   Total Protein 6.2 (L) 6.5 - 8.1 g/dL   Albumin 3.6 3.5 - 5.0 g/dL   AST 91 (H) 15 - 41 U/L   ALT 124 (H) 0 - 44 U/L   Alkaline Phosphatase 72 38 - 126 U/L   Total Bilirubin 2.1 (H) 0.3 - 1.2 mg/dL   GFR, Estimated >60 >60 mL/min   Anion gap 8 5 - 15  Magnesium     Status: None   Collection Time: 09/23/22  4:02 AM  Result Value Ref Range   Magnesium 1.8 1.7 - 2.4 mg/dL  Phosphorus     Status: None   Collection Time: 09/23/22  4:02 AM  Result Value Ref Range   Phosphorus 3.7 2.5 - 4.6 mg/dL  Lipase, blood     Status: Abnormal   Collection Time: 09/23/22  4:02 AM  Result Value  Ref Range   Lipase 57 (H) 11 - 51 U/L     Imaging Orders         CT ABDOMEN PELVIS W CONTRAST         US Abdomen Limited RUQ (LIVER/GB)     Imaging reviewed.  Gallstones, inflammation surrounding gallbladder on CT with some concern for ductal dilation, normal CBD diameter (12m) and gallstones on ultrasound  Assessment and Plan   Jonathan ASATOis an 51y.o. male with abdominal pain and gallstones concerning for cholecystitis.  He had mild elevation in his bilirubin and lipase concerning for choledocholithiasis with possible gallstone pancreatitis which has passed and improved.  I recommend laparoscopic cholecystectomy with intraoperative cholangiogram.  The procedure, its risks, benefits and alternatives were discussed with the patient as well as his husband, and the patient granted consent to proceed.  We will proceed to the OR later this  morning.    Jonathan Morn MD  [facility] Use [email or web address] to contact on call provider  New Patient Billing: [phone]- High MDM

## 2022-09-24 ENCOUNTER — Telehealth: Payer: Self-pay

## 2022-09-24 ENCOUNTER — Encounter (HOSPITAL_COMMUNITY): Payer: Self-pay | Admitting: Surgery

## 2022-09-24 LAB — SURGICAL PATHOLOGY

## 2022-09-24 NOTE — Patient Outreach (Signed)
Opened in error; transition of care

## 2022-09-24 NOTE — Anesthesia Postprocedure Evaluation (Signed)
Anesthesia Post Note  Patient: Jonathan Rivas  Procedure(s) Performed: LAPAROSCOPIC CHOLECYSTECTOMY WITH INTRAOPERATIVE CHOLANGIOGRAM     Patient location during evaluation: PACU Anesthesia Type: General Level of consciousness: awake and alert Pain management: pain level controlled Vital Signs Assessment: post-procedure vital signs reviewed and stable Respiratory status: spontaneous breathing, nonlabored ventilation, respiratory function stable and patient connected to nasal cannula oxygen Cardiovascular status: blood pressure returned to baseline and stable Postop Assessment: no apparent nausea or vomiting Anesthetic complications: no   No notable events documented.  Last Vitals:  Vitals:   09/23/22 1330 09/23/22 1340  BP: (!) 145/82 (!) 162/81  Pulse: 88 64  Resp: 20 15  Temp: 36.6 C 36.9 C  SpO2: 94% 97%    Last Pain:  Vitals:   09/23/22 1740  TempSrc:   PainSc: [place]

## 2022-09-25 ENCOUNTER — Encounter: Payer: No Typology Code available for payment source | Admitting: Podiatry

## 2022-09-29 ENCOUNTER — Ambulatory Visit (INDEPENDENT_AMBULATORY_CARE_PROVIDER_SITE_OTHER): Payer: No Typology Code available for payment source

## 2022-09-29 ENCOUNTER — Ambulatory Visit (INDEPENDENT_AMBULATORY_CARE_PROVIDER_SITE_OTHER): Payer: No Typology Code available for payment source | Admitting: Podiatry

## 2022-09-29 DIAGNOSIS — L600 Ingrowing nail: Secondary | ICD-10-CM | POA: Diagnosis not present

## 2022-09-29 DIAGNOSIS — M21611 Bunion of right foot: Secondary | ICD-10-CM

## 2022-09-29 DIAGNOSIS — M2011 Hallux valgus (acquired), right foot: Secondary | ICD-10-CM

## 2022-09-29 NOTE — Addendum Note (Signed)
Addended bySherryle Lis, Jenefer Woerner R on: 09/29/2022 01:00 PM   Modules accepted: Level of Service

## 2022-09-29 NOTE — Progress Notes (Addendum)
  Subjective:  Patient ID: Jonathan Rivas, male    DOB: 1971/03/31,  MRN: [id number]  Chief Complaint  Patient presents with   Routine Post Op    POV #3 DOS 08/15/2022 RT FOOT BUNION CORRECTION (LAPIDUS) POSS BONE CUT IN BIG TOE [name]), BONE GRAFT FROM HEEL     51 y.o. male returns for post-op check.  Continues to do well.Not having much pain  Review of Systems: Negative except as noted in the HPI. Denies N/V/F/Ch.   Objective:  There were no vitals filed for this visit. There is no height or weight on file to calculate BMI. Constitutional Well developed. Well nourished.  Vascular Foot warm and well perfused. Capillary refill normal to all digits.  Calf is soft and supple, no posterior calf or knee pain, negative Homans' sign  Neurologic Normal speech. Oriented to person, place, and time. Epicritic sensation to light touch grossly present bilaterally.  Dermatologic Incision is well-healed and not hypertrophic  Orthopedic: Edema has improved, he has no pain to palpation, good range of motion of MTPJ   Multiple view plain film radiographs: Correction is maintained no complication of hardware there is good early consolidation across the majority of the Lapidus fusion site, some plantar medial remains, some residual healing needs to occur at the [facility] osteotomy site Assessment:   1. Hallux valgus with bunions, right     Plan:  Patient was evaluated and treated and all questions answered.  S/p foot surgery right -Overall doing well I reviewed his radiographs with him he can transition from the cam boot to a surgical shoe and be weightbearing as tolerated at this point.  This was dispensed today.  Discussed after 10/10/2022 he may resume supportive regular shoe gear such as a running shoe or sneaker.  Continue range of motion exercises for the toe.  I will see him back in 6 weeks for new radiographs.  If comfortable and regular sugar may begin low impact exercise as  tolerated   He did have incurvated borders of the right hallux nail today, I debrided this in a slant back fashion which alleviated his pain and pressure.  We discussed surgical matricectomy of this if it did not improve or worsens  Return in about 6 weeks (around 11/10/2022) for post op (new x-rays).

## 2022-10-10 ENCOUNTER — Other Ambulatory Visit (HOSPITAL_COMMUNITY): Payer: Self-pay

## 2022-10-13 ENCOUNTER — Other Ambulatory Visit (HOSPITAL_COMMUNITY): Payer: Self-pay

## 2022-10-15 ENCOUNTER — Other Ambulatory Visit (HOSPITAL_COMMUNITY): Payer: Self-pay

## 2022-10-17 ENCOUNTER — Other Ambulatory Visit (HOSPITAL_COMMUNITY): Payer: Self-pay

## 2022-10-20 ENCOUNTER — Telehealth: Payer: No Typology Code available for payment source | Admitting: Physician Assistant

## 2022-10-20 DIAGNOSIS — M5440 Lumbago with sciatica, unspecified side: Secondary | ICD-10-CM

## 2022-10-20 MED ORDER — ETODOLAC 300 MG PO CAPS: 300.0000 mg | ORAL_CAPSULE | Freq: Two times a day (BID) | ORAL | 0 refills | Status: AC

## 2022-10-20 MED ORDER — BACLOFEN 10 MG PO TABS: 10.0000 mg | ORAL_TABLET | Freq: Three times a day (TID) | ORAL | 0 refills | Status: DC

## 2022-10-20 NOTE — Progress Notes (Signed)

## 2022-10-21 ENCOUNTER — Encounter: Payer: Self-pay | Admitting: Family Medicine

## 2022-10-21 NOTE — Telephone Encounter (Signed)
I believe I removed the Holter order that was placed by cardiology

## 2022-10-24 ENCOUNTER — Telehealth: Payer: No Typology Code available for payment source | Admitting: Family Medicine

## 2022-10-24 DIAGNOSIS — M545 Low back pain, unspecified: Secondary | ICD-10-CM

## 2022-10-24 NOTE — Progress Notes (Signed)
Because of on going symptoms and concern for prostatitis, I feel your condition warrants further evaluation and I recommend that you be seen in a face to face visit.

## 2022-10-25 ENCOUNTER — Ambulatory Visit: Payer: Self-pay

## 2022-10-25 ENCOUNTER — Ambulatory Visit
Admission: EM | Admit: 2022-10-25 | Discharge: 2022-10-25 | Disposition: A | Discharge status: Home / Self Care | Payer: No Typology Code available for payment source | Attending: Family Medicine | Admitting: Family Medicine

## 2022-10-25 ENCOUNTER — Inpatient Hospital Stay
Admission: RE | Admit: 2022-10-25 | Discharge: 2022-10-25 | Disposition: A | Discharge status: Home / Self Care | Payer: No Typology Code available for payment source | Source: Ambulatory Visit | Attending: Family Medicine | Admitting: Family Medicine

## 2022-10-25 DIAGNOSIS — R109 Unspecified abdominal pain: Secondary | ICD-10-CM

## 2022-10-25 DIAGNOSIS — Z87438 Personal history of other diseases of male genital organs: Secondary | ICD-10-CM | POA: Diagnosis not present

## 2022-10-25 LAB — POCT URINALYSIS DIP (MANUAL ENTRY)
Blood, UA: NEGATIVE
Glucose, UA: NEGATIVE mg/dL
Ketones, POC UA: NEGATIVE mg/dL
Leukocytes, UA: NEGATIVE
Nitrite, UA: NEGATIVE
Protein Ur, POC: 30 mg/dL — AB
Spec Grav, UA: 1.02 (ref 1.010–1.025)
Urobilinogen, UA: 0.2 E.U./dL
pH, UA: 7 (ref 5.0–8.0)

## 2022-10-25 MED ORDER — SULFAMETHOXAZOLE-TRIMETHOPRIM 800-160 MG PO TABS: 1.0000 | ORAL_TABLET | Freq: Two times a day (BID) | ORAL | 0 refills | Status: AC

## 2022-10-25 NOTE — ED Triage Notes (Signed)
Pt reports right lower back pain for 2 weeks now. Was prescribed some antiinflammatory drug and muscle relaxer but no relief.   Pain radiates to his lower right abdomen. Had gallbladder surgery 3 weeks ago. Is concerned about kidney stones.

## 2022-10-25 NOTE — Discharge Instructions (Signed)
I have sent over a course of broad-spectrum antibiotics to hopefully cover for a possible prostate infection.  If your symptoms continue to worsen go to the emergency department for further evaluation.  Follow-up with your primary care provider as soon as possible or you may always come back here for a recheck

## 2022-10-25 NOTE — ED Provider Notes (Signed)
[facility]    CSN: [id number] Arrival date & time: 10/25/22  0935      History   Chief Complaint No chief complaint on file.   HPI Jonathan Rivas is a 51 y.o. male.   Presenting today with 2-week history of right lower back pain that is now sometimes radiating to the right lower quadrant of the abdomen and down to groin.  He states he did an e-visit near onset of symptoms and was given an anti-inflammatory and muscle relaxer which provided some mild relief initially but states yesterday even this did not help and symptoms were at an 8 out of 10 pain wise.  He is unable to figure out what triggers the pain as it seems fairly random but severe when it comes on.  He denies worsening with movement, urination, bowel movements, eating and denies any associated nausea, vomiting, diarrhea, constipation, fevers, chills, urinary symptoms.  He notes he had something similar to this years ago and ended up after 6 months of significant symptoms and numerous emergency room visits being prostatitis.  He would like to cover for this today as this has happened in the past to him.  He is also concerned about kidney stones.  Of note, he did have his gallbladder removed 3 weeks ago due to cholecystitis.  States he is healing from this has been without difficulty.    Past Medical History:  Diagnosis Date   Allergic rhinitis    Allergy    At risk for diabetes mellitus 10/28/2018   Blood transfusion without reported diagnosis    with bariatric surgery   Depression    Erectile dysfunction    Erectile dysfunction 04/10/2015   Hyperlipidemia 06/22/2019   Insomnia 12/28/2017   Morbid obesity ([place]) 04/10/2015   Numbness and tingling in both hands 09/25/2016   Obesity, morbid, BMI 40.0-49.9 ([place])    Palpitations 09/25/2016   Pure hypercholesterolemia 06/22/2019   ASCVD 10-yr risk ~ 3% (2021)   Syncope and collapse 06/06/2022   Tobacco use    Tubular adenoma of colon 06/14/2021   Tubular  adenoma colon x 1 (< 1 cm)   Urinary incontinence, post-void dribbling 10/29/2018    Patient Active Problem List   Diagnosis Date Noted   Acute cholecystitis 09/23/2022   Acute gallstone pancreatitis 09/23/2022   Allergic rhinitis    S/P laparoscopic sleeve gastrectomy 11/28/2019   Pure hypercholesterolemia 06/22/2019   Prediabetes 10/28/2018   HIV Pre-exposure Prophylaxis therapy 04/10/2015   Major depression in complete remission ([place]) 04/10/2015   Restless leg syndrome, controlled 04/10/2015    Past Surgical History:  Procedure Laterality Date   CARDIAC CATHETERIZATION N/A 10/03/2016   No CAD: Procedure: Left Heart Cath and Coronary Angiography;  Surgeon: Troy Sine, MD;  Location: [facility];  Service: Cardiovascular;  Laterality: N/A;   CHOLECYSTECTOMY N/A 09/23/2022   Procedure: LAPAROSCOPIC CHOLECYSTECTOMY WITH INTRAOPERATIVE CHOLANGIOGRAM;  Surgeon: Felicie Morn, MD;  Location: [facility];  Service: General;  Laterality: N/A;   COLONOSCOPY  06/07/2021   CYSTOSCOPY     Hematuria workup.  No recalled significant findings   LAPAROSCOPIC GASTRIC SLEEVE RESECTION N/A 11/28/2019   Procedure: LAPAROSCOPIC GASTRIC SLEEVE RESECTION, UPPER Endo, Eras Pathway;  Surgeon: Johnathan Hausen, MD;  Location: [facility];  Service: General;  Laterality: N/A;   wisdom teeth         Home Medications    Prior to Admission medications   Medication Sig Start Date End Date Taking? Authorizing Provider  sulfamethoxazole-trimethoprim (BACTRIM DS) 800-160 MG tablet Take 1 tablet by mouth 2 (two) times daily for 10 days. 10/25/22 11/04/22 Yes Volney American, PA-C  acetaminophen (TYLENOL) 500 MG tablet Take 2 tablets (1,000 mg total) by mouth every 6 (six) hours as needed. 09/23/22 09/23/23  Saverio Danker, PA-C  baclofen (LIORESAL) 10 MG tablet Take 1 tablet (10 mg total) by mouth 3 (three) times daily. 10/20/22   Couture, Cortni S, PA-C  Calcium 500-2.5 MG-MCG CHEW Chew 1  tablet by mouth in the morning, at noon, and at bedtime.    [provider]  etodolac (LODINE) 300 MG capsule Take 1 capsule (300 mg total) by mouth 2 (two) times daily for 7 days. 10/20/22 10/27/22  Couture, Cortni S, PA-C  loratadine (CLARITIN) 10 MG tablet Take 10 mg by mouth at bedtime.    [provider]  Multiple Vitamins-Minerals (BARIATRIC MULTIVITAMINS/IRON) CAPS Take 1 capsule by mouth daily. 03/07/21   McDiarmid, Blane Ohara, MD  mupirocin ointment (BACTROBAN) 2 % Apply topically daily as directed. 09/01/22   McDonald, Stephan Minister, DPM  oxyCODONE (OXY IR/ROXICODONE) 5 MG immediate release tablet Take 1 tablet (5 mg total) by mouth every 4 (four) hours as needed for moderate pain. 09/23/22   Saverio Danker, PA-C  rOPINIRole (REQUIP) 2 MG tablet TAKE 1 TABLET BY MOUTH AT BEDTIME. 03/12/22 03/07/23  McDiarmid, Blane Ohara, MD    Family History Family History  Problem Relation Age of Onset   Cancer Mother        Leukemia   Heart disease Father        Age 54's   Depression Brother    Alcohol abuse Brother    Drug abuse Brother    Asthma Maternal Aunt    Alcohol abuse Maternal Aunt    Heart disease Paternal Uncle    Heart disease Paternal Grandfather    Colon cancer Neg Hx    Colon polyps Neg Hx    Esophageal cancer Neg Hx    Stomach cancer Neg Hx    Rectal cancer Neg Hx     Social History Social History   Tobacco Use   Smoking status: Former    Packs/day: 0.25    Years: 25.00    Total pack years: 6.25    Types: Cigarettes    Quit date: 04/27/2018    Years since quitting: 4.4   Smokeless tobacco: Never   Tobacco comments:    last cigarette was 2 days ago 11/20/2019  Vaping Use   Vaping Use: Never used  Substance Use Topics   Alcohol use: Not Currently    Alcohol/week: 2.0 standard drinks of alcohol    Types: 2 Glasses of wine per week   Drug use: No     Allergies   Patient has no known allergies.   Review of Systems Review of Systems Per HPI  Physical  Exam Triage Vital Signs ED Triage Vitals  Enc Vitals Group     BP 10/25/22 1002 124/72     Pulse Rate 10/25/22 1002 69     Resp 10/25/22 1002 20     Temp 10/25/22 1002 98.2 F (36.8 C)     Temp Source 10/25/22 1002 Oral     SpO2 10/25/22 1002 97 %     Weight --      Height --      Head Circumference --      Peak Flow --      Pain Score 10/25/22 1005 5  Pain Loc --      Pain Edu? --      Excl. in [place]? --    No data found.  Updated Vital Signs BP 124/72 (BP Location: Right Arm)   Pulse 69   Temp 98.2 F (36.8 C) (Oral)   Resp 20   SpO2 97%   Visual Acuity Right Eye Distance:   Left Eye Distance:   Bilateral Distance:    Right Eye Near:   Left Eye Near:    Bilateral Near:     Physical Exam Vitals and nursing note reviewed.  Constitutional:      Appearance: Normal appearance.  HENT:     Head: Atraumatic.     Mouth/Throat:     Mouth: Mucous membranes are moist.  Eyes:     Extraocular Movements: Extraocular movements intact.     Conjunctiva/sclera: Conjunctivae normal.  Cardiovascular:     Rate and Rhythm: Normal rate and regular rhythm.     Heart sounds: Normal heart sounds.  Pulmonary:     Effort: Pulmonary effort is normal.     Breath sounds: Normal breath sounds.  Abdominal:     General: Bowel sounds are normal. There is no distension.     Palpations: Abdomen is soft.     Tenderness: There is no abdominal tenderness. There is no right CVA tenderness, left CVA tenderness or guarding.  Musculoskeletal:        General: Normal range of motion.     Cervical back: Normal range of motion and neck supple.  Skin:    General: Skin is warm and dry.  Neurological:     General: No focal deficit present.     Mental Status: He is oriented to person, place, and time.  Psychiatric:        Mood and Affect: Mood normal.        Thought Content: Thought content normal.        Judgment: Judgment normal.    UC Treatments / Results  Labs (all labs ordered are  listed, but only abnormal results are displayed) Labs Reviewed  POCT URINALYSIS DIP (MANUAL ENTRY) - Abnormal; Notable for the following components:      Result Value   Color, UA straw (*)    Bilirubin, UA large (*)    Protein Ur, POC =30 (*)    All other components within normal limits    EKG   Radiology No results found.  Procedures Procedures (including critical care time)  Medications Ordered in UC Medications - No data to display  Initial Impression / Assessment and Plan / UC Course  I have reviewed the triage vital signs and the nursing notes.  Pertinent labs & imaging results that were available during my care of the patient were reviewed by me and considered in my medical decision making (see chart for details).     Urinalysis today without evidence of kidney stone or urinary tract infection, exam very reassuring today with no obvious abnormalities.  Given his history of similar symptoms with prostatitis, will cover with Bactrim, push fluids and continue to monitor.  Follow-up with PCP as soon as possible for recheck and go to the emergency department for worsening symptoms.  Final Clinical Impressions(s) / UC Diagnoses   Final diagnoses:  Right flank pain  History of prostatitis     Discharge Instructions      I have sent over a course of broad-spectrum antibiotics to hopefully cover for a possible prostate infection.  If your symptoms  continue to worsen go to the emergency department for further evaluation.  Follow-up with your primary care provider as soon as possible or you may always come back here for a recheck    ED Prescriptions     Medication Sig Dispense Auth. Provider   sulfamethoxazole-trimethoprim (BACTRIM DS) 800-160 MG tablet Take 1 tablet by mouth 2 (two) times daily for 10 days. 20 tablet Volney American, [place]      PDMP not reviewed this encounter.   Volney American, [place] 10/25/22 1050

## 2022-11-10 ENCOUNTER — Ambulatory Visit (INDEPENDENT_AMBULATORY_CARE_PROVIDER_SITE_OTHER): Payer: No Typology Code available for payment source | Admitting: Podiatry

## 2022-11-10 DIAGNOSIS — Z91199 Patient's noncompliance with other medical treatment and regimen due to unspecified reason: Secondary | ICD-10-CM

## 2022-11-10 NOTE — Progress Notes (Signed)
Patient was no-show for appointment today 

## 2022-11-11 ENCOUNTER — Other Ambulatory Visit: Payer: Self-pay

## 2022-11-11 ENCOUNTER — Other Ambulatory Visit (HOSPITAL_COMMUNITY): Payer: Self-pay

## 2023-01-03 ENCOUNTER — Telehealth: Payer: Self-pay | Admitting: Family Medicine

## 2023-01-03 DIAGNOSIS — G2581 Restless legs syndrome: Secondary | ICD-10-CM

## 2023-01-03 NOTE — Telephone Encounter (Signed)
**  After Hours/ Emergency Line Call**  Netty Starring paged the after-hours emergency line requesting that 4 tablets of his ropinirole 2 mg be sent to [facility] at  [place], [place], [place] [address] (Phone number: [phone]).  He takes this for his restless leg syndrome.  He is currently out of town and forgot to bring his medication.  He is requesting #4 tablets to be sent.  Pharmacy is currently closed without option to leave voicemail to call it in, unable to send prescription electronically.  Will ask that the day team tomorrow send in prescription.  He is aware of this.  Will forward to PCP.   Sharion Settler, DO PGY-3, [facility] 01/03/2023 8:07 PM

## 2023-01-04 ENCOUNTER — Telehealth: Payer: Self-pay | Admitting: Student

## 2023-01-04 NOTE — Telephone Encounter (Signed)
Called patient and let him know I have spoken with pharmacy and verbally sent in Ropinorole 2 mg qhs (quantity 4 tablets) to pharmacy.

## 2023-01-05 ENCOUNTER — Encounter: Payer: Self-pay | Admitting: Family Medicine

## 2023-01-05 MED ORDER — ROPINIROLE HCL 2 MG PO TABS: ORAL_TABLET | Freq: Every day | ORAL | 0 refills | Status: DC

## 2023-01-05 NOTE — Addendum Note (Signed)
Addended by: Lissa Morales D on: 01/05/2023 04:37 PM   Modules accepted: Orders

## 2023-01-29 ENCOUNTER — Encounter: Payer: Self-pay | Admitting: Family Medicine

## 2023-02-09 ENCOUNTER — Other Ambulatory Visit: Payer: Self-pay | Admitting: Family Medicine

## 2023-02-09 ENCOUNTER — Other Ambulatory Visit (HOSPITAL_COMMUNITY): Payer: Self-pay

## 2023-02-09 DIAGNOSIS — G2581 Restless legs syndrome: Secondary | ICD-10-CM

## 2023-02-11 ENCOUNTER — Encounter: Payer: Self-pay | Admitting: Family Medicine

## 2023-02-11 ENCOUNTER — Other Ambulatory Visit (HOSPITAL_COMMUNITY): Payer: Self-pay

## 2023-02-11 ENCOUNTER — Other Ambulatory Visit: Payer: Self-pay

## 2023-02-11 MED ORDER — ROPINIROLE HCL 2 MG PO TABS
2.0000 mg | ORAL_TABLET | Freq: Every day | ORAL | 1 refills | Status: DC
  Filled 2023-02-11: qty 90, 90d supply, fill #0

## 2023-02-19 ENCOUNTER — Other Ambulatory Visit (HOSPITAL_COMMUNITY): Payer: Self-pay

## 2023-03-04 ENCOUNTER — Ambulatory Visit (INDEPENDENT_AMBULATORY_CARE_PROVIDER_SITE_OTHER): Payer: 59 | Admitting: Family Medicine

## 2023-03-04 ENCOUNTER — Encounter: Payer: Self-pay | Admitting: Family Medicine

## 2023-03-04 VITALS — BP 116/62 | HR 90 | Temp 98.2°F | Ht 69.0 in | Wt 190.8 lb

## 2023-03-04 DIAGNOSIS — Z125 Encounter for screening for malignant neoplasm of prostate: Secondary | ICD-10-CM | POA: Diagnosis not present

## 2023-03-04 DIAGNOSIS — R7401 Elevation of levels of liver transaminase levels: Secondary | ICD-10-CM

## 2023-03-04 DIAGNOSIS — G2581 Restless legs syndrome: Secondary | ICD-10-CM

## 2023-03-04 DIAGNOSIS — Z9884 Bariatric surgery status: Secondary | ICD-10-CM | POA: Diagnosis not present

## 2023-03-04 LAB — COMPREHENSIVE METABOLIC PANEL
ALT: 16 U/L (ref 0–53)
AST: 18 U/L (ref 0–37)
Albumin: 4.4 g/dL (ref 3.5–5.2)
Alkaline Phosphatase: 57 U/L (ref 39–117)
BUN: 16 mg/dL (ref 6–23)
CO2: 27 mEq/L (ref 19–32)
Calcium: 9.5 mg/dL (ref 8.4–10.5)
Chloride: 105 mEq/L (ref 96–112)
Creatinine, Ser: 0.83 mg/dL (ref 0.40–1.50)
GFR: 101.06 mL/min (ref >60.00)
Glucose, Bld: 92 mg/dL (ref 70–99)
Potassium: 4.6 mEq/L (ref 3.5–5.1)
Sodium: 143 mEq/L (ref 135–145)
Total Bilirubin: 0.7 mg/dL (ref 0.2–1.2)
Total Protein: 6.9 g/dL (ref 6.0–8.3)

## 2023-03-04 LAB — VITAMIN B12: Vitamin B-12: 554 pg/mL (ref 211–911)

## 2023-03-04 LAB — FOLATE: Folate: >23.8 ng/mL (ref >5.9)

## 2023-03-04 LAB — PSA: PSA: 1.14 ng/mL (ref 0.10–4.00)

## 2023-03-04 LAB — VITAMIN D 25 HYDROXY (VIT D DEFICIENCY, FRACTURES): VITD: 50.61 ng/mL (ref 30.00–100.00)

## 2023-03-04 NOTE — Assessment & Plan Note (Signed)
Weight is down 160 lbs. since his surgery. I do recommend he continue on vitamin supplementation. I will check some vitamin levels to make sure he is maintainign these.

## 2023-03-04 NOTE — Addendum Note (Signed)
Addended by: Trudee Kuster on: 03/04/2023 10:26 AM   Modules accepted: Orders

## 2023-03-04 NOTE — Progress Notes (Signed)
[facility] [address] [place] [address] Dept: [phone] Dept Fax: [phone]  Transfer of Care Office Visit  Subjective:    Patient ID: Jonathan Rivas, male    DOB: [birth year], 52 y.o..   MRN: [id number]  Chief Complaint  Patient presents with   Establish Care    NP- establish care.  No concerns.     History of Present Illness:  Patient is in today to establish care. Jonathan Rivas was born in [place]. His mother died when he was about a year old. His father died when he was 60 years old. He and his brother were raised by a paternal aunt. she was in the [employer]. As such, they moved around quite a bit when he was growing up. He lived in [place] for about 10 years. His aunt was an alcoholic and a toxic parent. The eventually ended up in [place], [place] (near [place]. [place]). Jonathan Rivas later attended [employer], receiving an associates degree in accounting. He then attended [employer], receiving his bachelors degree in business administration.Jonathan Rivas was previously married for 20 years, divorced, and now is remarried to his husband for 5 months. He has no children. He does smoke 1/2 ppd, having made multiple attempts to quit. He was able to be abstinent for 9 mo. at one point. He drinks alcohol on a rare occasion. He denies drug use.  Jonathan Rivas had a gastric bypass surgery in 2021. He notes his maximum weight was ~ 350 lbs. He is taking a daily multivitamin and calcium supplement.  Jonathan Rivas has a past history of severe depression and prior suicidality. He has been off of medication over the past year and feels he is doing well at this point. He feels coming to terms with his sexuality and moving past some of his traumatic ACEs has helped him overcome much of his depression issues.  Jonathan Rivas has a history of restless legs. He is on ropinirole 2 mg at bedtime. He does find this to be  effective, but notes concern about this causing him to be somnolent. He wonders about alternatives.  Past Medical History: Patient Active Problem List   Diagnosis Date Noted   Allergic rhinitis    S/P laparoscopic sleeve gastrectomy 11/28/2019   Major depression in complete remission 04/10/2015   Restless leg syndrome, controlled 04/10/2015   Past Surgical History:  Procedure Laterality Date   BUNIONECTOMY Right 2023   CARDIAC CATHETERIZATION N/A 10/03/2016   No CAD: Procedure: Left Heart Cath and Coronary Angiography;  Surgeon: Lennette Bihari, MD;  [facility];  Service: Cardiovascular;  Laterality: N/A;   CHOLECYSTECTOMY N/A 09/23/2022   Procedure: LAPAROSCOPIC CHOLECYSTECTOMY WITH INTRAOPERATIVE CHOLANGIOGRAM;  Surgeon: Quentin Ore, MD;  Location: [facility];  Service: General;  Laterality: N/A;   COLONOSCOPY  06/07/2021   CYSTOSCOPY     Hematuria workup.  No recalled significant findings   LAPAROSCOPIC GASTRIC SLEEVE RESECTION N/A 11/28/2019   Procedure: LAPAROSCOPIC GASTRIC SLEEVE RESECTION, UPPER Endo, Eras Pathway;  Surgeon: Luretha Murphy, MD;  Location: [facility];  Service: General;  Laterality: N/A;   wisdom teeth     Family History  Problem Relation Age of Onset   Cancer Mother        Leukemia   Heart disease Father        Age 4's   Depression Brother    Alcohol abuse Brother    Drug abuse Brother  Asthma Maternal Aunt    Alcohol abuse Maternal Aunt    Heart disease Paternal Uncle    Heart disease Paternal Grandfather    Colon cancer Neg Hx    Colon polyps Neg Hx    Esophageal cancer Neg Hx    Stomach cancer Neg Hx    Rectal cancer Neg Hx    Outpatient Medications Prior to Visit  Medication Sig Dispense Refill   Calcium 500-2.5 MG-MCG CHEW Chew 1 tablet by mouth in the morning, at noon, and at bedtime.     loratadine (CLARITIN) 10 MG tablet Take 10 mg by mouth at bedtime.     Multiple Vitamins-Minerals (BARIATRIC MULTIVITAMINS/IRON)  CAPS Take 1 capsule by mouth daily.     rOPINIRole (REQUIP) 2 MG tablet Take 1 tablet (2 mg total) by mouth at bedtime. 90 tablet 1   No facility-administered medications prior to visit.   No Known Allergies    Objective:   Today's Vitals   03/04/23 0808  BP: 116/62  Pulse: 90  Temp: 98.2 F (36.8 C)  TempSrc: Temporal  SpO2: 98%  Weight: 190 lb 12.8 oz (86.5 kg)  Height: 5\' 9"  (1.753 m)   Body mass index is 28.18 kg/m.   General: Well developed, well nourished. No acute distress. Psych: Alert and oriented. Normal mood and affect.  There are no preventive care reminders to display for this patient.    Assessment & Plan:   Problem List Items Addressed This Visit       Other   Restless leg syndrome, controlled    I will check his iron levels, as low iron could be a cause of restless legs. I suggest  he try splitting his ropinirole, taking 1 mg before bedtime and see if this reduces some of the excess somnolence, but also controls the RLS.      Relevant Orders   Iron, TIBC and Ferritin Panel   S/P laparoscopic sleeve gastrectomy - Primary    Weight is down 160 lbs. since his surgery. I do recommend he continue on vitamin supplementation. I will check some vitamin levels to make sure he is maintainign these.      Relevant Orders   VITAMIN D 25 Hydroxy (Vit-D Deficiency, Fractures)   Vitamin B12   Iron, TIBC and Ferritin Panel   Folate   Other Visit Diagnoses     Elevated transaminase level       Noted previously at time of acute cholecystitis. I will repeat to make sure these have normalized.   Relevant Orders   Comprehensive metabolic panel   Screening for prostate cancer       Relevant Orders   PSA       Return in about 4 weeks (around 04/01/2023) for Reassessment.   Loyola Mast, MD

## 2023-03-04 NOTE — Assessment & Plan Note (Signed)
I will check his iron levels, as low iron could be a cause of restless legs. I suggest  he try splitting his ropinirole, taking 1 mg before bedtime and see if this reduces some of the excess somnolence, but also controls the RLS.

## 2023-03-05 LAB — IRON,TIBC AND FERRITIN PANEL
%SAT: 46 % (calc) (ref 20–48)
Ferritin: 50 ng/mL (ref 38–380)
Iron: 183 ug/dL — ABNORMAL HIGH (ref 50–180)
TIBC: 394 mcg/dL (calc) (ref 250–425)

## 2023-04-07 ENCOUNTER — Encounter: Payer: Self-pay | Admitting: Family Medicine

## 2023-04-07 ENCOUNTER — Ambulatory Visit (INDEPENDENT_AMBULATORY_CARE_PROVIDER_SITE_OTHER): Payer: 59 | Admitting: Family Medicine

## 2023-04-07 ENCOUNTER — Other Ambulatory Visit (HOSPITAL_COMMUNITY): Payer: Self-pay

## 2023-04-07 VITALS — BP 124/80 | HR 71 | Temp 98.7°F | Ht 69.0 in | Wt 191.6 lb

## 2023-04-07 DIAGNOSIS — G2581 Restless legs syndrome: Secondary | ICD-10-CM

## 2023-04-07 DIAGNOSIS — Z9884 Bariatric surgery status: Secondary | ICD-10-CM | POA: Diagnosis not present

## 2023-04-07 MED ORDER — GABAPENTIN 100 MG PO CAPS
100.0000 mg | ORAL_CAPSULE | Freq: Every day | ORAL | 3 refills | Status: DC
Start: 2023-04-07 — End: 2023-05-01
  Filled 2023-04-07: qty 30, 30d supply, fill #0

## 2023-04-07 NOTE — Progress Notes (Signed)
[facility] [address] [place] [address] Dept: [phone] Dept Fax: [phone]  Chronic Care Office Visit  Subjective:    Patient ID: Jonathan Rivas, male    DOB: 01-25-1971, 52 y.o..   MRN: [id number]  Chief Complaint  Patient presents with   Medical Management of Chronic Issues    F/u, no concerns.     History of Present Illness:  Patient is in today for reassessment of chronic medical issues.  Mr. Kolden has a history of restless legs. He had been on ropinirole 2 mg at bedtime. He found this to be effective, but noted concern about this causing him to be somnolent. I had him reduce his dose to 1 mg. He found this caused less somnolence, but was also slower to take effect and help him sleep.  Mr. Kimble had a gastric bypass surgery in 2021. He notes his maximum weight was ~ 350 lbs. He is taking a daily multivitamin and calcium supplement   Past Medical History: Patient Active Problem List   Diagnosis Date Noted   Allergic rhinitis    S/P laparoscopic sleeve gastrectomy 11/28/2019   Major depression in complete remission ([facility]) 04/10/2015   Restless leg syndrome 04/10/2015   Tobacco use disorder 04/10/2015   Past Surgical History:  Procedure Laterality Date   BUNIONECTOMY Right 2023   CARDIAC CATHETERIZATION N/A 10/03/2016   No CAD: Procedure: Left Heart Cath and Coronary Angiography;  Surgeon: Lennette Bihari, MD;  [facility];  Service: Cardiovascular;  Laterality: N/A;   CHOLECYSTECTOMY N/A 09/23/2022   Procedure: LAPAROSCOPIC CHOLECYSTECTOMY WITH INTRAOPERATIVE CHOLANGIOGRAM;  Surgeon: Quentin Ore, MD;  Location: [facility];  Service: General;  Laterality: N/A;   COLONOSCOPY  06/07/2021   CYSTOSCOPY     Hematuria workup.  No recalled significant findings   LAPAROSCOPIC GASTRIC SLEEVE RESECTION N/A 11/28/2019   Procedure: LAPAROSCOPIC GASTRIC SLEEVE RESECTION, UPPER Endo, Eras  Pathway;  Surgeon: Luretha Murphy, MD;  Location: [facility];  Service: General;  Laterality: N/A;   wisdom teeth     Family History  Problem Relation Age of Onset   Cancer Mother        Leukemia   Heart disease Father        Age 55's   Depression Brother    Alcohol abuse Brother    Drug abuse Brother    Asthma Maternal Aunt    Alcohol abuse Maternal Aunt    Heart disease Paternal Uncle    Heart disease Paternal Grandfather    Colon cancer Neg Hx    Colon polyps Neg Hx    Esophageal cancer Neg Hx    Stomach cancer Neg Hx    Rectal cancer Neg Hx    Outpatient Medications Prior to Visit  Medication Sig Dispense Refill   Calcium 500-2.5 MG-MCG CHEW Chew 1 tablet by mouth in the morning, at noon, and at bedtime.     loratadine (CLARITIN) 10 MG tablet Take 10 mg by mouth at bedtime.     Multiple Vitamins-Minerals (BARIATRIC MULTIVITAMINS/IRON) CAPS Take 1 capsule by mouth daily.     rOPINIRole (REQUIP) 2 MG tablet Take 1 tablet (2 mg total) by mouth at bedtime. 90 tablet 1   No facility-administered medications prior to visit.   No Known Allergies Objective:   Today's Vitals   04/07/23 1258  BP: 124/80  Pulse: 71  Temp: 98.7 F (37.1 C)  TempSrc: Temporal  SpO2: 98%  Weight: 191  lb 9.6 oz (86.9 kg)  Height: 5\' 9"  (1.753 m)   Body mass index is 28.29 kg/m.   General: Well developed, well nourished. No acute distress. Psych: Alert and oriented. Normal mood and affect.  There are no preventive care reminders to display for this patient.    Lab Results: Last vitamin D Lab Results  Component Value Date   VD25OH 50.61 03/04/2023   Lab Results  Component Value Date   VITAMINB12 554 03/04/2023      Latest Ref Rng & Units 03/04/2023    9:11 AM 09/23/2022    4:02 AM 09/22/2022   11:52 AM  CMP  Glucose 70 - 99 mg/dL 92  85  161   BUN 6 - 23 mg/dL 16  8  10    Creatinine 0.40 - 1.50 mg/dL 0.96  0.45  4.09   Sodium 135 - 145 mEq/L 143  141  136   Potassium 3.5 - 5.1  mEq/L 4.6  3.9  5.2   Chloride 96 - 112 mEq/L 105  108  104   CO2 19 - 32 mEq/L 27  25  27    Calcium 8.4 - 10.5 mg/dL 9.5  8.5  9.1   Total Protein 6.0 - 8.3 g/dL 6.9  6.2  7.4   Total Bilirubin 0.2 - 1.2 mg/dL 0.7  2.1  2.5   Alkaline Phos 39 - 117 U/L 57  72  72   AST 0 - 37 U/L 18  91  152   ALT 0 - 53 U/L 16  124  127    Lab Results  Component Value Date   PSA 1.14 03/04/2023   PSA 1.13 05/01/2015   Iron/TIBC/Ferritin/ %Sat    Component Value Date/Time   IRON 183 (H) 03/04/2023 0911   TIBC 394 03/04/2023 0911   FERRITIN 50 03/04/2023 0911   FERRITIN 179 03/12/2022 1126   IRONPCTSAT 46 03/04/2023 0911   Assessment & Plan:   Problem List Items Addressed This Visit       Other   Restless leg syndrome - Primary    Iron levels normal. I recommend we give a trial of gabapentin 100 mg qhs. If not controlling leg movements after 2 weeks, he may increase this to 200 mg qhs. I will see him back in 6 weeks.      Relevant Medications   gabapentin (NEURONTIN) 100 MG capsule   S/P laparoscopic sleeve gastrectomy    Lab results are all normal. I recommend he continue his daily supplements.       Return in about 6 weeks (around 05/19/2023) for Reassessment.   Loyola Mast, MD

## 2023-04-07 NOTE — Assessment & Plan Note (Signed)
Iron levels normal. I recommend we give a trial of gabapentin 100 mg qhs. If not controlling leg movements after 2 weeks, he may increase this to 200 mg qhs. I will see him back in 6 weeks.

## 2023-04-07 NOTE — Assessment & Plan Note (Signed)
Lab results are all normal. I recommend he continue his daily supplements.

## 2023-04-30 ENCOUNTER — Encounter: Payer: Self-pay | Admitting: Family Medicine

## 2023-04-30 ENCOUNTER — Other Ambulatory Visit (HOSPITAL_COMMUNITY): Payer: Self-pay

## 2023-04-30 DIAGNOSIS — G2581 Restless legs syndrome: Secondary | ICD-10-CM

## 2023-05-01 ENCOUNTER — Encounter: Payer: Self-pay | Admitting: Pharmacist

## 2023-05-01 ENCOUNTER — Other Ambulatory Visit (HOSPITAL_COMMUNITY): Payer: Self-pay

## 2023-05-01 ENCOUNTER — Other Ambulatory Visit: Payer: Self-pay

## 2023-05-01 MED ORDER — ROPINIROLE HCL 2 MG PO TABS
2.0000 mg | ORAL_TABLET | Freq: Every day | ORAL | 3 refills | Status: DC
Start: 2023-05-01 — End: 2024-03-27
  Filled 2023-05-01: qty 90, 90d supply, fill #0
  Filled 2023-07-21: qty 90, 90d supply, fill #1
  Filled 2023-10-11: qty 90, 90d supply, fill #2
  Filled 2024-01-04: qty 90, 90d supply, fill #3

## 2023-05-04 ENCOUNTER — Other Ambulatory Visit (HOSPITAL_COMMUNITY): Payer: Self-pay

## 2023-05-08 ENCOUNTER — Other Ambulatory Visit (HOSPITAL_COMMUNITY): Payer: Self-pay

## 2023-06-03 ENCOUNTER — Other Ambulatory Visit: Payer: Self-pay | Admitting: Oncology

## 2023-06-03 DIAGNOSIS — Z006 Encounter for examination for normal comparison and control in clinical research program: Secondary | ICD-10-CM

## 2023-06-11 ENCOUNTER — Other Ambulatory Visit: Payer: Self-pay

## 2023-06-11 ENCOUNTER — Emergency Department (HOSPITAL_COMMUNITY): Payer: 59

## 2023-06-11 ENCOUNTER — Telehealth: Payer: 59 | Admitting: Physician Assistant

## 2023-06-11 ENCOUNTER — Encounter (HOSPITAL_COMMUNITY): Payer: Self-pay | Admitting: *Deleted

## 2023-06-11 ENCOUNTER — Observation Stay (HOSPITAL_COMMUNITY)
Admission: EM | Admit: 2023-06-11 | Discharge: 2023-06-12 | Disposition: A | Discharge status: Home / Self Care | Payer: 59 | Attending: Internal Medicine | Admitting: Internal Medicine

## 2023-06-11 DIAGNOSIS — F17210 Nicotine dependence, cigarettes, uncomplicated: Secondary | ICD-10-CM | POA: Diagnosis not present

## 2023-06-11 DIAGNOSIS — K0889 Other specified disorders of teeth and supporting structures: Secondary | ICD-10-CM

## 2023-06-11 DIAGNOSIS — Z79899 Other long term (current) drug therapy: Secondary | ICD-10-CM | POA: Insufficient documentation

## 2023-06-11 DIAGNOSIS — R7401 Elevation of levels of liver transaminase levels: Secondary | ICD-10-CM | POA: Diagnosis not present

## 2023-06-11 DIAGNOSIS — R55 Syncope and collapse: Secondary | ICD-10-CM | POA: Diagnosis not present

## 2023-06-11 DIAGNOSIS — G2581 Restless legs syndrome: Secondary | ICD-10-CM | POA: Diagnosis not present

## 2023-06-11 DIAGNOSIS — D72829 Elevated white blood cell count, unspecified: Secondary | ICD-10-CM | POA: Insufficient documentation

## 2023-06-11 DIAGNOSIS — Z9889 Other specified postprocedural states: Secondary | ICD-10-CM

## 2023-06-11 DIAGNOSIS — R7309 Other abnormal glucose: Secondary | ICD-10-CM

## 2023-06-11 LAB — COMPREHENSIVE METABOLIC PANEL
ALT: 61 U/L — ABNORMAL HIGH (ref 0–44)
AST: 71 U/L — ABNORMAL HIGH (ref 15–41)
Albumin: 3.7 g/dL (ref 3.5–5.0)
Alkaline Phosphatase: 81 U/L (ref 38–126)
Anion gap: 14 (ref 5–15)
BUN: 12 mg/dL (ref 6–20)
CO2: 25 mmol/L (ref 22–32)
Calcium: 8.8 mg/dL — ABNORMAL LOW (ref 8.9–10.3)
Chloride: 105 mmol/L (ref 98–111)
Creatinine, Ser: 0.85 mg/dL (ref 0.61–1.24)
GFR, Estimated: >60 mL/min (ref >60)
Glucose, Bld: 108 mg/dL — ABNORMAL HIGH (ref 70–99)
Potassium: 3.9 mmol/L (ref 3.5–5.1)
Sodium: 144 mmol/L (ref 135–145)
Total Bilirubin: 0.4 mg/dL (ref 0.3–1.2)
Total Protein: 6.5 g/dL (ref 6.5–8.1)

## 2023-06-11 LAB — CBC WITH DIFFERENTIAL/PLATELET
Abs Immature Granulocytes: 0.06 10*3/uL (ref 0.00–0.07)
Basophils Absolute: 0 10*3/uL (ref 0.0–0.1)
Basophils Relative: 0 %
Eosinophils Absolute: 0.1 10*3/uL (ref 0.0–0.5)
Eosinophils Relative: 0 %
HCT: 41.6 % (ref 39.0–52.0)
Hemoglobin: 13.7 g/dL (ref 13.0–17.0)
Immature Granulocytes: 0 %
Lymphocytes Relative: 12 %
Lymphs Abs: 1.8 10*3/uL (ref 0.7–4.0)
MCH: 31.1 pg (ref 26.0–34.0)
MCHC: 32.9 g/dL (ref 30.0–36.0)
MCV: 94.5 fL (ref 80.0–100.0)
Monocytes Absolute: 1.1 10*3/uL — ABNORMAL HIGH (ref 0.1–1.0)
Monocytes Relative: 8 %
Neutro Abs: 11.1 10*3/uL — ABNORMAL HIGH (ref 1.7–7.7)
Neutrophils Relative %: 80 %
Platelets: 190 10*3/uL (ref 150–400)
RBC: 4.4 MIL/uL (ref 4.22–5.81)
RDW: 13.9 % (ref 11.5–15.5)
WBC: 14.1 10*3/uL — ABNORMAL HIGH (ref 4.0–10.5)
nRBC: 0 % (ref 0.0–0.2)

## 2023-06-11 LAB — MAGNESIUM: Magnesium: 2 mg/dL (ref 1.7–2.4)

## 2023-06-11 LAB — TROPONIN I (HIGH SENSITIVITY): Troponin I (High Sensitivity): 5 ng/L (ref <18)

## 2023-06-11 MED ORDER — LACTATED RINGERS IV BOLUS
1000.0000 mL | Freq: Once | INTRAVENOUS | Status: AC
  Administered 2023-06-11: 1000 mL via INTRAVENOUS

## 2023-06-11 NOTE — ED Triage Notes (Signed)
Pt arrived with EMS for multiple syncopal episodes today. Had a root canal on Tuesday and has had ongoing pain from procedure. Was called in tylenol 3s today (first doses at 1530) and started PO antibiotic. Reports chills earlier today also. C/o left buttock pain and has abrasion to L knee from first syncopal episode this morning. Son called EMS after pt had 2 more episodes. Bp noted at low 100s systolic on ems arrival with EKG irregularities. NS given enroute

## 2023-06-11 NOTE — Progress Notes (Signed)
Virtual Visit Consent   Jonathan Rivas, you are scheduled for a virtual visit with a [facility] provider today. Just as with appointments in the office, your consent must be obtained to participate. Your consent will be active for this visit and any virtual visit you may have with one of our providers in the next 365 days. If you have a MyChart account, a copy of this consent can be sent to you electronically.  As this is a virtual visit, video technology does not allow for your provider to perform a traditional examination. This may limit your provider's ability to fully assess your condition. If your provider identifies any concerns that need to be evaluated in person or the need to arrange testing (such as labs, EKG, etc.), we will make arrangements to do so. Although advances in technology are sophisticated, we cannot ensure that it will always work on either your end or our end. If the connection with a video visit is poor, the visit may have to be switched to a telephone visit. With either a video or telephone visit, we are not always able to ensure that we have a secure connection.  By engaging in this virtual visit, you consent to the provision of healthcare and authorize for your insurance to be billed (if applicable) for the services provided during this visit. Depending on your insurance coverage, you may receive a charge related to this service.  I need to obtain your verbal consent now. Are you willing to proceed with your visit today? [name] has provided verbal consent on 06/11/2023 for a virtual visit (video or telephone). [name], PA-C  Date: 06/11/2023 9:47 AM  Virtual Visit via Video Note   I, [name], connected with  Jonathan Rivas  ([id number], September 15, 1971) on 06/11/23 at  9:45 AM EDT by a video-enabled telemedicine application and verified that I am speaking with the correct person using two identifiers.  Location: Patient: Virtual Visit  Location Patient: Home Provider: Virtual Visit Location Provider: Home Office   I discussed the limitations of evaluation and management by telemedicine and the availability of in person appointments. The patient expressed understanding and agreed to proceed.    History of Present Illness: Jonathan Rivas is a 52 y.o. who identifies as a male who was assigned male at birth, and is being seen today for dental pain.  HPI: Dental Pain  This is a new problem. Episode onset: had a root canal on Tuesday, 06/09/23; since have developed increased pain in area where the injected anesthetic. The problem occurs constantly. The problem has been unchanged. The pain is moderate. Associated symptoms include facial pain, sinus pressure and thermal sensitivity. Pertinent negatives include no difficulty swallowing, fever or oral bleeding. He has tried acetaminophen and NSAIDs for the symptoms. The treatment provided no relief.      Problems:  Patient Active Problem List   Diagnosis Date Noted   Allergic rhinitis    S/P laparoscopic sleeve gastrectomy 11/28/2019   Major depression in complete remission (HCC) 04/10/2015   Restless leg syndrome 04/10/2015   Tobacco use disorder 04/10/2015    Allergies: No Known Allergies Medications:  Current Outpatient Medications:    Calcium 500-2.5 MG-MCG CHEW, Chew 1 tablet by mouth in the morning, at noon, and at bedtime., Disp: , Rfl:    loratadine (CLARITIN) 10 MG tablet, Take 10 mg by mouth at bedtime., Disp: , Rfl:    Multiple Vitamins-Minerals (BARIATRIC MULTIVITAMINS/IRON) CAPS, Take 1 capsule  by mouth daily., Disp: , Rfl:    rOPINIRole (REQUIP) 2 MG tablet, Take 1 tablet (2 mg total) by mouth at bedtime., Disp: 90 tablet, Rfl: 3  Observations/Objective: Patient is well-developed, well-nourished in no acute distress.  Resting comfortably at home.  Head is normocephalic, atraumatic.  No labored breathing.  Speech is clear and coherent with logical content.   Patient is alert and oriented at baseline.    Assessment and Plan: 1. Pain, dental  2. History of root canal procedure  - Since having recent dental procedure I have advised for him to keep scheduled appt with dentist that performed procedure that is today at 2pm  Follow Up Instructions: I discussed the assessment and treatment plan with the patient. The patient was provided an opportunity to ask questions and all were answered. The patient agreed with the plan and demonstrated an understanding of the instructions.  A copy of instructions were sent to the patient via MyChart unless otherwise noted below.    The patient was advised to call back or seek an in-person evaluation if the symptoms worsen or if the condition fails to improve as anticipated.  Time:  I spent 8 minutes with the patient via telehealth technology discussing the above problems/concerns.    [name], PA-C

## 2023-06-11 NOTE — Patient Instructions (Signed)
  [name], thank you for joining [name], PA-C for today's virtual visit.  While this provider is not your primary care provider (PCP), if your PCP is located in our provider database this encounter information will be shared with them immediately following your visit.   A [facility] MyChart account gives you access to today's visit and all your visits, tests, and labs performed at [facility] " click here if you don't have a [facility] MyChart account or go to [email or web address]  Consent: (Patient) Jonathan Rivas provided verbal consent for this virtual visit at the beginning of the encounter.  Current Medications:  Current Outpatient Medications:    Calcium 500-2.5 MG-MCG CHEW, Chew 1 tablet by mouth in the morning, at noon, and at bedtime., Disp: , Rfl:    loratadine (CLARITIN) 10 MG tablet, Take 10 mg by mouth at bedtime., Disp: , Rfl:    Multiple Vitamins-Minerals (BARIATRIC MULTIVITAMINS/IRON) CAPS, Take 1 capsule by mouth daily., Disp: , Rfl:    rOPINIRole (REQUIP) 2 MG tablet, Take 1 tablet (2 mg total) by mouth at bedtime., Disp: 90 tablet, Rfl: 3   Medications ordered in this encounter:  No orders of the defined types were placed in this encounter.    *If you need refills on other medications prior to your next appointment, please contact your pharmacy*  Follow-Up: Call back or seek an in-person evaluation if the symptoms worsen or if the condition fails to improve as anticipated.  [facility] [phone]  Other Instructions Contact dentist that performed procedure and schedule appt for post procedure complications   If you have been instructed to have an in-person evaluation today at a local Urgent Care facility, please use the link below. It will take you to a list of all of our available [facility], including address, phone number and hours of operation. Please do not delay care.  [facility]  [facility]  If you or a family member do not have a primary care provider, use the link below to schedule a visit and establish care. When you choose a [facility] primary care physician or advanced practice provider, you gain a long-term partner in health. Find a Primary Care Provider  Learn more about [facility]'s in-office and virtual care options: [place] - Get Care Now

## 2023-06-11 NOTE — ED Provider Notes (Signed)
[facility] EMERGENCY DEPARTMENT AT [facility] Provider Note   CSN: [id number] Arrival date & time: 06/11/23  2030     History  Chief Complaint  Patient presents with   Loss of Consciousness    Jonathan Rivas is a 52 y.o. male.   Loss of Consciousness 52 year old male history of hyperlipidemia, depression presenting for syncope.  Patient is here with his husband.  Patient states on Tuesday he had a root canal.  He had some pain in his mouth from this and has been taking Tylenol 3 but otherwise has been doing okay.  However today he went outside and shortly after that became somewhat lightheaded.  He then remembers waking up on the ground and his knee was scraped.  Later in the day he was with his husband.  He had 2 episodes of syncope with his husband.  He did not have any preceding symptoms, but then lost consciousness for only a few seconds at a time.  No seizure-like activity or postictal period.  He did not fall again.  Currently he feels well, has not had any chest pain, shortness of breath, headache, weakness, numbness, vision changes.  He is not on anticoagulation.  He has been concern for possible infection from his tooth, but has not had any fevers.     Home Medications Prior to Admission medications   Medication Sig Start Date End Date Taking? Authorizing Provider  Calcium 500-2.5 MG-MCG CHEW Chew 1 tablet by mouth in the morning, at noon, and at bedtime.    [provider]  loratadine (CLARITIN) 10 MG tablet Take 10 mg by mouth at bedtime.    [provider]  Multiple Vitamins-Minerals (BARIATRIC MULTIVITAMINS/IRON) CAPS Take 1 capsule by mouth daily. 03/07/21   McDiarmid, Leighton Roach, MD  rOPINIRole (REQUIP) 2 MG tablet Take 1 tablet (2 mg total) by mouth at bedtime. 05/01/23   Loyola Mast, MD      Allergies    Nsaids    Review of Systems   Review of Systems  Cardiovascular:  Positive for syncope.  Review of systems completed and notable as  per HPI.  ROS otherwise negative.   Physical Exam Updated Vital Signs BP 120/68 (BP Location: Right Arm)   Pulse 75   Temp 98.9 F (37.2 C) (Oral)   Resp 16   Ht 5\' 9"  (1.753 m)   Wt 83.9 kg   SpO2 100%   BMI 27.32 kg/m  Physical Exam Vitals and nursing note reviewed.  Constitutional:      General: He is not in acute distress.    Appearance: He is well-developed.  HENT:     Head: Normocephalic and atraumatic.     Mouth/Throat:     Mouth: Mucous membranes are moist.     Pharynx: Oropharynx is clear. No oropharyngeal exudate or posterior oropharyngeal erythema.     Comments: No intraoral swelling, no trismus. Eyes:     Extraocular Movements: Extraocular movements intact.     Conjunctiva/sclera: Conjunctivae normal.     Pupils: Pupils are equal, round, and reactive to light.  Cardiovascular:     Rate and Rhythm: Normal rate and regular rhythm.     Heart sounds: No murmur heard. Pulmonary:     Effort: Pulmonary effort is normal. No respiratory distress.     Breath sounds: Normal breath sounds.  Abdominal:     Palpations: Abdomen is soft.     Tenderness: There is no abdominal tenderness.  Musculoskeletal:  General: No swelling.     Cervical back: Normal range of motion and neck supple. No rigidity or tenderness.     Right lower leg: No edema.     Left lower leg: No edema.  Skin:    General: Skin is warm and dry.     Capillary Refill: Capillary refill takes less than 2 seconds.  Neurological:     General: No focal deficit present.     Mental Status: He is alert and oriented to person, place, and time. Mental status is at baseline.     Cranial Nerves: No cranial nerve deficit.     Sensory: No sensory deficit.     Motor: No weakness.  Psychiatric:        Mood and Affect: Mood normal.     ED Results / Procedures / Treatments   Labs (all labs ordered are listed, but only abnormal results are displayed) Labs Reviewed  CBC WITH DIFFERENTIAL/PLATELET - Abnormal;  Notable for the following components:      Result Value   WBC 14.1 (*)    Neutro Abs 11.1 (*)    Monocytes Absolute 1.1 (*)    All other components within normal limits  COMPREHENSIVE METABOLIC PANEL - Abnormal; Notable for the following components:   Glucose, Bld 108 (*)    Calcium 8.8 (*)    AST 71 (*)    ALT 61 (*)    All other components within normal limits  MAGNESIUM  TROPONIN I (HIGH SENSITIVITY)  TROPONIN I (HIGH SENSITIVITY)    EKG EKG Interpretation Date/Time:  Thursday June 11 2023 20:35:36 EDT Ventricular Rate:  64 PR Interval:  180 QRS Duration:  86 QT Interval:  371 QTC Calculation: 383 R Axis:   15  Text Interpretation: Sinus rhythm Poor R wave progression Confirmed by Fulton Reek [phone]) on 06/11/2023 10:31:10 PM  Radiology CT Head Wo Contrast  Result Date: 06/11/2023 CLINICAL DATA:  Syncope EXAM: CT HEAD WITHOUT CONTRAST TECHNIQUE: Contiguous axial images were obtained from the base of the skull through the vertex without intravenous contrast. RADIATION DOSE REDUCTION: This exam was performed according to the departmental dose-optimization program which includes automated exposure control, adjustment of the mA and/or kV according to patient size and/or use of iterative reconstruction technique. COMPARISON:  None Available. FINDINGS: Brain: No evidence of acute infarction, hemorrhage, hydrocephalus, extra-axial collection or mass lesion/mass effect. Vascular: No hyperdense vessel or unexpected calcification. Skull: Normal. Negative for fracture or focal lesion. Sinuses/Orbits: No acute finding. Other: None IMPRESSION: Negative non contrasted CT appearance of the brain. Electronically Signed   By: Jasmine Pang M.D.   On: 06/11/2023 23:15   DG Chest 2 View  Result Date: 06/11/2023 CLINICAL DATA:  Recurrent syncope EXAM: CHEST - 2 VIEW COMPARISON:  07/08/2019 FINDINGS: Mild emphysematous changes are suggested in the lungs. Heart size and pulmonary vascularity are  normal. No airspace disease or consolidation. No pleural effusions. No pneumothorax. Mediastinal contours appear intact. IMPRESSION: No active cardiopulmonary disease. Electronically Signed   By: Burman Nieves M.D.   On: 06/11/2023 21:52    Procedures Procedures    Medications Ordered in ED Medications  lactated ringers bolus 1,000 mL (1,000 mLs Intravenous New Bag/Given 06/11/23 2155)    ED Course/ Medical Decision Making/ A&P                             Medical Decision Making Amount and/or Complexity of Data Reviewed Labs: ordered. Radiology:  ordered.   Medical Decision Making:   Jonathan Rivas is a 52 y.o. male who presented to the ED today with multiple episodes of syncope.  Vital signs reviewed.  On exam he is well-appearing, currently is asymptomatic.  Differential including ACS, arrhythmia, electro abnormality.  He also had recent root canal, however no signs of infection on exam, no fever or evidence of sepsis either.  Consider possible seizure, though no gross seizure-like activity and no postictal period seems less consistent with this.  I am concerned for possible intermittent arrhythmia.  He is no shortness of breath or hypoxia to suggest PE.  Given fall with possible head trauma will obtain CT head.  No signs of C-spine injury.   Additional history discussed with patient's family/caregivers.  Patient placed on continuous vitals and telemetry monitoring while in ED which was reviewed periodically.  Reviewed and confirmed nursing documentation for past medical history, family history, social history.  Initial Study Results:   Laboratory  All laboratory results reviewed.  Labs notable for mild leukocytosis, mild transaminitis, troponin negative initially.  EKG EKG was reviewed independently. Rate, rhythm, axis, intervals all examined and without medically relevant abnormality. ST segments without concerns for elevations.    Radiology:  All images reviewed  independently.  X-ray unremarkable.  CT head pending.  Agree with radiology report at this time.     Reassessment and Plan:   Patient remained stable throughout my shift.  CT head is pending.  I think he needs admission for recurrent syncope workup.  Handoff was given to Dr. Preston Fleeting at 11:30 PM with plan to follow-up CT and admit.   Patient's presentation is most consistent with acute presentation with potential threat to life or bodily function.           Final Clinical Impression(s) / ED Diagnoses Final diagnoses:  Syncope, unspecified syncope type    Rx / DC Orders ED Discharge Orders     None         Laurence Spates, MD 06/11/23 [phone]

## 2023-06-11 NOTE — ED Provider Notes (Signed)
Care assumed from Dr. Earlene Plater, patjent wit several episodes of syncope, CT of head is pending. Will need admission following that.  CT of head is normal.  I have independently viewed the images, and agree with radiologist's interpretation.  I have discussed the case with Dr. Janalyn Shy of [facility], who agrees to admit the patient.  Results for orders placed or performed during the hospital encounter of 06/11/23  CBC with Differential  Result Value Ref Range   WBC 14.1 (H) 4.0 - 10.5 K/uL   RBC 4.40 4.22 - 5.81 MIL/uL   Hemoglobin 13.7 13.0 - 17.0 g/dL   HCT 16.1 09.6 - 04.5 %   MCV 94.5 80.0 - 100.0 fL   MCH 31.1 26.0 - 34.0 pg   MCHC 32.9 30.0 - 36.0 g/dL   RDW 40.9 81.1 - 91.4 %   Platelets 190 150 - 400 K/uL   nRBC 0.0 0.0 - 0.2 %   Neutrophils Relative % 80 %   Neutro Abs 11.1 (H) 1.7 - 7.7 K/uL   Lymphocytes Relative 12 %   Lymphs Abs 1.8 0.7 - 4.0 K/uL   Monocytes Relative 8 %   Monocytes Absolute 1.1 (H) 0.1 - 1.0 K/uL   Eosinophils Relative 0 %   Eosinophils Absolute 0.1 0.0 - 0.5 K/uL   Basophils Relative 0 %   Basophils Absolute 0.0 0.0 - 0.1 K/uL   Immature Granulocytes 0 %   Abs Immature Granulocytes 0.06 0.00 - 0.07 K/uL  Comprehensive metabolic panel  Result Value Ref Range   Sodium 144 135 - 145 mmol/L   Potassium 3.9 3.5 - 5.1 mmol/L   Chloride 105 98 - 111 mmol/L   CO2 25 22 - 32 mmol/L   Glucose, Bld 108 (H) 70 - 99 mg/dL   BUN 12 6 - 20 mg/dL   Creatinine, Ser 7.82 0.61 - 1.24 mg/dL   Calcium 8.8 (L) 8.9 - 10.3 mg/dL   Total Protein 6.5 6.5 - 8.1 g/dL   Albumin 3.7 3.5 - 5.0 g/dL   AST 71 (H) 15 - 41 U/L   ALT 61 (H) 0 - 44 U/L   Alkaline Phosphatase 81 38 - 126 U/L   Total Bilirubin 0.4 0.3 - 1.2 mg/dL   GFR, Estimated >95 >62 mL/min   Anion gap 14 5 - 15  Magnesium  Result Value Ref Range   Magnesium 2.0 1.7 - 2.4 mg/dL  Troponin I (High Sensitivity)  Result Value Ref Range   Troponin I (High Sensitivity) 5 <18 ng/L   CT Head Wo  Contrast  Result Date: 06/11/2023 CLINICAL DATA:  Syncope EXAM: CT HEAD WITHOUT CONTRAST TECHNIQUE: Contiguous axial images were obtained from the base of the skull through the vertex without intravenous contrast. RADIATION DOSE REDUCTION: This exam was performed according to the departmental dose-optimization program which includes automated exposure control, adjustment of the mA and/or kV according to patient size and/or use of iterative reconstruction technique. COMPARISON:  None Available. FINDINGS: Brain: No evidence of acute infarction, hemorrhage, hydrocephalus, extra-axial collection or mass lesion/mass effect. Vascular: No hyperdense vessel or unexpected calcification. Skull: Normal. Negative for fracture or focal lesion. Sinuses/Orbits: No acute finding. Other: None IMPRESSION: Negative non contrasted CT appearance of the brain. Electronically Signed   By: Jasmine Pang M.D.   On: 06/11/2023 23:15   DG Chest 2 View  Result Date: 06/11/2023 CLINICAL DATA:  Recurrent syncope EXAM: CHEST - 2 VIEW COMPARISON:  07/08/2019 FINDINGS: Mild emphysematous changes are suggested in the lungs.  Heart size and pulmonary vascularity are normal. No airspace disease or consolidation. No pleural effusions. No pneumothorax. Mediastinal contours appear intact. IMPRESSION: No active cardiopulmonary disease. Electronically Signed   By: Burman Nieves M.D.   On: 06/11/2023 21:52      Dione Booze, MD 06/12/23 [phone]

## 2023-06-12 ENCOUNTER — Encounter (HOSPITAL_COMMUNITY): Payer: Self-pay | Admitting: Internal Medicine

## 2023-06-12 ENCOUNTER — Other Ambulatory Visit (HOSPITAL_COMMUNITY): Payer: Self-pay

## 2023-06-12 ENCOUNTER — Other Ambulatory Visit: Payer: 59

## 2023-06-12 ENCOUNTER — Other Ambulatory Visit: Payer: Self-pay

## 2023-06-12 ENCOUNTER — Observation Stay (HOSPITAL_BASED_OUTPATIENT_CLINIC_OR_DEPARTMENT_OTHER): Payer: 59

## 2023-06-12 ENCOUNTER — Encounter (HOSPITAL_COMMUNITY): Payer: Self-pay

## 2023-06-12 DIAGNOSIS — Z720 Tobacco use: Secondary | ICD-10-CM | POA: Diagnosis not present

## 2023-06-12 DIAGNOSIS — R55 Syncope and collapse: Principal | ICD-10-CM

## 2023-06-12 DIAGNOSIS — D72829 Elevated white blood cell count, unspecified: Secondary | ICD-10-CM

## 2023-06-12 DIAGNOSIS — G2581 Restless legs syndrome: Secondary | ICD-10-CM | POA: Diagnosis not present

## 2023-06-12 LAB — LIPID PANEL
Cholesterol: 147 mg/dL (ref 0–200)
HDL: 58 mg/dL (ref >40)
LDL Cholesterol: 76 mg/dL (ref 0–99)
Total CHOL/HDL Ratio: 2.5 RATIO
Triglycerides: 63 mg/dL (ref <150)
VLDL: 13 mg/dL (ref 0–40)

## 2023-06-12 LAB — CBC
HCT: 40.2 % (ref 39.0–52.0)
Hemoglobin: 13.1 g/dL (ref 13.0–17.0)
MCH: 30.6 pg (ref 26.0–34.0)
MCHC: 32.6 g/dL (ref 30.0–36.0)
MCV: 93.9 fL (ref 80.0–100.0)
Platelets: 181 10*3/uL (ref 150–400)
RBC: 4.28 MIL/uL (ref 4.22–5.81)
RDW: 14.1 % (ref 11.5–15.5)
WBC: 12.1 10*3/uL — ABNORMAL HIGH (ref 4.0–10.5)
nRBC: 0 % (ref 0.0–0.2)

## 2023-06-12 LAB — BASIC METABOLIC PANEL
Anion gap: 9 (ref 5–15)
BUN: 6 mg/dL (ref 6–20)
CO2: 25 mmol/L (ref 22–32)
Calcium: 8.4 mg/dL — ABNORMAL LOW (ref 8.9–10.3)
Chloride: 104 mmol/L (ref 98–111)
Creatinine, Ser: 0.77 mg/dL (ref 0.61–1.24)
GFR, Estimated: >60 mL/min (ref >60)
Glucose, Bld: 95 mg/dL (ref 70–99)
Potassium: 3.8 mmol/L (ref 3.5–5.1)
Sodium: 138 mmol/L (ref 135–145)

## 2023-06-12 LAB — ECHOCARDIOGRAM COMPLETE
AR max vel: 1.92 cm2
AV Peak grad: 12.3 mmHg
Ao pk vel: 1.75 m/s
Area-P 1/2: 3.91 cm2
Height: 69 in
S' Lateral: 3.7 cm
Weight: 2960 oz

## 2023-06-12 LAB — VITAMIN B12: Vitamin B-12: 478 pg/mL (ref 180–914)

## 2023-06-12 LAB — HEMOGLOBIN A1C
Hgb A1c MFr Bld: 5.4 % (ref 4.8–5.6)
Mean Plasma Glucose: 108.28 mg/dL

## 2023-06-12 LAB — TSH: TSH: 0.539 u[IU]/mL (ref 0.350–4.500)

## 2023-06-12 LAB — HIV ANTIBODY (ROUTINE TESTING W REFLEX): HIV Screen 4th Generation wRfx: NONREACTIVE

## 2023-06-12 LAB — TROPONIN I (HIGH SENSITIVITY): Troponin I (High Sensitivity): 4 ng/L (ref <18)

## 2023-06-12 LAB — FOLATE: Folate: 23.8 ng/mL (ref >5.9)

## 2023-06-12 MED ORDER — ENOXAPARIN SODIUM 40 MG/0.4ML IJ SOSY
40.0000 mg | PREFILLED_SYRINGE | Freq: Every day | INTRAMUSCULAR | Status: DC
  Administered 2023-06-12: 40 mg via SUBCUTANEOUS

## 2023-06-12 MED ORDER — METOPROLOL SUCCINATE ER 25 MG PO TB24
25.0000 mg | ORAL_TABLET | Freq: Every day | ORAL | Status: DC
  Administered 2023-06-12: 25 mg via ORAL

## 2023-06-12 MED ORDER — ONDANSETRON HCL 4 MG PO TABS: 4.0000 mg | ORAL_TABLET | Freq: Four times a day (QID) | ORAL | Status: DC | PRN

## 2023-06-12 MED ORDER — DIPHENHYDRAMINE HCL 25 MG PO CAPS
ORAL_CAPSULE | ORAL | Status: AC
  Filled 2023-06-12: qty 1

## 2023-06-12 MED ORDER — ONDANSETRON HCL 4 MG/2ML IJ SOLN: 4.0000 mg | Freq: Four times a day (QID) | INTRAMUSCULAR | Status: DC | PRN

## 2023-06-12 MED ORDER — MORPHINE SULFATE (PF) 4 MG/ML IV SOLN
4.0000 mg | Freq: Once | INTRAVENOUS | Status: AC
  Administered 2023-06-12: 4 mg via INTRAVENOUS
  Filled 2023-06-12: qty 1

## 2023-06-12 MED ORDER — MORPHINE SULFATE (PF) 2 MG/ML IV SOLN
INTRAVENOUS | Status: AC
  Filled 2023-06-12: qty 1

## 2023-06-12 MED ORDER — ACETAMINOPHEN 325 MG PO TABS: 650.0000 mg | ORAL_TABLET | Freq: Four times a day (QID) | ORAL | Status: DC | PRN

## 2023-06-12 MED ORDER — AMOXICILLIN-POT CLAVULANATE 875-125 MG PO TABS
ORAL_TABLET | ORAL | Status: AC
  Filled 2023-06-12: qty 1

## 2023-06-12 MED ORDER — SODIUM CHLORIDE 0.9% FLUSH: 3.0000 mL | INTRAVENOUS | Status: DC | PRN

## 2023-06-12 MED ORDER — SODIUM CHLORIDE 0.9% FLUSH
3.0000 mL | Freq: Two times a day (BID) | INTRAVENOUS | Status: DC
  Administered 2023-06-12 (×2): 3 mL via INTRAVENOUS

## 2023-06-12 MED ORDER — SODIUM CHLORIDE 0.9 % IV SOLN: 250.0000 mL | INTRAVENOUS | Status: DC | PRN

## 2023-06-12 MED ORDER — ROPINIROLE HCL 1 MG PO TABS
2.0000 mg | ORAL_TABLET | Freq: Every day | ORAL | Status: DC
  Administered 2023-06-12: 2 mg via ORAL

## 2023-06-12 MED ORDER — MORPHINE SULFATE (PF) 2 MG/ML IV SOLN
1.0000 mg | INTRAVENOUS | Status: DC | PRN
  Administered 2023-06-12: 1 mg via INTRAVENOUS

## 2023-06-12 MED ORDER — ACETAMINOPHEN-CODEINE 300-30 MG PO TABS
ORAL_TABLET | ORAL | Status: AC
  Filled 2023-06-12: qty 1

## 2023-06-12 MED ORDER — AMOXICILLIN-POT CLAVULANATE 875-125 MG PO TABS: 1.0000 | ORAL_TABLET | Freq: Two times a day (BID) | ORAL | Status: DC

## 2023-06-12 MED ORDER — ACETAMINOPHEN-CODEINE 300-30 MG PO TABS
1.0000 | ORAL_TABLET | Freq: Four times a day (QID) | ORAL | Status: DC | PRN
  Administered 2023-06-12: 1 via ORAL

## 2023-06-12 MED ORDER — ACETAMINOPHEN 650 MG RE SUPP: 650.0000 mg | Freq: Four times a day (QID) | RECTAL | Status: DC | PRN

## 2023-06-12 MED ORDER — AMOXICILLIN-POT CLAVULANATE 875-125 MG PO TABS
1.0000 | ORAL_TABLET | Freq: Two times a day (BID) | ORAL | Status: DC
  Administered 2023-06-12: 1 via ORAL

## 2023-06-12 MED ORDER — ENOXAPARIN SODIUM 40 MG/0.4ML IJ SOSY
PREFILLED_SYRINGE | INTRAMUSCULAR | Status: AC
  Filled 2023-06-12: qty 0.4

## 2023-06-12 MED ORDER — METOPROLOL SUCCINATE ER 25 MG PO TB24
25.0000 mg | ORAL_TABLET | Freq: Every day | ORAL | 1 refills | Status: DC
  Filled 2023-06-12: qty 30, 30d supply, fill #0
  Filled 2023-07-07: qty 30, 30d supply, fill #1

## 2023-06-12 MED ORDER — LORATADINE 10 MG PO TABS: 10.0000 mg | ORAL_TABLET | Freq: Every day | ORAL | Status: DC

## 2023-06-12 MED ORDER — HYDRALAZINE HCL 20 MG/ML IJ SOLN: 5.0000 mg | Freq: Four times a day (QID) | INTRAMUSCULAR | Status: DC | PRN

## 2023-06-12 MED ORDER — DIPHENHYDRAMINE HCL 25 MG PO CAPS
25.0000 mg | ORAL_CAPSULE | Freq: Four times a day (QID) | ORAL | Status: DC | PRN
  Administered 2023-06-12: 25 mg via ORAL

## 2023-06-12 MED ORDER — SODIUM CHLORIDE 0.9 % IV BOLUS
1000.0000 mL | Freq: Once | INTRAVENOUS | Status: AC
  Administered 2023-06-12: 1000 mL via INTRAVENOUS

## 2023-06-12 MED ORDER — ROPINIROLE HCL 1 MG PO TABS
ORAL_TABLET | ORAL | Status: AC
  Filled 2023-06-12: qty 2

## 2023-06-12 NOTE — Progress Notes (Signed)
Echocardiogram 2D Echocardiogram has been performed.  Jonathan Rivas 06/12/2023, 10:42 AM

## 2023-06-12 NOTE — Consult Note (Addendum)
CARDIOLOGY CONSULT NOTE  Patient ID: Jonathan Rivas MRN: [id number] DOB/AGE: 01/26/1971 52 y.o.  Admit date: 06/11/2023 Referring Physician  Kathlen Mody, MD Primary Physician:  Loyola Mast, MD Reason for Consultation  Syncope  Patient ID: Jonathan Rivas, male    DOB: Dec 07, 1970, 52 y.o.   MRN: [id number]  Chief Complaint  Patient presents with   Loss of Consciousness   HPI:    Jonathan Rivas  is a 52 y.o. Caucasian male patient with no significant prior cardiovascular history admitted to the hospital with syncope.  Patient had first episode of syncope about 4 days ago, patient had root canal treatment done and was walking in the backyard suddenly felt dizzy and diaphoretic and had syncopal episode with minor injury.  He was started on antibiotic the following day for jaw swelling, 2 days later that is yesterday he got up from the bed and went to the bathroom and had syncope, patient does not recollect premonitory symptoms.  His husband heard the noise and went to the bathroom and made him sit up, had another episode of syncope when he sat down and hence EMS was activated.  Upon EMS presentation, patient was active and alert and still laying on the floor, they made him sit against the wall.  While they were discussing next options, patient had another episode of syncope with seizure but no incontinence.  He was transported to the emergency room.  Presently asymptomatic.  Husband present at the bedside.  There is no family history of sudden cardiac death of unknown etiology.  There is questionable history of some cardiac abnormality and/or valvular abnormality and his father who died very young at the age of 79.  His brother also died at a very young age SEPSIS and possibly affecting heart.  No other unexplained sudden cardiac death.  Patient exercises regularly, goes for walks and also sometimes hikes.  He does smoke cigarettes.  Does not drink heavy alcohol, drinks  occasionally.   Past Medical History:  Diagnosis Date   Allergic rhinitis    Allergy    Arthritis    At risk for diabetes mellitus 10/28/2018   Blood transfusion without reported diagnosis    with bariatric surgery   Depression    Erectile dysfunction    Erectile dysfunction 04/10/2015   Hyperlipidemia 06/22/2019   Insomnia 12/28/2017   Morbid obesity (HCC) 04/10/2015   Numbness and tingling in both hands 09/25/2016   Obesity, morbid, BMI 40.0-49.9 (HCC)    Palpitations 09/25/2016   Pure hypercholesterolemia 06/22/2019   ASCVD 10-yr risk ~ 3% (2021)   Syncope and collapse 06/06/2022   Tobacco use    Tubular adenoma of colon 06/14/2021   Tubular adenoma colon x 1 (< 1 cm)   Urinary incontinence, post-void dribbling 10/29/2018   Past Surgical History:  Procedure Laterality Date   BUNIONECTOMY Right 2023   CARDIAC CATHETERIZATION N/A 10/03/2016   No CAD: Procedure: Left Heart Cath and Coronary Angiography;  Surgeon: Lennette Bihari, MD;  Location: [facility];  Service: Cardiovascular;  Laterality: N/A;   CHOLECYSTECTOMY N/A 09/23/2022   Procedure: LAPAROSCOPIC CHOLECYSTECTOMY WITH INTRAOPERATIVE CHOLANGIOGRAM;  Surgeon: Quentin Ore, MD;  Location: [facility];  Service: General;  Laterality: N/A;   COLONOSCOPY  06/07/2021   CYSTOSCOPY     Hematuria workup.  No recalled significant findings   LAPAROSCOPIC GASTRIC SLEEVE RESECTION N/A 11/28/2019   Procedure: LAPAROSCOPIC GASTRIC SLEEVE RESECTION, UPPER Endo, Eras Pathway;  Surgeon: Luretha Murphy, MD;  Location: [facility];  Service: General;  Laterality: N/A;   wisdom teeth     Social History   Tobacco Use   Smoking status: Every Day    Current packs/day: 0.00    Average packs/day: 0.5 packs/day for 30.0 years (15.0 ttl pk-yrs)    Types: Cigarettes    Start date: 04/27/1988    Last attempt to quit: 04/27/2018    Years since quitting: 5.1   Smokeless tobacco: Never   Tobacco comments:    Have quit in the past for  short periods of time.  Substance Use Topics   Alcohol use: Not Currently    Family History  Problem Relation Age of Onset   Cancer Mother        Leukemia   Heart disease Father        Age 33's   Depression Brother    Alcohol abuse Brother    Drug abuse Brother    Asthma Maternal Aunt    Alcohol abuse Maternal Aunt    Heart disease Paternal Uncle    Heart disease Paternal Grandfather    Colon cancer Neg Hx    Colon polyps Neg Hx    Esophageal cancer Neg Hx    Stomach cancer Neg Hx    Rectal cancer Neg Hx     Marital Status: Married  ROS  Review of Systems  Cardiovascular:  Positive for syncope. Negative for chest pain, dyspnea on exertion and leg swelling.   Objective      06/12/2023    8:30 AM 06/12/2023    8:00 AM 06/12/2023    5:00 AM  Vitals with BMI  Systolic 132 126 811  Diastolic 68 72 70  Pulse 79 67 70    Blood pressure 132/68, pulse 79, temperature 99.8 F (37.7 C), temperature source Oral, resp. rate (!) 21, height 5\' 9"  (1.753 m), weight 83.9 kg, SpO2 98%.    Physical Exam Neck:     Vascular: No carotid bruit or JVD.  Cardiovascular:     Rate and Rhythm: Normal rate and regular rhythm.     Pulses: Intact distal pulses.     Heart sounds: Normal heart sounds. No murmur heard.    No gallop.  Pulmonary:     Effort: Pulmonary effort is normal.     Breath sounds: Normal breath sounds.  Abdominal:     General: Bowel sounds are normal.     Palpations: Abdomen is soft.  Musculoskeletal:     Right lower leg: No edema.     Left lower leg: No edema.   Carotid massage both sitting and standing did not elicit symptoms.  Laboratory examination:   Recent Labs    09/23/22 0402 03/04/23 0911 06/11/23 2118 06/12/23 0607  NA 141 143 144 138  K 3.9 4.6 3.9 3.8  CL 108 105 105 104  CO2 25 27 25 25   GLUCOSE 85 92 108* 95  BUN 8 16 12 6   CREATININE 0.68 0.83 0.85 0.77  CALCIUM 8.5* 9.5 8.8* 8.4*  GFRNONAA >60  --  >60 >60   estimated creatinine  clearance is 108 mL/min (by C-G formula based on SCr of 0.77 mg/dL).     Latest Ref Rng & Units 06/12/2023    6:07 AM 06/11/2023    9:18 PM 03/04/2023    9:11 AM  CMP  Glucose 70 - 99 mg/dL 95  914  92   BUN 6 - 20 mg/dL 6  12  16    Creatinine 0.61 -  1.24 mg/dL 3.29  5.18  8.41   Sodium 135 - 145 mmol/L 138  144  143   Potassium 3.5 - 5.1 mmol/L 3.8  3.9  4.6   Chloride 98 - 111 mmol/L 104  105  105   CO2 22 - 32 mmol/L 25  25  27    Calcium 8.9 - 10.3 mg/dL 8.4  8.8  9.5   Total Protein 6.5 - 8.1 g/dL  6.5  6.9   Total Bilirubin 0.3 - 1.2 mg/dL  0.4  0.7   Alkaline Phos 38 - 126 U/L  81  57   AST 15 - 41 U/L  71  18   ALT 0 - 44 U/L  61  16       Latest Ref Rng & Units 06/12/2023    6:07 AM 06/11/2023    9:18 PM 09/23/2022    4:02 AM  CBC  WBC 4.0 - 10.5 K/uL 12.1  14.1  8.2   Hemoglobin 13.0 - 17.0 g/dL 66.0  63.0  16.0   Hematocrit 39.0 - 52.0 % 40.2  41.6  39.9   Platelets 150 - 400 K/uL 181  190  203    Lipid Panel No results for input(s): "CHOL", "TRIG", "LDLCALC", "VLDL", "HDL", "CHOLHDL", "LDLDIRECT" in the last 8760 hours.  HEMOGLOBIN A1C Lab Results  Component Value Date   HGBA1C 5.4 06/12/2023   MPG 108.28 06/12/2023   TSH Recent Labs    06/12/23 0607  TSH 0.539   BNP (last 3 results) No results for input(s): "BNP" in the last 8760 hours. Cardiac Panel (last 3 results) Recent Labs    06/11/23 2118 06/11/23 2338  TROPONINIHS 5 4     Medications and allergies   Allergies  Allergen Reactions   Nsaids Other (See Comments)    Hx of gastric bypass      Current Meds  Medication Sig   acetaminophen-codeine (TYLENOL #3) 300-30 MG tablet Take 1 tablet by mouth every 6 (six) hours as needed for moderate pain.   amoxicillin (AMOXIL) 875 MG tablet Take 875 mg by mouth 2 (two) times daily.   Calcium 500-2.5 MG-MCG CHEW Chew 1 tablet by mouth in the morning, at noon, and at bedtime.   loratadine (CLARITIN) 10 MG tablet Take 10 mg by mouth at bedtime.    Multiple Vitamins-Minerals (BARIATRIC MULTIVITAMINS/IRON) CAPS Take 1 capsule by mouth daily.   rOPINIRole (REQUIP) 2 MG tablet Take 1 tablet (2 mg total) by mouth at bedtime.    Scheduled Meds:  amoxicillin-clavulanate  1 tablet Oral Q12H   enoxaparin (LOVENOX) injection  40 mg Subcutaneous Daily   loratadine  10 mg Oral QHS   metoprolol succinate  25 mg Oral Daily   rOPINIRole  2 mg Oral QHS   sodium chloride flush  3 mL Intravenous Q12H   Continuous Infusions:  sodium chloride     PRN Meds:.sodium chloride, acetaminophen **OR** acetaminophen, acetaminophen-codeine, diphenhydrAMINE, hydrALAZINE, morphine injection, ondansetron **OR** ondansetron (ZOFRAN) IV, sodium chloride flush   I/O last 3 completed shifts: In: 500 [I.V.:500] Out: -  No intake/output data recorded.  Net IO Since Admission: 500 mL [06/12/23 1138]   Radiology:   CT Head Wo Contrast 06/11/2023 CLINICAL DATA:  Syncope EXAM: CT HEAD WITHOUT CONTRAST TECHNIQUE: Contiguous axial images were obtained from the base of the skull through the vertex without intravenous contrast. RADIATION DOSE REDUCTION: This exam was performed according to the departmental dose-optimization program which includes automated exposure control, adjustment of the mA  and/or kV according to patient size and/or use of iterative reconstruction technique. COMPARISON:  None Available. FINDINGS: Brain: No evidence of acute infarction, hemorrhage, hydrocephalus, extra-axial collection or mass lesion/mass effect. Vascular: No hyperdense vessel or unexpected calcification. Skull: Normal. Negative for fracture or focal lesion. Sinuses/Orbits: No acute finding. Other: None IMPRESSION: Negative non contrasted CT appearance of the brain. Electronically Signed   By: Jasmine Pang M.D.   On: 06/11/2023 23:15   DG Chest 2 View 06/11/2023 CLINICAL DATA:  Recurrent syncope EXAM: CHEST - 2 VIEW COMPARISON:  07/08/2019 FINDINGS: Mild emphysematous changes are suggested  in the lungs. Heart size and pulmonary vascularity are normal. No airspace disease or consolidation. No pleural effusions. No pneumothorax. Mediastinal contours appear intact. IMPRESSION: No active cardiopulmonary disease. Electronically Signed   By: Burman Nieves M.D.   On: 06/11/2023 21:52    Cardiac Studies:   Coronary angiogram 10/03/2016: Normal LVEF, normal EDP.  Echocardiogram 06/12/2023  1. Left ventricular ejection fraction, by estimation, is 70 to 75%. The left ventricle has hyperdynamic function. The left ventricle has no regional wall motion abnormalities. Left ventricular diastolic parameters were normal.  2. Right ventricular systolic function is normal. The right ventricular size is normal.  3. The mitral valve is normal in structure. No evidence of mitral valve regurgitation. No evidence of mitral stenosis.  4. The aortic valve is normal in structure. Aortic valve regurgitation is not visualized. No aortic stenosis is present.  5. The inferior vena cava is normal in size with greater than 50% respiratory variability, suggesting right atrial pressure of 3 mmHg.  EKG:  EKG 06/11/2023: Normal sinus rhythm, ventricular rate 64 bpm.  Poor R progression, probably normal variant.  No evidence of ischemia, normal QT interval.  Assessment   Neurocardiogenic /Vasodepressor syncope   Recommendations:   Patient is a healthy 52 year old Caucasian male with no prior significant cardiovascular history admitted with recurrent episodes of syncope.  Syncope started with root canal treatment and pain in his jaw and swelling in his jaw while he was standing had premonitory symptoms associated with syncope.  Yesterday had episode of syncope while going to the bathroom again while patient was laying down and went to the bathroom followed by syncope.  Although he had 3 back-to-back syncope yesterday as patient was made to sit up while he was still having symptoms of dizziness and fatigue, suspect  this led to hypoperfusion of his brain and low blood pressure.  And probably bradycardia induced.  I did carotid sinus massage, it does induce mild bradycardia however remains asymptomatic.  No significant pauses.  I suspect his symptoms are clearly related to neurocardiogenic syncope and do not suspect any significant arrhythmic etiology for his syncope.  I have extensively discussed with the patient and his husband regarding prevention of fall, counterpressure maneuver, good hydration and also probably consider squatting to urinate to avoid recurrence of syncope at least for the next several months.  He can be discharged home, he will need outpatient EKG monitoring which I will set up and also set him up for routine treadmill exercise stress test although his cardiac catheterization in 2017 was completely normal.  In view of suspected neurocardiogenic syncope, I will start him on metoprolol succinate 25 mg daily.  Discussed with primary team.  Metoprolol succinate 25 mg daily. Out patient GXT (treadmill stress) and OP event 2 weeks which I will set up. Smoking cessation has been discussed with the patient.  Counter pressure maneuver (bearing down), hydration, fall precautions  discussed. His Husband Arlys John present and all questions answered. Discussed with primary team.    Yates Decamp, MD, [facility] 06/12/2023, 11:38 AM Office: [phone]

## 2023-06-12 NOTE — Progress Notes (Signed)
Received pt on the unit, got pt acclimated to floor. Bed is locked at the lowest setting with alarm on, call bell within reach

## 2023-06-12 NOTE — ED Notes (Signed)
ED TO INPATIENT HANDOFF REPORT  ED Nurse Name and Phone #: Scheryl Marten RN, [phone]  S Name/Age/Gender Jonathan Rivas 52 y.o. male Room/Bed: 026C/026C  Code Status   Code Status: Full Code  Home/SNF/Other Home Patient oriented to: self, place, time, and situation Is this baseline? Yes   Triage Complete: Triage complete  Chief Complaint Syncope [R55]  Triage Note Pt arrived with EMS for multiple syncopal episodes today. Had a root canal on Tuesday and has had ongoing pain from procedure. Was called in tylenol 3s today (first doses at 1530) and started PO antibiotic. Reports chills earlier today also. C/o left buttock pain and has abrasion to L knee from first syncopal episode this morning. Son called EMS after pt had 2 more episodes. Bp noted at low 100s systolic on ems arrival with EKG irregularities. NS given enroute    Allergies Allergies  Allergen Reactions   Nsaids Other (See Comments)    Hx of gastric bypass     Level of Care/Admitting Diagnosis ED Disposition     ED Disposition  Admit   Condition  --   Comment  Hospital Area: [facility] [[id number]]  Level of Care: Telemetry Medical [104]  May place patient in observation at [facility] or [facility] if equivalent level of care is available:: No  Covid Evaluation: Asymptomatic - no recent exposure (last 10 days) testing not required  Diagnosis: Syncope [206001]  Admitting Physician: Tereasa Coop [[id number]]  Attending Physician: Tereasa Coop [[id number]]          B Medical/Surgery History Past Medical History:  Diagnosis Date   Allergic rhinitis    Allergy    Arthritis    At risk for diabetes mellitus 10/28/2018   Blood transfusion without reported diagnosis    with bariatric surgery   Depression    Erectile dysfunction    Erectile dysfunction 04/10/2015   Hyperlipidemia 06/22/2019   Insomnia 12/28/2017   Morbid obesity (HCC) 04/10/2015   Numbness and tingling in both  hands 09/25/2016   Obesity, morbid, BMI 40.0-49.9 (HCC)    Palpitations 09/25/2016   Pure hypercholesterolemia 06/22/2019   ASCVD 10-yr risk ~ 3% (2021)   Syncope and collapse 06/06/2022   Tobacco use    Tubular adenoma of colon 06/14/2021   Tubular adenoma colon x 1 (< 1 cm)   Urinary incontinence, post-void dribbling 10/29/2018   Past Surgical History:  Procedure Laterality Date   BUNIONECTOMY Right 2023   CARDIAC CATHETERIZATION N/A 10/03/2016   No CAD: Procedure: Left Heart Cath and Coronary Angiography;  Surgeon: Lennette Bihari, MD;  Location: [facility];  Service: Cardiovascular;  Laterality: N/A;   CHOLECYSTECTOMY N/A 09/23/2022   Procedure: LAPAROSCOPIC CHOLECYSTECTOMY WITH INTRAOPERATIVE CHOLANGIOGRAM;  Surgeon: Quentin Ore, MD;  Location: [facility];  Service: General;  Laterality: N/A;   COLONOSCOPY  06/07/2021   CYSTOSCOPY     Hematuria workup.  No recalled significant findings   LAPAROSCOPIC GASTRIC SLEEVE RESECTION N/A 11/28/2019   Procedure: LAPAROSCOPIC GASTRIC SLEEVE RESECTION, UPPER Endo, Eras Pathway;  Surgeon: Luretha Murphy, MD;  Location: [facility];  Service: General;  Laterality: N/A;   wisdom teeth       A IV Location/Drains/Wounds Patient Lines/Drains/Airways Status     Active Line/Drains/Airways     Name Placement date Placement time Site Days   Peripheral IV 06/11/23 18 G Left Antecubital 06/11/23  2031  Antecubital  1   Incision - 4 Ports Abdomen 1: Right;Lateral 2: Right;Upper  3: Medial;Upper 4: Umbilicus 09/23/22  --  -- 262   Incision - 6 Ports Abdomen Right;Lateral Right;Medial Mid;Medial Mid;Upper Left;Medial Left;Lateral 11/28/19  0750  -- 1292            Intake/Output Last 24 hours  Intake/Output Summary (Last 24 hours) at 06/12/2023 0235 Last data filed at 06/11/2023 2031 Gross per 24 hour  Intake 500 ml  Output --  Net 500 ml    Labs/Imaging Results for orders placed or performed during the hospital encounter of  06/11/23 (from the past 48 hour(s))  CBC with Differential     Status: Abnormal   Collection Time: 06/11/23  9:18 PM  Result Value Ref Range   WBC 14.1 (H) 4.0 - 10.5 K/uL   RBC 4.40 4.22 - 5.81 MIL/uL   Hemoglobin 13.7 13.0 - 17.0 g/dL   HCT 16.1 09.6 - 04.5 %   MCV 94.5 80.0 - 100.0 fL   MCH 31.1 26.0 - 34.0 pg   MCHC 32.9 30.0 - 36.0 g/dL   RDW 40.9 81.1 - 91.4 %   Platelets 190 150 - 400 K/uL   nRBC 0.0 0.0 - 0.2 %   Neutrophils Relative % 80 %   Neutro Abs 11.1 (H) 1.7 - 7.7 K/uL   Lymphocytes Relative 12 %   Lymphs Abs 1.8 0.7 - 4.0 K/uL   Monocytes Relative 8 %   Monocytes Absolute 1.1 (H) 0.1 - 1.0 K/uL   Eosinophils Relative 0 %   Eosinophils Absolute 0.1 0.0 - 0.5 K/uL   Basophils Relative 0 %   Basophils Absolute 0.0 0.0 - 0.1 K/uL   Immature Granulocytes 0 %   Abs Immature Granulocytes 0.06 0.00 - 0.07 K/uL    Comment: Performed at [facility], [address], [place], [place] [address]  Comprehensive metabolic panel     Status: Abnormal   Collection Time: 06/11/23  9:18 PM  Result Value Ref Range   Sodium 144 135 - 145 mmol/L   Potassium 3.9 3.5 - 5.1 mmol/L   Chloride 105 98 - 111 mmol/L   CO2 25 22 - 32 mmol/L   Glucose, Bld 108 (H) 70 - 99 mg/dL    Comment: Glucose reference range applies only to samples taken after fasting for at least 8 hours.   BUN 12 6 - 20 mg/dL   Creatinine, Ser 6.21 0.61 - 1.24 mg/dL   Calcium 8.8 (L) 8.9 - 10.3 mg/dL   Total Protein 6.5 6.5 - 8.1 g/dL   Albumin 3.7 3.5 - 5.0 g/dL   AST 71 (H) 15 - 41 U/L   ALT 61 (H) 0 - 44 U/L   Alkaline Phosphatase 81 38 - 126 U/L   Total Bilirubin 0.4 0.3 - 1.2 mg/dL   GFR, Estimated >30 >86 mL/min    Comment: (NOTE) Calculated using the CKD-EPI Creatinine Equation (2021)    Anion gap 14 5 - 15    Comment: Performed at [facility], [address], [place], [place] [address]  Troponin I (High Sensitivity)     Status: None   Collection Time: 06/11/23  9:18 PM  Result Value  Ref Range   Troponin I (High Sensitivity) 5 <18 ng/L    Comment: (NOTE) Elevated high sensitivity troponin I (hsTnI) values and significant  changes across serial measurements may suggest ACS but many other  chronic and acute conditions are known to elevate hsTnI results.  Refer to the "Links" section for chest pain algorithms and  additional  guidance. Performed at [facility], [address] [place], [place] [address]   Magnesium     Status: None   Collection Time: 06/11/23  9:18 PM  Result Value Ref Range   Magnesium 2.0 1.7 - 2.4 mg/dL    Comment: Performed at [facility], [address] [place], [place] [address]  Troponin I (High Sensitivity)     Status: None   Collection Time: 06/11/23 11:38 PM  Result Value Ref Range   Troponin I (High Sensitivity) 4 <18 ng/L    Comment: (NOTE) Elevated high sensitivity troponin I (hsTnI) values and significant  changes across serial measurements may suggest ACS but many other  chronic and acute conditions are known to elevate hsTnI results.  Refer to the "Links" section for chest pain algorithms and additional  guidance. Performed at [facility], [address] [place], [place] [address]    CT Head Wo Contrast  Result Date: 06/11/2023 CLINICAL DATA:  Syncope EXAM: CT HEAD WITHOUT CONTRAST TECHNIQUE: Contiguous axial images were obtained from the base of the skull through the vertex without intravenous contrast. RADIATION DOSE REDUCTION: This exam was performed according to the departmental dose-optimization program which includes automated exposure control, adjustment of the mA and/or kV according to patient size and/or use of iterative reconstruction technique. COMPARISON:  None Available. FINDINGS: Brain: No evidence of acute infarction, hemorrhage, hydrocephalus, extra-axial collection or mass lesion/mass effect. Vascular: No hyperdense vessel or unexpected calcification. Skull: Normal. Negative for fracture or focal  lesion. Sinuses/Orbits: No acute finding. Other: None IMPRESSION: Negative non contrasted CT appearance of the brain. Electronically Signed   By: Jasmine Pang M.D.   On: 06/11/2023 23:15   DG Chest 2 View  Result Date: 06/11/2023 CLINICAL DATA:  Recurrent syncope EXAM: CHEST - 2 VIEW COMPARISON:  07/08/2019 FINDINGS: Mild emphysematous changes are suggested in the lungs. Heart size and pulmonary vascularity are normal. No airspace disease or consolidation. No pleural effusions. No pneumothorax. Mediastinal contours appear intact. IMPRESSION: No active cardiopulmonary disease. Electronically Signed   By: Burman Nieves M.D.   On: 06/11/2023 21:52    Pending Labs Unresulted Labs (From admission, onward)     Start     Ordered   06/12/23 0500  TSH  Tomorrow morning,   R        06/12/23 0117   06/12/23 0500  Lipid panel  Tomorrow morning,   R        06/12/23 0117   06/12/23 0500  Hemoglobin A1c  Tomorrow morning,   R        06/12/23 0117   06/12/23 0500  Rapid urine drug screen (hospital performed)  Tomorrow morning,   R        06/12/23 0203   06/12/23 0204  Vitamin B12  Add-on,   AD        06/12/23 0203   06/12/23 0204  Folate  Add-on,   AD        06/12/23 0203   06/12/23 0116  HIV Antibody (routine testing w rflx)  (HIV Antibody (Routine testing w reflex) panel)  Once,   R        06/12/23 0117            Vitals/Pain Today's Vitals   06/12/23 0000 06/12/23 0004 06/12/23 0030 06/12/23 0030  BP: (!) 141/76     Pulse: 83     Resp: 16     Temp:   98.7 F (  37.1 C)   TempSrc:   Oral   SpO2: 100%     Weight:      Height:      PainSc:  7   3     Isolation Precautions No active isolations  Medications Medications  rOPINIRole (REQUIP) tablet 2 mg (has no administration in time range)  enoxaparin (LOVENOX) injection 40 mg (has no administration in time range)  sodium chloride flush (NS) 0.9 % injection 3 mL (has no administration in time range)  sodium chloride flush (NS)  0.9 % injection 3 mL (has no administration in time range)  0.9 %  sodium chloride infusion (has no administration in time range)  acetaminophen (TYLENOL) tablet 650 mg (has no administration in time range)    Or  acetaminophen (TYLENOL) suppository 650 mg (has no administration in time range)  ondansetron (ZOFRAN) tablet 4 mg (has no administration in time range)    Or  ondansetron (ZOFRAN) injection 4 mg (has no administration in time range)  hydrALAZINE (APRESOLINE) injection 5 mg (has no administration in time range)  amoxicillin-clavulanate (AUGMENTIN) 875-125 MG per tablet 1 tablet (has no administration in time range)  lactated ringers bolus 1,000 mL (0 mLs Intravenous Stopped 06/11/23 2354)  morphine (PF) 4 MG/ML injection 4 mg (4 mg Intravenous Given 06/12/23 0004)    Mobility walks     Focused Assessments Neuro Assessment Handoff:  Swallow screen pass? Yes          Neuro Assessment:   Neuro Checks:      Has TPA been given? No If patient is a Neuro Trauma and patient is going to OR before floor call report to 4N Charge nurse: [phone] or [phone]   R Recommendations: See Admitting Provider Note  Report given to:   Additional Notes: n/a

## 2023-06-12 NOTE — Discharge Summary (Signed)
Physician Discharge Summary   Patient: Jonathan Rivas MRN: [id number] DOB: 1971-04-17  Admit date:     06/11/2023  Discharge date: 06/12/23  Discharge Physician: Kathlen Mody   PCP: Loyola Mast, MD   Recommendations at discharge:  Please follow up with cardiology for event monitor placement.   Discharge Diagnoses: Principal Problem:   Syncope Active Problems:   Restless leg syndrome   Leukocytosis   Hospital Course:  52 year old man medical history restless leg syndrome, cholecystectomy, major depressive disorder, and laparoscopic sleeve gastrectomy in 2021 presented to emergency department with complaining of lightheadedness followed by falling on the ground with loss of consciousness.   Assessment and Plan:  Vasovagal syncope:  Echo unremarkable.  Telemetry NSR.  CT HEAD WNL. Cardiology consulted and recommendations given.  Orthostatic vital signs negative.  Holter monitor placement later today on discharge.     Leukocytosis possibly from root canal infection.  Continue with amoxicillin.    Restless leg syndrome  Ropinirole 2 mg at bedside.      Consultants: cardiology.  Procedures performed: echo  Disposition: Home Diet recommendation:  Discharge Diet Orders (From admission, onward)     Start     Ordered   06/12/23 0000  Diet - low sodium heart healthy        06/12/23 1404           Regular diet DISCHARGE MEDICATION: Allergies as of 06/12/2023       Reactions   Nsaids Other (See Comments)   Hx of gastric bypass         Medication List     TAKE these medications    acetaminophen-codeine 300-30 MG tablet Commonly known as: TYLENOL #3 Take 1 tablet by mouth every 6 (six) hours as needed for moderate pain.   amoxicillin 875 MG tablet Commonly known as: AMOXIL Take 875 mg by mouth 2 (two) times daily.   Bariatric Multivitamins/Iron Caps Take 1 capsule by mouth daily.   Calcium 500-2.5 MG-MCG Chew Chew 1 tablet by mouth in the  morning, at noon, and at bedtime.   loratadine 10 MG tablet Commonly known as: CLARITIN Take 10 mg by mouth at bedtime.   metoprolol succinate 25 MG 24 hr tablet Commonly known as: TOPROL-XL Take 1 tablet (25 mg total) by mouth daily.   rOPINIRole 2 MG tablet Commonly known as: REQUIP Take 1 tablet (2 mg total) by mouth at bedtime.        Follow-up Information     Yates Decamp, MD. Schedule an appointment as soon as possible for a visit.   Specialty: Cardiology Why: Our office will set up monitor and treadmill stress and visit with me Contact information: [address] [place] [address] [phone]         Loyola Mast, MD Follow up.   Specialty: Family Medicine Contact information: [address] [place] [address] [phone]                Discharge Exam: Ceasar Mons Weights   06/11/23 2040  Weight: 83.9 kg   General exam: Appears calm and comfortable  Respiratory system: Clear to auscultation. Respiratory effort normal. Cardiovascular system: S1 & S2 heard, RRR. No JVD, murmurs, rubs, gallops or clicks. No pedal edema. Gastrointestinal system: Abdomen is nondistended, soft and nontender. No organomegaly or masses felt. Normal bowel sounds heard. Central nervous system: Alert and oriented. No focal neurological deficits. Extremities: Symmetric 5 x 5 power. Skin: No rashes, lesions or ulcers  Psychiatry: Judgement and insight appear normal. Mood & affect appropriate.    Condition at discharge: fair  The results of significant diagnostics from this hospitalization (including imaging, microbiology, ancillary and laboratory) are listed below for reference.   Imaging Studies: ECHOCARDIOGRAM COMPLETE  Result Date: 06/12/2023    ECHOCARDIOGRAM REPORT   Patient Name:   Jonathan Rivas Date of Exam: 06/12/2023 Medical Rec #:  [id number]         Height:       69.0 in Accession #:    [id number]        Weight:       185.0 lb Date of  Birth:  07-18-1971         BSA:          1.999 m Patient Age:    52 years          BP:           124/70 mmHg Patient Gender: M                 HR:           84 bpm. Exam Location:  Inpatient Procedure: 2D Echo, Color Doppler and Cardiac Doppler Indications:    Syncope  History:        Patient has no prior history of Echocardiogram examinations.                 Signs/Symptoms:Syncope; Risk Factors:Dyslipidemia.  Sonographer:    Raeford Razor Referring Phys: [id number] SUBRINA SUNDIL IMPRESSIONS  1. Left ventricular ejection fraction, by estimation, is 70 to 75%. The left ventricle has hyperdynamic function. The left ventricle has no regional wall motion abnormalities. Left ventricular diastolic parameters were normal.  2. Right ventricular systolic function is normal. The right ventricular size is normal.  3. The mitral valve is normal in structure. No evidence of mitral valve regurgitation. No evidence of mitral stenosis.  4. The aortic valve is normal in structure. Aortic valve regurgitation is not visualized. No aortic stenosis is present.  5. The inferior vena cava is normal in size with greater than 50% respiratory variability, suggesting right atrial pressure of 3 mmHg. FINDINGS  Left Ventricle: Left ventricular ejection fraction, by estimation, is 70 to 75%. The left ventricle has hyperdynamic function. The left ventricle has no regional wall motion abnormalities. The left ventricular internal cavity size was normal in size. There is no left ventricular hypertrophy. Left ventricular diastolic parameters were normal. Right Ventricle: The right ventricular size is normal. No increase in right ventricular wall thickness. Right ventricular systolic function is normal. Left Atrium: Left atrial size was normal in size. Right Atrium: Right atrial size was normal in size. Pericardium: There is no evidence of pericardial effusion. Mitral Valve: The mitral valve is normal in structure. No evidence of mitral valve  regurgitation. No evidence of mitral valve stenosis. Tricuspid Valve: The tricuspid valve is normal in structure. Tricuspid valve regurgitation is trivial. No evidence of tricuspid stenosis. Aortic Valve: The aortic valve is normal in structure. Aortic valve regurgitation is not visualized. No aortic stenosis is present. Aortic valve peak gradient measures 12.2 mmHg. Pulmonic Valve: The pulmonic valve was normal in structure. Pulmonic valve regurgitation is not visualized. No evidence of pulmonic stenosis. Aorta: The aortic root is normal in size and structure. Venous: The inferior vena cava is normal in size with greater than 50% respiratory variability, suggesting right atrial pressure of 3 mmHg. IAS/Shunts: No atrial level shunt detected by color flow Doppler.  LEFT VENTRICLE PLAX 2D LVIDd:         5.00 cm   Diastology LVIDs:         3.70 cm   LV e' medial:    14.90 cm/s LV PW:         1.00 cm   LV E/e' medial:  6.7 LV IVS:        1.00 cm   LV e' lateral:   13.40 cm/s LVOT diam:     2.00 cm   LV E/e' lateral: 7.4 LV SV:         61 LV SV Index:   30 LVOT Area:     3.14 cm  RIGHT VENTRICLE             IVC RV Basal diam:  2.90 cm     IVC diam: 1.60 cm RV S prime:     22.50 cm/s TAPSE (M-mode): 4.7 cm LEFT ATRIUM             Index        RIGHT ATRIUM           Index LA diam:        2.70 cm 1.35 cm/m   RA Area:     20.70 cm LA Vol (A2C):   54.0 ml 27.02 ml/m  RA Volume:   57.60 ml  28.82 ml/m LA Vol (A4C):   64.7 ml 32.37 ml/m LA Biplane Vol: 63.8 ml 31.92 ml/m  AORTIC VALVE AV Area (Vmax): 1.92 cm AV Vmax:        175.00 cm/s AV Peak Grad:   12.2 mmHg LVOT Vmax:      107.00 cm/s LVOT Vmean:     75.800 cm/s LVOT VTI:       0.193 m  AORTA Ao Root diam: 2.80 cm Ao Asc diam:  2.70 cm MITRAL VALVE MV Area (PHT): 3.91 cm     SHUNTS MV Decel Time: 194 msec     Systemic VTI:  0.19 m MV E velocity: 99.40 cm/s   Systemic Diam: 2.00 cm MV A velocity: 107.00 cm/s MV E/A ratio:  0.93 Arvilla Meres MD Electronically  signed by Arvilla Meres MD Signature Date/Time: 06/12/2023/10:47:52 AM    Final    CT Head Wo Contrast  Result Date: 06/11/2023 CLINICAL DATA:  Syncope EXAM: CT HEAD WITHOUT CONTRAST TECHNIQUE: Contiguous axial images were obtained from the base of the skull through the vertex without intravenous contrast. RADIATION DOSE REDUCTION: This exam was performed according to the departmental dose-optimization program which includes automated exposure control, adjustment of the mA and/or kV according to patient size and/or use of iterative reconstruction technique. COMPARISON:  None Available. FINDINGS: Brain: No evidence of acute infarction, hemorrhage, hydrocephalus, extra-axial collection or mass lesion/mass effect. Vascular: No hyperdense vessel or unexpected calcification. Skull: Normal. Negative for fracture or focal lesion. Sinuses/Orbits: No acute finding. Other: None IMPRESSION: Negative non contrasted CT appearance of the brain. Electronically Signed   By: Jasmine Pang M.D.   On: 06/11/2023 23:15   DG Chest 2 View  Result Date: 06/11/2023 CLINICAL DATA:  Recurrent syncope EXAM: CHEST - 2 VIEW COMPARISON:  07/08/2019 FINDINGS: Mild emphysematous changes are suggested in the lungs. Heart size and pulmonary vascularity are normal. No airspace disease or consolidation. No pleural effusions. No pneumothorax. Mediastinal contours appear intact. IMPRESSION: No active cardiopulmonary disease. Electronically Signed   By: Burman Nieves M.D.   On: 06/11/2023 21:52    Microbiology: Results for orders placed or  performed during the hospital encounter of 11/24/19  Novel Coronavirus, NAA (Hosp order, Send-out to Ref Lab; TAT 18-24 hrs     Status: None   Collection Time: 11/24/19 11:08 AM   Specimen: Nasopharyngeal Swab; Respiratory  Result Value Ref Range Status   SARS-CoV-2, NAA NOT DETECTED NOT DETECTED Final    Comment: (NOTE) This nucleic acid amplification test was developed and its performance  characteristics determined by [employer]. Nucleic acid amplification tests include PCR and TMA. This test has not been FDA cleared or approved. This test has been authorized by FDA under an Emergency Use Authorization (EUA). This test is only authorized for the duration of time the declaration that circumstances exist justifying the authorization of the emergency use of in vitro diagnostic tests for detection of SARS-CoV-2 virus and/or diagnosis of COVID-19 infection under section 564(b)(1) of the Act, 21 U.S.C. 811BJY-7(W) (1), unless the authorization is terminated or revoked sooner. When diagnostic testing is negative, the possibility of a false negative result should be considered in the context of a patient's recent exposures and the presence of clinical signs and symptoms consistent with COVID-19. An individual without symptoms of COVID- 19 and who is not shedding SARS-CoV-2 vi rus would expect to have a negative (not detected) result in this assay. Performed At: [facility] [address] [place], [place] [id number] Jolene Schimke MD MV:[id number]    Coronavirus Source NASOPHARYNGEAL  Final    Comment: Performed at [facility], [address] [place], [place] [address]    Labs: CBC: Recent Labs  Lab 06/11/23 2118 06/12/23 0607  WBC 14.1* 12.1*  NEUTROABS 11.1*  --   HGB 13.7 13.1  HCT 41.6 40.2  MCV 94.5 93.9  PLT 190 181   Basic Metabolic Panel: Recent Labs  Lab 06/11/23 2118 06/12/23 0607  NA 144 138  K 3.9 3.8  CL 105 104  CO2 25 25  GLUCOSE 108* 95  BUN 12 6  CREATININE 0.85 0.77  CALCIUM 8.8* 8.4*  MG 2.0  --    Liver Function Tests: Recent Labs  Lab 06/11/23 2118  AST 71*  ALT 61*  ALKPHOS 81  BILITOT 0.4  PROT 6.5  ALBUMIN 3.7   CBG: No results for input(s): "GLUCAP" in the last 168 hours.  Discharge time spent: 45 minutes.   Signed: Kathlen Mody, MD [facility] 06/12/2023

## 2023-06-12 NOTE — Plan of Care (Signed)

## 2023-06-12 NOTE — H&P (Addendum)
History and Physical    Jonathan Rivas SWF:[id number] DOB: [birth year] DOA: 06/11/2023  PCP: Loyola Mast, MD   Patient coming from: Home   Chief Complaint:  Chief Complaint  Patient presents with   Loss of Consciousness    HPI:  This is a 52 year old man medical history restless leg syndrome, cholecystectomy, major depressive disorder, and laparoscopic sleeve gastrectomy in 2021 presented to emergency department with complaining of lightheadedness followed by falling on the ground with loss of consciousness.  Per patient's significant other at the bedside patient has 2 episode of syncope.  No evidence of seizure or confusion afterward.  Patient reported recent root canal on 06/09/2023 and taking Tylenol.  Patient denies any chest pain, palpitation, headache, blurry vision, focal weakness, speech difficulty, swallowing difficulty and generalized weakness. Patient reported family history of sudden cardiac death of father and grandfather.  ED Course:  At ED on initial presentation heart rate 83, respiratory 16, blood pressure 120/68 and O2 sat 100% room air. CBC showed WBC 14.1, RBC 4.4, hemoglobin 13, and platelet count 190. CMP sodium 144, potassium 3.9, chloride 105, bicarb 15, blood glucose 108, BUN 12, creatinine 0.85, calcium 8.8, albumin 3.7, elevated AST/ALT 81/61 and GFR above 70. Troponin x 2 within normal range.  EKG showed normal sinus rhythm. CT head no acute intracranial abnormality. Chest x-ray no acute cardiopulmonary process.    Review of Systems:  Review of Systems  Constitutional:  Negative for chills, fever and weight loss.  HENT:  Negative for hearing loss.   Eyes:  Negative for blurred vision and photophobia.  Respiratory:  Negative for cough.   Cardiovascular:  Negative for chest pain, palpitations and leg swelling.  Gastrointestinal:  Negative for abdominal pain, heartburn and nausea.  Musculoskeletal:  Negative for myalgias and neck pain.   Neurological:  Positive for loss of consciousness. Negative for dizziness, tingling, tremors, sensory change, speech change, focal weakness, seizures, weakness and headaches.  Psychiatric/Behavioral:  The patient is not nervous/anxious.       Past Medical History:  Diagnosis Date   Allergic rhinitis    Allergy    Arthritis    At risk for diabetes mellitus 10/28/2018   Blood transfusion without reported diagnosis    with bariatric surgery   Depression    Erectile dysfunction    Erectile dysfunction 04/10/2015   Hyperlipidemia 06/22/2019   Insomnia 12/28/2017   Morbid obesity (HCC) 04/10/2015   Numbness and tingling in both hands 09/25/2016   Obesity, morbid, BMI 40.0-49.9 (HCC)    Palpitations 09/25/2016   Pure hypercholesterolemia 06/22/2019   ASCVD 10-yr risk ~ 3% (2021)   Syncope and collapse 06/06/2022   Tobacco use    Tubular adenoma of colon 06/14/2021   Tubular adenoma colon x 1 (< 1 cm)   Urinary incontinence, post-void dribbling 10/29/2018    Past Surgical History:  Procedure Laterality Date   BUNIONECTOMY Right 2023   CARDIAC CATHETERIZATION N/A 10/03/2016   No CAD: Procedure: Left Heart Cath and Coronary Angiography;  Surgeon: Lennette Bihari, MD;  Location: [facility];  Service: Cardiovascular;  Laterality: N/A;   CHOLECYSTECTOMY N/A 09/23/2022   Procedure: LAPAROSCOPIC CHOLECYSTECTOMY WITH INTRAOPERATIVE CHOLANGIOGRAM;  Surgeon: Quentin Ore, MD;  Location: [facility];  Service: General;  Laterality: N/A;   COLONOSCOPY  06/07/2021   CYSTOSCOPY     Hematuria workup.  No recalled significant findings   LAPAROSCOPIC GASTRIC SLEEVE RESECTION N/A 11/28/2019   Procedure: LAPAROSCOPIC GASTRIC SLEEVE RESECTION, UPPER Endo, Eras  Pathway;  Surgeon: Luretha Murphy, MD;  Location: [facility];  Service: General;  Laterality: N/A;   wisdom teeth       reports that he has been smoking cigarettes. He started smoking about 35 years ago. He has a 15 pack-year smoking  history. He has never used smokeless tobacco. He reports that he does not currently use alcohol. He reports that he does not use drugs.  Allergies  Allergen Reactions   Nsaids Other (See Comments)    Hx of gastric bypass     Family History  Problem Relation Age of Onset   Cancer Mother        Leukemia   Heart disease Father        Age 3's   Depression Brother    Alcohol abuse Brother    Drug abuse Brother    Asthma Maternal Aunt    Alcohol abuse Maternal Aunt    Heart disease Paternal Uncle    Heart disease Paternal Grandfather    Colon cancer Neg Hx    Colon polyps Neg Hx    Esophageal cancer Neg Hx    Stomach cancer Neg Hx    Rectal cancer Neg Hx     Prior to Admission medications   Medication Sig Start Date End Date Taking? Authorizing Provider  Calcium 500-2.5 MG-MCG CHEW Chew 1 tablet by mouth in the morning, at noon, and at bedtime.    [provider]  loratadine (CLARITIN) 10 MG tablet Take 10 mg by mouth at bedtime.    [provider]  Multiple Vitamins-Minerals (BARIATRIC MULTIVITAMINS/IRON) CAPS Take 1 capsule by mouth daily. 03/07/21   McDiarmid, Leighton Roach, MD  rOPINIRole (REQUIP) 2 MG tablet Take 1 tablet (2 mg total) by mouth at bedtime. 05/01/23   Loyola Mast, MD     Physical Exam: Vitals:   06/11/23 2040 06/11/23 2041 06/12/23 0000 06/12/23 0030  BP:  120/68 (!) 141/76   Pulse:   83   Resp:   16   Temp:    98.7 F (37.1 C)  TempSrc:    Oral  SpO2:  100% 100%   Weight: 83.9 kg     Height: 5\' 9"  (1.753 m)       Physical Exam Constitutional:      General: He is not in acute distress.    Appearance: He is not ill-appearing.  HENT:     Mouth/Throat:     Mouth: Mucous membranes are moist.  Eyes:     Pupils: Pupils are equal, round, and reactive to light.  Cardiovascular:     Rate and Rhythm: Normal rate and regular rhythm.     Pulses: Normal pulses.     Heart sounds: Normal heart sounds.  Pulmonary:     Effort: Pulmonary  effort is normal.     Breath sounds: Normal breath sounds.  Abdominal:     General: Bowel sounds are normal.  Musculoskeletal:        General: Normal range of motion.     Cervical back: Neck supple.     Right lower leg: No edema.     Left lower leg: No edema.  Skin:    General: Skin is warm.  Neurological:     Mental Status: He is alert and oriented to person, place, and time.     Cranial Nerves: No cranial nerve deficit.     Sensory: No sensory deficit.     Motor: No weakness.     Coordination: Coordination normal.  Gait: Gait normal.  Psychiatric:        Mood and Affect: Mood normal.        Thought Content: Thought content normal.        Judgment: Judgment normal.      Labs on Admission: I have personally reviewed following labs and imaging studies  CBC: Recent Labs  Lab 06/11/23 2118  WBC 14.1*  NEUTROABS 11.1*  HGB 13.7  HCT 41.6  MCV 94.5  PLT 190   Basic Metabolic Panel: Recent Labs  Lab 06/11/23 2118  NA 144  K 3.9  CL 105  CO2 25  GLUCOSE 108*  BUN 12  CREATININE 0.85  CALCIUM 8.8*  MG 2.0   GFR: Estimated Creatinine Clearance: 101.7 mL/min (by C-G formula based on SCr of 0.85 mg/dL). Liver Function Tests: Recent Labs  Lab 06/11/23 2118  AST 71*  ALT 61*  ALKPHOS 81  BILITOT 0.4  PROT 6.5  ALBUMIN 3.7   No results for input(s): "LIPASE", "AMYLASE" in the last 168 hours. No results for input(s): "AMMONIA" in the last 168 hours. Coagulation Profile: No results for input(s): "INR", "PROTIME" in the last 168 hours. Cardiac Enzymes: Recent Labs  Lab 06/11/23 2118 06/11/23 2338  TROPONINIHS 5 4   BNP (last 3 results) No results for input(s): "BNP" in the last 8760 hours. HbA1C: No results for input(s): "HGBA1C" in the last 72 hours. CBG: No results for input(s): "GLUCAP" in the last 168 hours. Lipid Profile: No results for input(s): "CHOL", "HDL", "LDLCALC", "TRIG", "CHOLHDL", "LDLDIRECT" in the last 72 hours. Thyroid Function  Tests: No results for input(s): "TSH", "T4TOTAL", "FREET4", "T3FREE", "THYROIDAB" in the last 72 hours. Anemia Panel: No results for input(s): "VITAMINB12", "FOLATE", "FERRITIN", "TIBC", "IRON", "RETICCTPCT" in the last 72 hours. Urine analysis:    Component Value Date/Time   COLORURINE YELLOW 09/22/2022 2115   APPEARANCEUR CLEAR 09/22/2022 2115   LABSPEC 1.009 09/22/2022 2115   PHURINE 7.0 09/22/2022 2115   GLUCOSEU NEGATIVE 09/22/2022 2115   HGBUR SMALL (A) 09/22/2022 2115   BILIRUBINUR large (A) 10/25/2022 1021   KETONESUR negative 10/25/2022 1021   KETONESUR NEGATIVE 09/22/2022 2115   PROTEINUR =30 (A) 10/25/2022 1021   PROTEINUR NEGATIVE 09/22/2022 2115   UROBILINOGEN 0.2 10/25/2022 1021   NITRITE Negative 10/25/2022 1021   NITRITE NEGATIVE 09/22/2022 2115   LEUKOCYTESUR Negative 10/25/2022 1021   LEUKOCYTESUR NEGATIVE 09/22/2022 2115    Radiological Exams on Admission: I have personally reviewed images CT Head Wo Contrast  Result Date: 06/11/2023 CLINICAL DATA:  Syncope EXAM: CT HEAD WITHOUT CONTRAST TECHNIQUE: Contiguous axial images were obtained from the base of the skull through the vertex without intravenous contrast. RADIATION DOSE REDUCTION: This exam was performed according to the departmental dose-optimization program which includes automated exposure control, adjustment of the mA and/or kV according to patient size and/or use of iterative reconstruction technique. COMPARISON:  None Available. FINDINGS: Brain: No evidence of acute infarction, hemorrhage, hydrocephalus, extra-axial collection or mass lesion/mass effect. Vascular: No hyperdense vessel or unexpected calcification. Skull: Normal. Negative for fracture or focal lesion. Sinuses/Orbits: No acute finding. Other: None IMPRESSION: Negative non contrasted CT appearance of the brain. Electronically Signed   By: Jasmine Pang M.D.   On: 06/11/2023 23:15   DG Chest 2 View  Result Date: 06/11/2023 CLINICAL DATA:   Recurrent syncope EXAM: CHEST - 2 VIEW COMPARISON:  07/08/2019 FINDINGS: Mild emphysematous changes are suggested in the lungs. Heart size and pulmonary vascularity are normal. No airspace disease or consolidation.  No pleural effusions. No pneumothorax. Mediastinal contours appear intact. IMPRESSION: No active cardiopulmonary disease. Electronically Signed   By: Burman Nieves M.D.   On: 06/11/2023 21:52       Assessment/Plan: Principal Problem:   Syncope Active Problems:   Leukocytosis    Assessment and Plan: Syncope-likely vasovagal - Patient has 2 episodes of witnessed syncope as per patient's family member at the bedside.  1 episode of syncope while he was at the outside in the garden and second episode of syncope while he was went to bathroom from supine position and during micturition.  Syncope likely vasovagal in nature.  Given significant family history of sudden cardiac death admitting patient for syncopal workup and observation. - Initial head CT ruled out any acute intracranial abnormality -CBC and CMP unremarkable. -Initial EKG and bedside telemetry showed normal sinus rhythm. - Obtaining echocardiogram - Checking TSH, A1c and lipid panel - Checking orthostatic vital. - Patient will need Zio patch/Holter monitor on discharge.  Leukocytosis - Elevated WBC count 14.1 p.o. patient is afebrile.  No concern for active infection at this time. -Reactive leukocytosis in the setting of recent root canal. - Patient recently had severe root canal on 06/09/2023.  Complaining of pain around the root canal area.  Physical exam did not show any evidence of infection.  Patient reported he has to finish 7 days course of Augmentin. - Resumed Augmentin twice daily for 6 days. Due to recent root canal patient is having trouble chewing.  That is why ordering mechanical soft diet.  Restless leg syndrome - Continue ropinirole 2 mg at bedtime   DVT prophylaxis:    Lovenox Code Status:    Full  Code Diet: Dysphagia diet-mechanical soft Family Communication:   Discussed treatment plan both with patient and patient's family member at the bedside Disposition Plan:   Plan to discharge home in 1 to 2 days Consults:   none Admission status:   Observation, Telemetry bed  Severity of Illness: The appropriate patient status for this patient is OBSERVATION. Observation status is judged to be reasonable and necessary in order to provide the required intensity of service to ensure the patient's safety. The patient's presenting symptoms, physical exam findings, and initial radiographic and laboratory data in the context of their medical condition is felt to place them at decreased risk for further clinical deterioration. Furthermore, it is anticipated that the patient will be medically stable for discharge from the hospital within 2 midnights of admission.     Tereasa Coop MD [facility]  How to contact the [facility] Attending or Consulting provider 7A - 7P or covering provider during after hours 7P -7A, for this patient?   Check the care team in [facility] and look for a) attending/consulting [facility] provider listed and b) the [facility] team listed Log into [email or web address] and use [place] universal password to access. If you do not have the password, please contact the hospital operator. Locate the [facility] provider you are looking for under [facility] and page to a number that you can be directly reached. If you still have difficulty reaching the provider, please page the [facility] (Director on Call) for the Hospitalists listed on amion for assistance.  06/12/2023, 1:50 AM

## 2023-06-12 NOTE — Treatment Plan (Signed)
1mg /0.63ml of morphine wasted with Wynne Dust, RN

## 2023-06-13 ENCOUNTER — Other Ambulatory Visit (HOSPITAL_COMMUNITY): Payer: Self-pay

## 2023-06-15 ENCOUNTER — Telehealth: Payer: Self-pay

## 2023-06-15 NOTE — Transitions of Care (Post Inpatient/ED Visit) (Signed)
   06/15/2023  Name: Jonathan Rivas MRN: [id number] DOB: 29-Jun-1971  Today's TOC FU Call Status: Today's TOC FU Call Status:: Successful TOC FU Call Competed TOC FU Call Complete Date: 06/15/23  Transition Care Management Follow-up Telephone Call Date of Discharge: 06/12/23 Discharge Facility: [facility]) Type of Discharge: Inpatient Admission Primary Inpatient Discharge Diagnosis:: Syncope How have you been since you were released from the hospital?: Better Any questions or concerns?: No  Items Reviewed: Did you receive and understand the discharge instructions provided?: Yes Medications obtained,verified, and reconciled?: Yes (Medications Reviewed) Any new allergies since your discharge?: No Dietary orders reviewed?: Yes Do you have support at home?: Yes  Medications Reviewed Today: Medications Reviewed Today     Reviewed by Merleen Nicely, LPN (Licensed Practical Nurse) on 06/15/23 at 1141  Med List Status: <None>   Medication Order Taking? Sig Documenting Provider Last Dose Status Informant  acetaminophen-codeine (TYLENOL #3) 300-30 MG tablet [id number] Yes Take 1 tablet by mouth every 6 (six) hours as needed for moderate pain. [provider] Taking Active Self  amoxicillin (AMOXIL) 875 MG tablet [id number] Yes Take 875 mg by mouth 2 (two) times daily. [provider] Taking Active Self  Calcium 500-2.5 MG-MCG CHEW [id number] Yes Chew 1 tablet by mouth in the morning, at noon, and at bedtime. [provider] Taking Active Self  loratadine (CLARITIN) 10 MG tablet [id number] Yes Take 10 mg by mouth at bedtime. [provider] Taking Active Self  metoprolol succinate (TOPROL-XL) 25 MG 24 hr tablet [id number] Yes Take 1 tablet (25 mg total) by mouth daily. Kathlen Mody, MD Taking Active   Multiple Vitamins-Minerals (BARIATRIC MULTIVITAMINS/IRON) CAPS [id number] Yes Take 1 capsule by mouth daily. McDiarmid, Leighton Roach, MD Taking Active Self   rOPINIRole (REQUIP) 2 MG tablet [id number] Yes Take 1 tablet (2 mg total) by mouth at bedtime. Loyola Mast, MD Taking Active Self            Home Care and Equipment/Supplies: Were Home Health Services Ordered?: No Any new equipment or medical supplies ordered?: No  Functional Questionnaire: Do you need assistance with bathing/showering or dressing?: No Do you need assistance with meal preparation?: No Do you need assistance with eating?: No Do you have difficulty maintaining continence: No Do you need assistance with getting out of bed/getting out of a chair/moving?: No Do you have difficulty managing or taking your medications?: No  Follow up appointments reviewed: PCP Follow-up appointment confirmed?: Yes Date of PCP follow-up appointment?: 06/19/23 Follow-up Provider: [facility] Follow-up appointment confirmed?: Yes Date of Specialist follow-up appointment?: 07/14/23 Follow-Up Specialty Provider:: Dr Jacinto Halim Do you need transportation to your follow-up appointment?: No Do you understand care options if your condition(s) worsen?: Yes-patient verbalized understanding    SIGNATURE  Woodfin Ganja LPN [facility] Nurse Health Advisor Direct Dial [phone]

## 2023-06-16 ENCOUNTER — Other Ambulatory Visit (HOSPITAL_COMMUNITY): Payer: Self-pay

## 2023-06-19 ENCOUNTER — Inpatient Hospital Stay: Payer: 59 | Admitting: Family Medicine

## 2023-06-22 ENCOUNTER — Ambulatory Visit (INDEPENDENT_AMBULATORY_CARE_PROVIDER_SITE_OTHER): Payer: 59 | Admitting: Family Medicine

## 2023-06-22 ENCOUNTER — Encounter: Payer: Self-pay | Admitting: Family Medicine

## 2023-06-22 VITALS — BP 118/74 | HR 68 | Temp 98.3°F | Ht 69.0 in | Wt 185.2 lb

## 2023-06-22 DIAGNOSIS — G2581 Restless legs syndrome: Secondary | ICD-10-CM

## 2023-06-22 DIAGNOSIS — R55 Syncope and collapse: Secondary | ICD-10-CM

## 2023-06-22 NOTE — Progress Notes (Signed)
[facility] Trecia Rogers [address] [place] [address] Dept: [phone] Dept Fax: [phone]  Hospital Follow-up Office Visit  Subjective:    Patient ID: Jonathan Rivas, male    DOB: 1971-03-25, 52 y.o..   MRN: [id number]  Chief Complaint  Patient presents with   Hospitalization Follow-up    Hospital f/u from 06/12/23 Syncope,   had dental work done prior to hospital visit     History of Present Illness:  Patient is in today for hospital follow-up. Jonathan Rivas was admitted at [facility] from 7/18-7/192024 with syncope. He had several episodes of syncope over a few days. This happened concurrent with him having a dental infection/abscess. He did have a root canal procedure, but was delayed about 24 hours being prescribed an antibiotic and pain meds. His work-up in the hospital did show an elevated WBC count, consistent with his infection, but was otherwise benign. This included a CT scan of the head and an echocardiogram.  Since returning home, he feels he is doing well. He has had no recurrent symptoms. He is eating and taking hydration well.  Jonathan Rivas has a history of restless legs. He had been on ropinirole 2 mg at bedtime. He does find this to be helpful, however, it tends to make him immediately drowsy. he is now using an AXV vibration plate earlier int he evening to assist with evening time symptoms.  Past Medical History: Patient Active Problem List   Diagnosis Date Noted   Syncope 06/12/2023   Allergic rhinitis    S/P laparoscopic sleeve gastrectomy 11/28/2019   Major depression in complete remission (HCC) 04/10/2015   Restless leg syndrome 04/10/2015   Tobacco use disorder 04/10/2015   Past Surgical History:  Procedure Laterality Date   BUNIONECTOMY Right 2023   CARDIAC CATHETERIZATION N/A 10/03/2016   No CAD: Procedure: Left Heart Cath and Coronary Angiography;  Surgeon: Lennette Bihari, MD;  Location: [facility];   Service: Cardiovascular;  Laterality: N/A;   CHOLECYSTECTOMY N/A 09/23/2022   Procedure: LAPAROSCOPIC CHOLECYSTECTOMY WITH INTRAOPERATIVE CHOLANGIOGRAM;  Surgeon: Quentin Ore, MD;  Location: [facility];  Service: General;  Laterality: N/A;   COLONOSCOPY  06/07/2021   CYSTOSCOPY     Hematuria workup.  No recalled significant findings   LAPAROSCOPIC GASTRIC SLEEVE RESECTION N/A 11/28/2019   Procedure: LAPAROSCOPIC GASTRIC SLEEVE RESECTION, UPPER Endo, Eras Pathway;  Surgeon: Luretha Murphy, MD;  Location: [facility];  Service: General;  Laterality: N/A;   wisdom teeth     Family History  Problem Relation Age of Onset   Cancer Mother        Leukemia   Heart disease Father        Age 34's   Depression Brother    Alcohol abuse Brother    Drug abuse Brother    Asthma Maternal Aunt    Alcohol abuse Maternal Aunt    Heart disease Paternal Uncle    Heart disease Paternal Grandfather    Colon cancer Neg Hx    Colon polyps Neg Hx    Esophageal cancer Neg Hx    Stomach cancer Neg Hx    Rectal cancer Neg Hx    Outpatient Medications Prior to Visit  Medication Sig Dispense Refill   Calcium 500-2.5 MG-MCG CHEW Chew 1 tablet by mouth in the morning, at noon, and at bedtime.     loratadine (CLARITIN) 10 MG tablet Take 10 mg by mouth at bedtime.     metoprolol succinate (  TOPROL-XL) 25 MG 24 hr tablet Take 1 tablet (25 mg total) by mouth daily. 30 tablet 1   Multiple Vitamins-Minerals (BARIATRIC MULTIVITAMINS/IRON) CAPS Take 1 capsule by mouth daily.     rOPINIRole (REQUIP) 2 MG tablet Take 1 tablet (2 mg total) by mouth at bedtime. 90 tablet 3   amoxicillin (AMOXIL) 875 MG tablet Take 875 mg by mouth 2 (two) times daily.     acetaminophen-codeine (TYLENOL #3) 300-30 MG tablet Take 1 tablet by mouth every 6 (six) hours as needed for moderate pain.     No facility-administered medications prior to visit.   Allergies  Allergen Reactions   Nsaids Other (See Comments)    Hx of gastric bypass       Objective:   Today's Vitals   06/22/23 1349  BP: 118/74  Pulse: 68  Temp: 98.3 F (36.8 C)  TempSrc: Temporal  SpO2: 97%  Weight: 185 lb 3.2 oz (84 kg)  Height: 5\' 9"  (1.753 m)   Body mass index is 27.35 kg/m.   General: Well developed, well nourished. No acute distress. CV: RRR without murmurs or rubs. Pulses 2+ Psych: Alert and oriented. Normal mood and affect.  There are no preventive care reminders to display for this patient.  Lab Results    Latest Ref Rng & Units 06/12/2023    6:07 AM 06/11/2023    9:18 PM 09/23/2022    4:02 AM  CBC  WBC 4.0 - 10.5 K/uL 12.1  14.1  8.2   Hemoglobin 13.0 - 17.0 g/dL 40.9  81.1  91.4   Hematocrit 39.0 - 52.0 % 40.2  41.6  39.9   Platelets 150 - 400 K/uL 181  190  203        Latest Ref Rng & Units 06/12/2023    6:07 AM 06/11/2023    9:18 PM 03/04/2023    9:11 AM  CMP  Glucose 70 - 99 mg/dL 95  782  92   BUN 6 - 20 mg/dL 6  12  16    Creatinine 0.61 - 1.24 mg/dL 9.56  2.13  0.86   Sodium 135 - 145 mmol/L 138  144  143   Potassium 3.5 - 5.1 mmol/L 3.8  3.9  4.6   Chloride 98 - 111 mmol/L 104  105  105   CO2 22 - 32 mmol/L 25  25  27    Calcium 8.9 - 10.3 mg/dL 8.4  8.8  9.5   Total Protein 6.5 - 8.1 g/dL  6.5  6.9   Total Bilirubin 0.3 - 1.2 mg/dL  0.4  0.7   Alkaline Phos 38 - 126 U/L  81  57   AST 15 - 41 U/L  71  18   ALT 0 - 44 U/L  61  16    Imaging: CT of Head wo contrast (06/11/2023) IMPRESSION: Negative non contrasted CT appearance of the brain.   Echocardiogram (06/12/2023) IMPRESSIONS   1. Left ventricular ejection fraction, by estimation, is 70 to 75%. The left ventricle has hyperdynamic function. The left ventricle has no regional wall motion abnormalities. Left ventricular diastolic parameters were normal.   2. Right ventricular systolic function is normal. The right ventricular size is normal.   3. The mitral valve is normal in structure. No evidence of mitral valve regurgitation. No evidence of mitral  stenosis.   4. The aortic valve is normal in structure. Aortic valve regurgitation is not visualized. No aortic stenosis is present.   5. The inferior vena  cava is normal in size with greater than 50% respiratory variability, suggesting right atrial pressure of 3 mmHg.   Assessment & Plan:   Problem List Items Addressed This Visit       Other   Restless leg syndrome    Continue ropinirole 2 mg at bedtime.      Syncope - Primary    The description of these events sound like vagal reactions. I suspect this was triggered by a combination of his infection and possibly dehydration. He is currently wearing a Zio patch. He should continue his metoprolol succinate 25 mg daily for now.       Return in about 6 months (around 12/23/2023) for Reassessment.   Loyola Mast, MD

## 2023-06-22 NOTE — Assessment & Plan Note (Signed)
Continue ropinirole 2 mg at bedtime.

## 2023-06-22 NOTE — Assessment & Plan Note (Signed)
The description of these events sound like vagal reactions. I suspect this was triggered by a combination of his infection and possibly dehydration. He is currently wearing a Zio patch. He should continue his metoprolol succinate 25 mg daily for now.

## 2023-06-30 DIAGNOSIS — R55 Syncope and collapse: Secondary | ICD-10-CM | POA: Diagnosis not present

## 2023-07-05 DIAGNOSIS — R55 Syncope and collapse: Secondary | ICD-10-CM | POA: Diagnosis not present

## 2023-07-05 NOTE — Progress Notes (Signed)
Zio Patch Extended out patient EKG monitoring 13 days starting 06/12/2023:  Predominant Rhythm : Normal sinus rhythm. Min HR: 46 bpm at 3:14 AM. Max HR 132 bpm at 3:41 PM  Atrial arrhythmias: There were 7 brief atrial tachycardia episodes longest 11 beats at rate of 164 bpm.  1 wide-complex rhythm of 4 beats probably AT with aberrancy.  Atrial fibrillation: None  Ventricular arrhythmias: 4 beat wide-complex rhythm probably AT with aberrancy.  Rare PVCs and ventricular couplets and triplets constituting <1% each. PVC Burden <1% overall.  Heart Block: None  Symptoms: No symptoms reported.

## 2023-07-07 ENCOUNTER — Other Ambulatory Visit (HOSPITAL_COMMUNITY): Payer: Self-pay

## 2023-07-13 ENCOUNTER — Ambulatory Visit: Payer: 59 | Admitting: Cardiology

## 2023-07-13 ENCOUNTER — Encounter: Payer: Self-pay | Admitting: Cardiology

## 2023-07-13 ENCOUNTER — Other Ambulatory Visit (HOSPITAL_COMMUNITY): Payer: Self-pay

## 2023-07-13 VITALS — BP 107/68 | HR 57 | Resp 16 | Ht 69.0 in | Wt 189.4 lb

## 2023-07-13 DIAGNOSIS — R55 Syncope and collapse: Secondary | ICD-10-CM

## 2023-07-13 MED ORDER — METOPROLOL SUCCINATE ER 25 MG PO TB24
25.0000 mg | ORAL_TABLET | Freq: Every day | ORAL | 1 refills | Status: DC
Start: 2023-07-13 — End: 2024-01-26
  Filled 2023-07-13 – 2023-08-05 (×2): qty 90, 90d supply, fill #0
  Filled 2023-10-11 – 2023-11-04 (×2): qty 90, 90d supply, fill #1

## 2023-07-13 NOTE — Progress Notes (Signed)
Primary Physician/Referring:  Loyola Mast, MD  Patient ID: Jonathan Rivas, male    DOB: Mar 31, 1971, 52 y.o.   MRN: [id number]  Chief Complaint  Patient presents with   Loss of Consciousness   Follow-up   HPI:    PAVEL PEPITONE  is a 52 y.o.  Caucasian male patient with no significant prior cardiovascular history, morbid obesity SP gastric sleeve surgery in 2021 restless leg syndrome, presenting with syncope to the emergency room on 06/11/2023, was seen by me and felt to be vasovagal episodes, discharged home for extended EKG monitoring.  His echocardiogram was normal, he is accompanied by his husband at the bedside.  States that he is presently asymptomatic and has not had any recurrence and is resumed all his activities.  There is no family history of sudden cardiac death, patient symptoms have completely resolved.  He has maintained weight loss.  Past Medical History:  Diagnosis Date   Allergic rhinitis    Allergy    Arthritis    At risk for diabetes mellitus 10/28/2018   Blood transfusion without reported diagnosis    with bariatric surgery   Depression    Erectile dysfunction    Erectile dysfunction 04/10/2015   Hyperlipidemia 06/22/2019   Insomnia 12/28/2017   Morbid obesity (HCC) 04/10/2015   Numbness and tingling in both hands 09/25/2016   Obesity, morbid, BMI 40.0-49.9 (HCC)    Palpitations 09/25/2016   Pure hypercholesterolemia 06/22/2019   ASCVD 10-yr risk ~ 3% (2021)   Syncope and collapse 06/06/2022   Tobacco use    Tubular adenoma of colon 06/14/2021   Tubular adenoma colon x 1 (< 1 cm)   Urinary incontinence, post-void dribbling 10/29/2018   Past Surgical History:  Procedure Laterality Date   BUNIONECTOMY Right 2023   CARDIAC CATHETERIZATION N/A 10/03/2016   No CAD: Procedure: Left Heart Cath and Coronary Angiography;  Surgeon: Lennette Bihari, MD;  Location: [facility];  Service: Cardiovascular;  Laterality: N/A;   CHOLECYSTECTOMY N/A  09/23/2022   Procedure: LAPAROSCOPIC CHOLECYSTECTOMY WITH INTRAOPERATIVE CHOLANGIOGRAM;  Surgeon: Quentin Ore, MD;  Location: [facility];  Service: General;  Laterality: N/A;   COLONOSCOPY  06/07/2021   CYSTOSCOPY     Hematuria workup.  No recalled significant findings   LAPAROSCOPIC GASTRIC SLEEVE RESECTION N/A 11/28/2019   Procedure: LAPAROSCOPIC GASTRIC SLEEVE RESECTION, UPPER Endo, Eras Pathway;  Surgeon: Luretha Murphy, MD;  Location: [facility];  Service: General;  Laterality: N/A;   wisdom teeth     Family History  Problem Relation Age of Onset   Cancer Mother        Leukemia   Heart disease Father        Age 81's   Depression Brother    Alcohol abuse Brother    Drug abuse Brother    Asthma Maternal Aunt    Alcohol abuse Maternal Aunt    Heart disease Paternal Uncle    Heart disease Paternal Grandfather    Colon cancer Neg Hx    Colon polyps Neg Hx    Esophageal cancer Neg Hx    Stomach cancer Neg Hx    Rectal cancer Neg Hx     Social History   Tobacco Use   Smoking status: Every Day    Current packs/day: 0.00    Average packs/day: 0.5 packs/day for 30.0 years (15.0 ttl pk-yrs)    Types: Cigarettes    Start date: 04/27/1988    Last attempt to quit: 04/27/2018  Years since quitting: 5.2   Smokeless tobacco: Never   Tobacco comments:    Have quit in the past for short periods of time.  Substance Use Topics   Alcohol use: Not Currently   Marital Status: Married  ROS  Review of Systems  Cardiovascular:  Negative for chest pain, dyspnea on exertion and leg swelling.   Objective      07/13/2023    4:37 PM 06/22/2023    1:49 PM 06/12/2023    1:25 PM  Vitals with BMI  Height 5\' 9"  5\' 9"    Weight 189 lbs 6 oz 185 lbs 3 oz   BMI 27.96 27.34   Systolic 107 118 409  Diastolic 68 74 75  Pulse 57 68 75   Blood pressure 107/68, pulse (!) 57, resp. rate 16, height 5\' 9"  (1.753 m), weight 189 lb 6.4 oz (85.9 kg), SpO2 98%.  Physical Exam Neck:     Vascular: No  carotid bruit or JVD.  Cardiovascular:     Rate and Rhythm: Normal rate and regular rhythm.     Pulses: Intact distal pulses.     Heart sounds: Normal heart sounds. No murmur heard.    No gallop.  Pulmonary:     Effort: Pulmonary effort is normal.     Breath sounds: Normal breath sounds.  Abdominal:     General: Bowel sounds are normal.     Palpations: Abdomen is soft.  Musculoskeletal:     Right lower leg: No edema.     Left lower leg: No edema.    Laboratory examination:   Recent Labs    09/23/22 0402 03/04/23 0911 06/11/23 2118 06/12/23 0607  NA 141 143 144 138  K 3.9 4.6 3.9 3.8  CL 108 105 105 104  CO2 25 27 25 25   GLUCOSE 85 92 108* 95  BUN 8 16 12 6   CREATININE 0.68 0.83 0.85 0.77  CALCIUM 8.5* 9.5 8.8* 8.4*  GFRNONAA >60  --  >60 >60    Lab Results  Component Value Date   GLUCOSE 95 06/12/2023   NA 138 06/12/2023   K 3.8 06/12/2023   CL 104 06/12/2023   CO2 25 06/12/2023   BUN 6 06/12/2023   CREATININE 0.77 06/12/2023   GFRNONAA >60 06/12/2023   CALCIUM 8.4 (L) 06/12/2023   PHOS 3.7 09/23/2022   PROT 6.5 06/11/2023   ALBUMIN 3.7 06/11/2023   LABGLOB 3.1 10/28/2018   AGRATIO 1.5 10/28/2018   BILITOT 0.4 06/11/2023   ALKPHOS 81 06/11/2023   AST 71 (H) 06/11/2023   ALT 61 (H) 06/11/2023   ANIONGAP 9 06/12/2023      Lab Results  Component Value Date   ALT 61 (H) 06/11/2023   AST 71 (H) 06/11/2023   ALKPHOS 81 06/11/2023   BILITOT 0.4 06/11/2023       Latest Ref Rng & Units 06/12/2023    6:07 AM 06/11/2023    9:18 PM 09/23/2022    4:02 AM  CBC  WBC 4.0 - 10.5 K/uL 12.1  14.1  8.2   Hemoglobin 13.0 - 17.0 g/dL 81.1  91.4  78.2   Hematocrit 39.0 - 52.0 % 40.2  41.6  39.9   Platelets 150 - 400 K/uL 181  190  203        Latest Ref Rng & Units 06/11/2023    9:18 PM 03/04/2023    9:11 AM 09/23/2022    4:02 AM  Hepatic Function  Total Protein 6.5 - 8.1 g/dL  6.5  6.9  6.2   Albumin 3.5 - 5.0 g/dL 3.7  4.4  3.6   AST 15 - 41 U/L 71  18  91    ALT 0 - 44 U/L 61  16  124   Alk Phosphatase 38 - 126 U/L 81  57  72   Total Bilirubin 0.3 - 1.2 mg/dL 0.4  0.7  2.1     Lipid Panel Recent Labs    06/12/23 0607  CHOL 147  TRIG 63  LDLCALC 76  VLDL 13  HDL 58  CHOLHDL 2.5    HEMOGLOBIN A1C Lab Results  Component Value Date   HGBA1C 5.4 06/12/2023   MPG 108.28 06/12/2023   TSH Recent Labs    06/12/23 0607  TSH 0.539    Radiology:   CT Head Wo Contrast 06/11/2023 CLINICAL DATA:  Syncope EXAM: CT HEAD WITHOUT CONTRAST TECHNIQUE: Contiguous axial images were obtained from the base of the skull through the vertex without intravenous contrast. RADIATION DOSE REDUCTION: This exam was performed according to the departmental dose-optimization program which includes automated exposure control, adjustment of the mA and/or kV according to patient size and/or use of iterative reconstruction technique. COMPARISON:  None Available. FINDINGS: Brain: No evidence of acute infarction, hemorrhage, hydrocephalus, extra-axial collection or mass lesion/mass effect. Vascular: No hyperdense vessel or unexpected calcification. Skull: Normal. Negative for fracture or focal lesion. Sinuses/Orbits: No acute finding. Other: None IMPRESSION: Negative non contrasted CT appearance of the brain. Electronically Signed   By: Jasmine Pang M.D.   On: 06/11/2023 23:15    DG Chest 2 View 06/11/2023 CLINICAL DATA:  Recurrent syncope EXAM: CHEST - 2 VIEW COMPARISON:  07/08/2019 FINDINGS: Mild emphysematous changes are suggested in the lungs. Heart size and pulmonary vascularity are normal. No airspace disease or consolidation. No pleural effusions. No pneumothorax. Mediastinal contours appear intact. IMPRESSION: No active cardiopulmonary disease. Electronically Signed   By: Burman Nieves M.D.   On: 06/11/2023 21:52    Cardiac Studies:   Left Heart Catheterization 07/13/23:  Normal coronary arteries.  Right dominant circulation.  Normal EDP.     Zio Patch  Extended out patient EKG monitoring 13 days starting 06/12/2023:  Predominant Rhythm : Normal sinus rhythm. Min HR: 46 bpm at 3:14 AM. Max HR 132 bpm at 3:41 PM  Atrial arrhythmias: There were 7 brief atrial tachycardia episodes longest 11 beats at rate of 164 bpm.  1 wide-complex rhythm of 4 beats probably AT with aberrancy.  Atrial fibrillation: None  Ventricular arrhythmias: 4 beat wide-complex rhythm probably AT with aberrancy.  Rare PVCs and ventricular couplets and triplets constituting <1% each. PVC Burden <1% overall.  Heart Block: None  Symptoms: No symptoms reported.  Echocardiogram 06/12/2023:  1. Left ventricular ejection fraction, by estimation, is 70 to 75%. The left ventricle has hyperdynamic function. The left ventricle has no regional wall motion abnormalities. Left ventricular diastolic parameters were normal.  2. Right ventricular systolic function is normal. The right ventricular size is normal.  3. The mitral valve is normal in structure. No evidence of mitral valve regurgitation. No evidence of mitral stenosis.  4. The aortic valve is normal in structure. Aortic valve regurgitation is not visualized. No aortic stenosis is present.  5. The inferior vena cava is normal in size with greater than 50% respiratory variability, suggesting right atrial pressure of 3 mmHg.  EKG:   EKG 06/11/2023: Normal sinus rhythm, ventricular rate 64 bpm. Poor R progression, probably normal variant. No evidence of  ischemia, normal QT interval.   Medications and allergies   Allergies  Allergen Reactions   Nsaids Other (See Comments)    Hx of gastric bypass     Medication list   Current Outpatient Medications:    Calcium 500-2.5 MG-MCG CHEW, Chew 1 tablet by mouth in the morning, at noon, and at bedtime., Disp: , Rfl:    loratadine (CLARITIN) 10 MG tablet, Take 10 mg by mouth at bedtime., Disp: , Rfl:    metoprolol succinate (TOPROL-XL) 25 MG 24 hr tablet, Take 1 tablet (25 mg total)  by mouth daily., Disp: 30 tablet, Rfl: 1   Multiple Vitamins-Minerals (BARIATRIC MULTIVITAMINS/IRON) CAPS, Take 1 capsule by mouth daily., Disp: , Rfl:    rOPINIRole (REQUIP) 2 MG tablet, Take 1 tablet (2 mg total) by mouth at bedtime., Disp: 90 tablet, Rfl: 3  Assessment     ICD-10-CM   1. Vasovagal syncope  R55        No orders of the defined types were placed in this encounter.   No orders of the defined types were placed in this encounter.   There are no discontinued medications.   Recommendations:   AKILI ANDUJO is a 52 y.o.  Caucasian male patient with no significant prior cardiovascular history, morbid obesity SP gastric sleeve surgery in 2021 restless leg syndrome, presenting with syncope to the emergency room on 06/11/2023, was seen by me and felt to be vasovagal episodes, discharged home for extended EKG monitoring.  1. Vasovagal syncope Patient's symptoms clearly occurring with pain, or when he went to the bathroom and had 3 more episodes of syncope I will suggest vasovagal episodes.  He is now on a low-dose beta-blocker therapy, not had any recurrence and completely asymptomatic.  I reviewed the results of the Zio patch monitoring, asymptomatic.  He has brief atrial tachycardia episodes which are nonspecific.  Advised him to try B1, B6 and B12 supplement empirically and continue low-dose beta-blocker therapy for now, can be weaned off for the next 6 months.  I do not think he needs any further evaluation given normal echocardiogram, patient continues to exercise, has maintained weight loss, normal blood pressure and normal lipids.  Do not think he needs stress testing either as he is active and there is no family history of sudden cardiac death.  I will see him back on a as needed basis.  Patient is aware of counterpressure maneuvers and avoidance of fall. - metoprolol succinate (TOPROL-XL) 25 MG 24 hr tablet; Take 1 tablet (25 mg total) by mouth daily.  Dispense: 90  tablet; Refill: 1     Yates Decamp, MD, [facility] 07/13/2023, 5:03 PM Office: [phone]

## 2023-07-14 ENCOUNTER — Ambulatory Visit: Payer: 59 | Admitting: Cardiology

## 2023-07-14 ENCOUNTER — Other Ambulatory Visit (HOSPITAL_COMMUNITY): Payer: Self-pay

## 2023-07-21 ENCOUNTER — Other Ambulatory Visit (HOSPITAL_COMMUNITY): Payer: Self-pay

## 2023-08-05 ENCOUNTER — Other Ambulatory Visit (HOSPITAL_COMMUNITY): Payer: Self-pay

## 2023-08-06 ENCOUNTER — Other Ambulatory Visit (HOSPITAL_COMMUNITY): Payer: Self-pay

## 2023-08-22 ENCOUNTER — Telehealth: Payer: 59 | Admitting: Family Medicine

## 2023-08-22 DIAGNOSIS — J4540 Moderate persistent asthma, uncomplicated: Secondary | ICD-10-CM

## 2023-08-22 MED ORDER — PREDNISONE 20 MG PO TABS: 20.0000 mg | ORAL_TABLET | Freq: Two times a day (BID) | ORAL | 0 refills | Status: AC

## 2023-08-22 MED ORDER — BENZONATATE 200 MG PO CAPS: 200.0000 mg | ORAL_CAPSULE | Freq: Two times a day (BID) | ORAL | 0 refills | Status: AC | PRN

## 2023-08-22 MED ORDER — AZITHROMYCIN 250 MG PO TABS: ORAL_TABLET | ORAL | 0 refills | Status: AC

## 2023-08-22 NOTE — Progress Notes (Signed)
Virtual Visit Consent   Jonathan Rivas, you are scheduled for a virtual visit with a [facility] provider today. Just as with appointments in the office, your consent must be obtained to participate. Your consent will be active for this visit and any virtual visit you may have with one of our providers in the next 365 days. If you have a MyChart account, a copy of this consent can be sent to you electronically.  As this is a virtual visit, video technology does not allow for your provider to perform a traditional examination. This may limit your provider's ability to fully assess your condition. If your provider identifies any concerns that need to be evaluated in person or the need to arrange testing (such as labs, EKG, etc.), we will make arrangements to do so. Although advances in technology are sophisticated, we cannot ensure that it will always work on either your end or our end. If the connection with a video visit is poor, the visit may have to be switched to a telephone visit. With either a video or telephone visit, we are not always able to ensure that we have a secure connection.  By engaging in this virtual visit, you consent to the provision of healthcare and authorize for your insurance to be billed (if applicable) for the services provided during this visit. Depending on your insurance coverage, you may receive a charge related to this service.  I need to obtain your verbal consent now. Are you willing to proceed with your visit today? Jonathan Rivas has provided verbal consent on 08/22/2023 for a virtual visit (video or telephone). Jonathan Curio, FNP  Date: 08/22/2023 8:50 AM  Virtual Visit via Video Note   I, Jonathan Rivas, connected with  Jonathan Rivas  ([id number], 1970/12/14) on 08/22/23 at  8:45 AM EDT by a video-enabled telemedicine application and verified that I am speaking with the correct person using two identifiers.  Location: Patient: Virtual Visit Location Patient:  Home Provider: Virtual Visit Location Provider: Home Office   I discussed the limitations of evaluation and management by telemedicine and the availability of in person appointments. The patient expressed understanding and agreed to proceed.    History of Present Illness: Jonathan Rivas is a 52 y.o. who identifies as a male who was assigned male at birth, and is being seen today for cough, wheezing, no fever, yellow mucus, sx for 2 weeks. Marland Kitchen  HPI: HPI  Problems:  Patient Active Problem List   Diagnosis Date Noted   Syncope 06/12/2023   Allergic rhinitis    S/P laparoscopic sleeve gastrectomy 11/28/2019   Major depression in complete remission (HCC) 04/10/2015   Restless leg syndrome 04/10/2015   Tobacco use disorder 04/10/2015    Allergies:  Allergies  Allergen Reactions   Nsaids Other (See Comments)    Hx of gastric bypass    Medications:  Current Outpatient Medications:    Calcium 500-2.5 MG-MCG CHEW, Chew 1 tablet by mouth in the morning, at noon, and at bedtime., Disp: , Rfl:    loratadine (CLARITIN) 10 MG tablet, Take 10 mg by mouth at bedtime., Disp: , Rfl:    metoprolol succinate (TOPROL-XL) 25 MG 24 hr tablet, Take 1 tablet (25 mg total) by mouth daily., Disp: 90 tablet, Rfl: 1   Multiple Vitamins-Minerals (BARIATRIC MULTIVITAMINS/IRON) CAPS, Take 1 capsule by mouth daily., Disp: , Rfl:    rOPINIRole (REQUIP) 2 MG tablet, Take 1 tablet (2 mg total) by mouth at bedtime., Disp:  90 tablet, Rfl: 3  Observations/Objective: Patient is well-developed, well-nourished in no acute distress.  Resting comfortably  at home.  Head is normocephalic, atraumatic.  No labored breathing.  Speech is clear and coherent with logical content.  Patient is alert and oriented at baseline.    Assessment and Plan: 1. Moderate persistent asthmatic bronchitis without complication  Increase fluids humidifier at night, tylenol as directed, UC if sx worsen.   Follow Up Instructions: I  discussed the assessment and treatment plan with the patient. The patient was provided an opportunity to ask questions and all were answered. The patient agreed with the plan and demonstrated an understanding of the instructions.  A copy of instructions were sent to the patient via MyChart unless otherwise noted below.     The patient was advised to call back or seek an in-person evaluation if the symptoms worsen or if the condition fails to improve as anticipated.  Time:  I spent 10 minutes with the patient via telehealth technology discussing the above problems/concerns.    Jonathan Curio, FNP

## 2023-08-22 NOTE — Patient Instructions (Signed)
Bronchiectasis  Bronchiectasis is a condition in which the airways in the lungs (bronchi) are damaged and widened. The condition makes it hard for the lungs to get rid of mucus, and it causes mucus to gather in the bronchi. This condition often leads to lung infections, which can make the condition worse. What are the causes? You can be born with this condition, or you can develop it later in life. Common causes of this condition include: Cystic fibrosis. Repeated lung infections, such as pneumonia or tuberculosis. An object or other blockage in the lungs. Breathing in fluid, food, or other objects (aspiration). A problem with the body's defense system (immune system) and lung structure that is present at birth (congenital). Sometimes the cause is not known. What are the signs or symptoms? Common symptoms of this condition include: A daily cough that brings up mucus and lasts for more than 3 weeks. Lung infections that happen often. Shortness of breath and wheezing. Weakness and feeling tired (fatigue). How is this diagnosed? This condition is diagnosed with tests, such as: Chest X-rays or CT scans. These are done to check for changes in the lungs. Breathing tests. These are done to check how well your lungs are working. A test of a sample of your saliva (sputum culture). This test is done to check for infection. Blood tests and other tests. These are done to check for related diseases or causes. How is this treated? Treatment for this condition depends on the severity of the illness and its cause. Treatment may include: Medicines that loosen mucus so it can be coughed up (mucolytics). Medicines that relax the muscles of the bronchi (bronchodilators). Antibiotic medicines to prevent or treat infection. Physical therapy to help clear mucus from the lungs. Techniques may include: Postural drainage. This is when you sit or lie in certain positions so that mucus can drain by gravity. Chest  percussion. This involves tapping the chest or back with a cupped hand. Chest vibration. For this therapy, a hand or special equipment vibrates your chest and back. Surgery to remove the affected part of the lung. This may be done in severe cases. Follow these instructions at home: Medicines Take over-the-counter and prescription medicines only as told by your health care provider. If you were prescribed an antibiotic medicine, take it as told by your health care provider. Do not stop taking the antibiotic even if you start to feel better. Avoid taking sedatives and antihistamines unless your health care provider tells you to take them. These medicines tend to thicken the mucus in the lungs. Managing symptoms Do breathing exercises or techniques to clear your lungs as told by your health care provider. Consider using a cold steam vaporizer or humidifier in your room or home to help loosen secretions. If you have a cough that gets worse at night, try sleeping in a semi-upright position. General instructions Get plenty of rest. Drink enough fluid to keep your urine pale yellow. Stay inside when pollution and ozone levels are high. Stay up to date with vaccinations and immunizations. Avoid cigarette smoke and other lung irritants. Do not use any products that contain nicotine or tobacco. These products include cigarettes, chewing tobacco, and vaping devices, such as e-cigarettes. If you need help quitting, ask your health care provider. Keep all follow-up visits. This is important. Contact a health care provider if: You cough up more sputum than before and the sputum is yellow or green in color. You have a fever or chills. You cannot control your  cough and are losing sleep. Get help right away if: You cough up blood. You have chest pain. You have increasing shortness of breath. You have pain that gets worse or is not controlled with medicines. You have a fever and your symptoms suddenly get  worse. These symptoms may be an emergency. Get help right away. Call 911. Do not wait to see if the symptoms will go away. Do not drive yourself to the hospital. Summary Bronchiectasis is a condition in which the airways in the lungs (bronchi) are damaged and widened. The condition makes it hard for the lungs to get rid of mucus, and it causes mucus to gather in the bronchi. Treatment usually includes therapy to help clear mucus from the lungs. Avoid cigarette smoke and other lung irritants. Stay up to date with vaccinations and immunizations. This information is not intended to replace advice given to you by your health care provider. Make sure you discuss any questions you have with your health care provider. Document Revised: 07/24/2021 Document Reviewed: 06/11/2021 Elsevier Patient Education  2024 [employer].

## 2023-10-12 ENCOUNTER — Other Ambulatory Visit: Payer: Self-pay

## 2023-11-04 ENCOUNTER — Other Ambulatory Visit (HOSPITAL_COMMUNITY): Payer: Self-pay

## 2023-12-24 ENCOUNTER — Telehealth: Payer: Commercial Managed Care - PPO | Admitting: Family Medicine

## 2023-12-24 DIAGNOSIS — U071 COVID-19: Secondary | ICD-10-CM | POA: Diagnosis not present

## 2023-12-24 MED ORDER — MOLNUPIRAVIR EUA 200MG CAPSULE
4.0000 | ORAL_CAPSULE | Freq: Two times a day (BID) | ORAL | 0 refills | Status: AC
Start: 2023-12-24 — End: 2023-12-29

## 2023-12-24 NOTE — Progress Notes (Signed)
Virtual Visit Consent   READE TREFZ, you are scheduled for a virtual visit with a [facility] provider today. Just as with appointments in the office, your consent must be obtained to participate. Your consent will be active for this visit and any virtual visit you may have with one of our providers in the next 365 days. If you have a MyChart account, a copy of this consent can be sent to you electronically.  As this is a virtual visit, video technology does not allow for your provider to perform a traditional examination. This may limit your provider's ability to fully assess your condition. If your provider identifies any concerns that need to be evaluated in person or the need to arrange testing (such as labs, EKG, etc.), we will make arrangements to do so. Although advances in technology are sophisticated, we cannot ensure that it will always work on either your end or our end. If the connection with a video visit is poor, the visit may have to be switched to a telephone visit. With either a video or telephone visit, we are not always able to ensure that we have a secure connection.  By engaging in this virtual visit, you consent to the provision of healthcare and authorize for your insurance to be billed (if applicable) for the services provided during this visit. Depending on your insurance coverage, you may receive a charge related to this service.  I need to obtain your verbal consent now. Are you willing to proceed with your visit today? SHAH INSLEY has provided verbal consent on 12/24/2023 for a virtual visit (video or telephone). Freddy Finner, NP  Date: 12/24/2023 11:26 AM  Virtual Visit via Video Note   I, Freddy Finner, connected with  Jonathan Rivas  ([id number], 03-24-1971) on 12/24/23 at 11:30 AM EST by a video-enabled telemedicine application and verified that I am speaking with the correct person using two identifiers.  Location: Patient: Virtual Visit Location  Patient: Home Provider: Virtual Visit Location Provider: Home Office   I discussed the limitations of evaluation and management by telemedicine and the availability of in person appointments. The patient expressed understanding and agreed to proceed.    History of Present Illness: Jonathan Rivas is a 53 y.o. who identifies as a male who was assigned male at birth, and is being seen today for COVID  Onset was Tues with weakness and not feeling well, woke up on Wednesday worse with cough, congestion,  Associated symptoms are a headache, body aches, ear pain, chills.  Modifying factors are tylenol  Denies chest pain, shortness of breath  Exposure to sick contacts- known COVID test: +    Problems:  Patient Active Problem List   Diagnosis Date Noted   Syncope 06/12/2023   Allergic rhinitis    S/P laparoscopic sleeve gastrectomy 11/28/2019   Major depression in complete remission (HCC) 04/10/2015   Restless leg syndrome 04/10/2015   Tobacco use disorder 04/10/2015    Allergies:  Allergies  Allergen Reactions   Nsaids Other (See Comments)    Hx of gastric bypass    Medications:  Current Outpatient Medications:    Calcium 500-2.5 MG-MCG CHEW, Chew 1 tablet by mouth in the morning, at noon, and at bedtime., Disp: , Rfl:    loratadine (CLARITIN) 10 MG tablet, Take 10 mg by mouth at bedtime., Disp: , Rfl:    metoprolol succinate (TOPROL-XL) 25 MG 24 hr tablet, Take 1 tablet (25 mg total) by mouth daily.,  Disp: 90 tablet, Rfl: 1   Multiple Vitamins-Minerals (BARIATRIC MULTIVITAMINS/IRON) CAPS, Take 1 capsule by mouth daily., Disp: , Rfl:    rOPINIRole (REQUIP) 2 MG tablet, Take 1 tablet (2 mg total) by mouth at bedtime., Disp: 90 tablet, Rfl: 3  Observations/Objective: Patient is well-developed, well-nourished in no acute distress.  Resting comfortably  at home.  Head is normocephalic, atraumatic.  No labored breathing.  Speech is clear and coherent with logical content.   Patient is alert and oriented at baseline.    Assessment and Plan:  1. COVID (Primary)  - molnupiravir EUA (LAGEVRIO) 200 mg CAPS capsule; Take 4 capsules (800 mg total) by mouth 2 (two) times daily for 5 days.  Dispense: 40 capsule; Refill: 0  - Continue OTC symptomatic management of choice - Info for COVID sent on AVS as well - Take prescribed medications as directed - Push fluids - Rest as needed - Discussed return precautions and when to seek in-person evaluation, sent via AVS as well  Reviewed side effects, risks and benefits of medication.    Patient acknowledged agreement and understanding of the plan.   Past Medical, Surgical, Social History, Allergies, and Medications have been Reviewed.    Follow Up Instructions: I discussed the assessment and treatment plan with the patient. The patient was provided an opportunity to ask questions and all were answered. The patient agreed with the plan and demonstrated an understanding of the instructions.  A copy of instructions were sent to the patient via MyChart unless otherwise noted below.     The patient was advised to call back or seek an in-person evaluation if the symptoms worsen or if the condition fails to improve as anticipated.    Freddy Finner, NP

## 2023-12-24 NOTE — Patient Instructions (Addendum)
[name], thank you for joining Freddy Finner, NP for today's virtual visit.  While this provider is not your primary care provider (PCP), if your PCP is located in our provider database this encounter information will be shared with them immediately following your visit.   A [facility] MyChart account gives you access to today's visit and all your visits, tests, and labs performed at [facility] " click here if you don't have a [facility] MyChart account or go to [email or web address]  Consent: (Patient) Jonathan Rivas provided verbal consent for this virtual visit at the beginning of the encounter.  Current Medications:  Current Outpatient Medications:    molnupiravir EUA (LAGEVRIO) 200 mg CAPS capsule, Take 4 capsules (800 mg total) by mouth 2 (two) times daily for 5 days., Disp: 40 capsule, Rfl: 0   Calcium 500-2.5 MG-MCG CHEW, Chew 1 tablet by mouth in the morning, at noon, and at bedtime., Disp: , Rfl:    loratadine (CLARITIN) 10 MG tablet, Take 10 mg by mouth at bedtime., Disp: , Rfl:    metoprolol succinate (TOPROL-XL) 25 MG 24 hr tablet, Take 1 tablet (25 mg total) by mouth daily., Disp: 90 tablet, Rfl: 1   Multiple Vitamins-Minerals (BARIATRIC MULTIVITAMINS/IRON) CAPS, Take 1 capsule by mouth daily., Disp: , Rfl:    rOPINIRole (REQUIP) 2 MG tablet, Take 1 tablet (2 mg total) by mouth at bedtime., Disp: 90 tablet, Rfl: 3   Medications ordered in this encounter:  Meds ordered this encounter  Medications   molnupiravir EUA (LAGEVRIO) 200 mg CAPS capsule    Sig: Take 4 capsules (800 mg total) by mouth 2 (two) times daily for 5 days.    Dispense:  40 capsule    Refill:  0    Supervising Provider:   Merrilee Jansky [[id number]]     *If you need refills on other medications prior to your next appointment, please contact your pharmacy*  Follow-Up: Call back or seek an in-person evaluation if the symptoms worsen or if the condition fails to improve as  anticipated.  [facility] [phone]  Care Instructions:  Isolation Instructions: You are to isolate at home until you have been fever free for at least 24 hours without a fever-reducing medication, and symptoms have been steadily improving for 24 hours. At that time,  you can end isolation but need to mask for an additional 5 days.   If you must be around other household members who do not have symptoms, you need to make sure that both you and the family members are masking consistently with a high-quality mask.  If you note any worsening of symptoms despite treatment, please seek an in-person evaluation ASAP. If you note any significant shortness of breath or any chest pain, please seek ER evaluation. Please do not delay care!   COVID-19: What to Do if You Are Sick If you test positive and are an older adult or someone who is at high risk of getting very sick from COVID-19, treatment may be available. Contact a healthcare provider right away after a positive test to determine if you are eligible, even if your symptoms are mild right now. You can also visit a Test to Treat location and, if eligible, receive a prescription from a provider. Don't delay: Treatment must be started within the first few days to be effective. If you have a fever, cough, or other symptoms, you might have COVID-19. Most people have mild illness and are  able to recover at home. If you are sick: Keep track of your symptoms. If you have an emergency warning sign (including trouble breathing), call 911. Steps to help prevent the spread of COVID-19 if you are sick If you are sick with COVID-19 or think you might have COVID-19, follow the steps below to care for yourself and to help protect other people in your home and community. Stay home except to get medical care Stay home. Most people with COVID-19 have mild illness and can recover at home without medical care. Do not leave your home, except to get  medical care. Do not visit public areas and do not go to places where you are unable to wear a mask. Take care of yourself. Get rest and stay hydrated. Take over-the-counter medicines, such as acetaminophen, to help you feel better. Stay in touch with your doctor. Call before you get medical care. Be sure to get care if you have trouble breathing, or have any other emergency warning signs, or if you think it is an emergency. Avoid public transportation, ride-sharing, or taxis if possible. Get tested If you have symptoms of COVID-19, get tested. While waiting for test results, stay away from others, including staying apart from those living in your household. Get tested as soon as possible after your symptoms start. Treatments may be available for people with COVID-19 who are at risk for becoming very sick. Don't delay: Treatment must be started early to be effective--some treatments must begin within 5 days of your first symptoms. Contact your healthcare provider right away if your test result is positive to determine if you are eligible. Self-tests are one of several options for testing for the virus that causes COVID-19 and may be more convenient than laboratory-based tests and point-of-care tests. Ask your healthcare provider or your local health department if you need help interpreting your test results. You can visit your state, tribal, local, and territorial health department's website to look for the latest local information on testing sites. Separate yourself from other people As much as possible, stay in a specific room and away from other people and pets in your home. If possible, you should use a separate bathroom. If you need to be around other people or animals in or outside of the home, wear a well-fitting mask. Tell your close contacts that they may have been exposed to COVID-19. An infected person can spread COVID-19 starting 48 hours (or 2 days) before the person has any symptoms or tests  positive. By letting your close contacts know they may have been exposed to COVID-19, you are helping to protect everyone. See COVID-19 and Animals if you have questions about pets. If you are diagnosed with COVID-19, someone from the health department may call you. Answer the call to slow the spread. Monitor your symptoms Symptoms of COVID-19 include fever, cough, or other symptoms. Follow care instructions from your healthcare provider and local health department. Your local health authorities may give instructions on checking your symptoms and reporting information. When to seek emergency medical attention Look for emergency warning signs* for COVID-19. If someone is showing any of these signs, seek emergency medical care immediately: Trouble breathing Persistent pain or pressure in the chest New confusion Inability to wake or stay awake Pale, gray, or blue-colored skin, lips, or nail beds, depending on skin tone *This list is not all possible symptoms. Please call your medical provider for any other symptoms that are severe or concerning to you. Call 911 or call  ahead to your local emergency facility: Notify the operator that you are seeking care for someone who has or may have COVID-19. Call ahead before visiting your doctor Call ahead. Many medical visits for routine care are being postponed or done by phone or telemedicine. If you have a medical appointment that cannot be postponed, call your doctor's office, and tell them you have or may have COVID-19. This will help the office protect themselves and other patients. If you are sick, wear a well-fitting mask You should wear a mask if you must be around other people or animals, including pets (even at home). Wear a mask with the best fit, protection, and comfort for you. You don't need to wear the mask if you are alone. If you can't put on a mask (because of trouble breathing, for example), cover your coughs and sneezes in some other way.  Try to stay at least 6 feet away from other people. This will help protect the people around you. Masks should not be placed on young children under age 60 years, anyone who has trouble breathing, or anyone who is not able to remove the mask without help. Cover your coughs and sneezes Cover your mouth and nose with a tissue when you cough or sneeze. Throw away used tissues in a lined trash can. Immediately wash your hands with soap and water for at least 20 seconds. If soap and water are not available, clean your hands with an alcohol-based hand sanitizer that contains at least 60% alcohol. Clean your hands often Wash your hands often with soap and water for at least 20 seconds. This is especially important after blowing your nose, coughing, or sneezing; going to the bathroom; and before eating or preparing food. Use hand sanitizer if soap and water are not available. Use an alcohol-based hand sanitizer with at least 60% alcohol, covering all surfaces of your hands and rubbing them together until they feel dry. Soap and water are the best option, especially if hands are visibly dirty. Avoid touching your eyes, nose, and mouth with unwashed hands. Handwashing Tips Avoid sharing personal household items Do not share dishes, drinking glasses, cups, eating utensils, towels, or bedding with other people in your home. Wash these items thoroughly after using them with soap and water or put in the dishwasher. Clean surfaces in your home regularly Clean and disinfect high-touch surfaces (for example, doorknobs, tables, handles, light switches, and countertops) in your "sick room" and bathroom. In shared spaces, you should clean and disinfect surfaces and items after each use by the person who is ill. If you are sick and cannot clean, a caregiver or other person should only clean and disinfect the area around you (such as your bedroom and bathroom) on an as needed basis. Your caregiver/other person should wait  as long as possible (at least several hours) and wear a mask before entering, cleaning, and disinfecting shared spaces that you use. Clean and disinfect areas that may have blood, stool, or body fluids on them. Use household cleaners and disinfectants. Clean visible dirty surfaces with household cleaners containing soap or detergent. Then, use a household disinfectant. Use a product from [employer] List N: Disinfectants for Coronavirus (COVID-19). Be sure to follow the instructions on the label to ensure safe and effective use of the product. Many products recommend keeping the surface wet with a disinfectant for a certain period of time (look at "contact time" on the product label). You may also need to wear personal protective equipment, such  as gloves, depending on the directions on the product label. Immediately after disinfecting, wash your hands with soap and water for 20 seconds. For completed guidance on cleaning and disinfecting your home, visit Complete Disinfection Guidance. Take steps to improve ventilation at home Improve ventilation (air flow) at home to help prevent from spreading COVID-19 to other people in your household. Clear out COVID-19 virus particles in the air by opening windows, using air filters, and turning on fans in your home. Use this interactive tool to learn how to improve air flow in your home. When you can be around others after being sick with COVID-19 Deciding when you can be around others is different for different situations. Find out when you can safely end home isolation. For any additional questions about your care, contact your healthcare provider or state or local health department. 02/12/2021 Content source: [facility] ([facility]), Division of Viral Diseases This information is not intended to replace advice given to you by your health care provider. Make sure you discuss any questions you have with your health care  provider. Document Revised: 03/28/2021 Document Reviewed: 03/28/2021 Elsevier Patient Education  2022 [employer].     If you have been instructed to have an in-person evaluation today at a local Urgent Care facility, please use the link below. It will take you to a list of all of our available [facility], including address, phone number and hours of operation. Please do not delay care.  [facility]  If you or a family member do not have a primary care provider, use the link below to schedule a visit and establish care. When you choose a [facility] primary care physician or advanced practice provider, you gain a long-term partner in health. Find a Primary Care Provider  Learn more about [facility]'s in-office and virtual care options: [facility] - Get Care Now

## 2024-01-04 ENCOUNTER — Other Ambulatory Visit (HOSPITAL_COMMUNITY): Payer: Self-pay

## 2024-01-18 ENCOUNTER — Telehealth: Payer: Self-pay | Admitting: Family Medicine

## 2024-01-18 ENCOUNTER — Ambulatory Visit: Payer: Self-pay | Admitting: Family Medicine

## 2024-01-18 NOTE — Telephone Encounter (Signed)
 FYI: This call has been transferred to triage nurse: the Triage Nurse. Once the result note has been entered staff can address the message at that time.  Patient called in with the following symptoms:  Red Word:Pt scheduled via mychart appt for Left side pain going to his Left arm when he breathe in deep. I called and transferred pt over to nurse triage.    Please advise at [facility] [phone]  Message is routed to Provider Pool.

## 2024-01-18 NOTE — Telephone Encounter (Signed)
 Please see triage note below. Patient scheduled with Dr. Veto Kemps for tomorrow, 01/19/24 at 4pm.

## 2024-01-18 NOTE — Telephone Encounter (Signed)
 Noted. Dm/cma

## 2024-01-18 NOTE — Telephone Encounter (Signed)
  Chief Complaint: L side chest wall pain Symptoms: pain, cough Frequency: 3 days Pertinent Negatives: Patient denies SOB, fever, sweating Disposition: [] ED /[] Urgent Care (no appt availability in office) / [x] Appointment(In office/virtual)/ []  [facility]/ [] Home Care/ [] Refused Recommended Disposition /[]  Mobile Bus/ []  Follow-up with PCP Additional Notes: Call transferred from [facility] clinic via [facility] for triage. Patient reporting L side chest wall pain x 3 days. States that he is recovering from covid and has had a lingering cough, also reports he slipped previously and feels he caught himself with the L arm. Per protocol, patient to be evaluated within 3 days. Patient scheduled appt through my chart for tomorrow at 1600 Care advice reviewed, patient verbalized understandingand denies further questions at this time. Alerting PCP for review.    Reason for Disposition  [1] Chest pain(s) lasting a few seconds from coughing AND [2] persists > 3 days  Answer Assessment - Initial Assessment Questions 1. LOCATION: "Where does it hurt?"       L lower rib cage, up by shoulder with very deep breath x3 days 2. RADIATION: "Does the pain go anywhere else?" (e.g., into neck, jaw, arms, back)     Denies 3. ONSET: "When did the chest pain begin?" (Minutes, hours or days)      3 days 4. PATTERN: "Does the pain come and go, or has it been constant since it started?"  "Does it get worse with exertion?"      Intermittent, occurs with deep breath 5. DURATION: "How long does it last" (e.g., seconds, minutes, hours)     Quick sharp pain 6. SEVERITY: "How bad is the pain?"  (e.g., Scale 1-10; mild, moderate, or severe)    - MILD (1-3): doesn't interfere with normal activities     - MODERATE (4-7): interferes with normal activities or awakens from sleep    - SEVERE (8-10): excruciating pain, unable to do any normal activities       Denies pain at this time, when it occurs reports  6/10 7. CARDIAC RISK FACTORS: "Do you have any history of heart problems or risk factors for heart disease?" (e.g., angina, prior heart attack; diabetes, high blood pressure, high cholesterol, smoker, or strong family history of heart disease)     Smoker 8. PULMONARY RISK FACTORS: "Do you have any history of lung disease?"  (e.g., blood clots in lung, asthma, emphysema, birth control pills)     Denies 9. CAUSE: "What do you think is causing the chest pain?"     Long covid 10. OTHER SYMPTOMS: "Do you have any other symptoms?" (e.g., dizziness, nausea, vomiting, sweating, fever, difficulty breathing, cough)       Cough, lingering but not constant.  Protocols used: Chest Pain-A-AH

## 2024-01-19 ENCOUNTER — Ambulatory Visit: Payer: Commercial Managed Care - PPO | Admitting: Family Medicine

## 2024-01-19 VITALS — BP 124/76 | HR 82 | Temp 98.2°F | Ht 69.0 in | Wt 190.8 lb

## 2024-01-19 DIAGNOSIS — R0782 Intercostal pain: Secondary | ICD-10-CM

## 2024-01-19 MED ORDER — NAPROXEN 500 MG PO TABS
500.0000 mg | ORAL_TABLET | Freq: Two times a day (BID) | ORAL | 0 refills | Status: DC
Start: 2024-01-19 — End: 2024-07-04

## 2024-01-19 NOTE — Progress Notes (Signed)
 [facility] [address] [place] [address] Dept: [phone] Dept Fax: [phone]  Office Visit  Subjective:    Patient ID: Jonathan Rivas, male    DOB: Jun 12, 1971, 53 y.o..   MRN: [id number]  Chief Complaint  Patient presents with   Shoulder Pain    C/o having LT shoulder/side pain.  Fell 1 week ago.     History of Present Illness:  Patient is in today for with a recent complaint of left chest wall pain. Jonathan Rivas notes he had COVID in Jan. He completed a course of Paxlovid and fairly quickly recovered, though he had felt very ill. He did have some residual cough, which has improved. On Sat., he leaned to the right over a chair arm to lift his dog. He had sudden onset of left chest pain. This was sharp and was associated with some left apical shoulder pain. He has been taking ibuprofen and Tylenol, but still notes some pain, esp. with deep breathing and certain movements.  Past Medical History: Patient Active Problem List   Diagnosis Date Noted   Syncope 06/12/2023   Allergic rhinitis    S/P laparoscopic sleeve gastrectomy 11/28/2019   Major depression in complete remission (HCC) 04/10/2015   Restless leg syndrome 04/10/2015   Tobacco use disorder 04/10/2015   Past Surgical History:  Procedure Laterality Date   BUNIONECTOMY Right 2023   CARDIAC CATHETERIZATION N/A 10/03/2016   No CAD: Procedure: Left Heart Cath and Coronary Angiography;  Surgeon: Lennette Bihari, MD;  Location: [facility];  Service: Cardiovascular;  Laterality: N/A;   CHOLECYSTECTOMY N/A 09/23/2022   Procedure: LAPAROSCOPIC CHOLECYSTECTOMY WITH INTRAOPERATIVE CHOLANGIOGRAM;  Surgeon: Quentin Ore, MD;  Location: [facility];  Service: General;  Laterality: N/A;   COLONOSCOPY  06/07/2021   CYSTOSCOPY     Hematuria workup.  No recalled significant findings   LAPAROSCOPIC GASTRIC SLEEVE RESECTION N/A 11/28/2019   Procedure:  LAPAROSCOPIC GASTRIC SLEEVE RESECTION, UPPER Endo, Eras Pathway;  Surgeon: Luretha Murphy, MD;  Location: [facility];  Service: General;  Laterality: N/A;   wisdom teeth     Family History  Problem Relation Age of Onset   Cancer Mother        Leukemia   Heart disease Father        Age 3's   Depression Brother    Alcohol abuse Brother    Drug abuse Brother    Asthma Maternal Aunt    Alcohol abuse Maternal Aunt    Heart disease Paternal Uncle    Heart disease Paternal Grandfather    Colon cancer Neg Hx    Colon polyps Neg Hx    Esophageal cancer Neg Hx    Stomach cancer Neg Hx    Rectal cancer Neg Hx    Outpatient Medications Prior to Visit  Medication Sig Dispense Refill   Calcium 500-2.5 MG-MCG CHEW Chew 1 tablet by mouth in the morning, at noon, and at bedtime.     loratadine (CLARITIN) 10 MG tablet Take 10 mg by mouth at bedtime.     metoprolol succinate (TOPROL-XL) 25 MG 24 hr tablet Take 1 tablet (25 mg total) by mouth daily. 90 tablet 1   Multiple Vitamins-Minerals (BARIATRIC MULTIVITAMINS/IRON) CAPS Take 1 capsule by mouth daily.     rOPINIRole (REQUIP) 2 MG tablet Take 1 tablet (2 mg total) by mouth at bedtime. 90 tablet 3   No facility-administered medications prior to visit.   Allergies  Allergen  Reactions   Nsaids Other (See Comments)    Hx of gastric bypass      Objective:   Today's Vitals   01/19/24 1602  BP: 124/76  Pulse: 82  Temp: 98.2 F (36.8 C)  TempSrc: Temporal  SpO2: 98%  Weight: 190 lb 12.8 oz (86.5 kg)  Height: 5\' 9"  (1.753 m)   Body mass index is 28.18 kg/m.   General: Well developed, well nourished. No acute distress. Lungs: Clear to auscultation bilaterally. No wheezing, rales or rhonchi. CV: RRR without murmurs or rubs. Pulses 2+ bilaterally. Chest: Pain on palpation between the ~ the left 7-8th rib. No increased pain with AP compression. Psych: Alert and oriented. Normal mood and affect.  There are no preventive care reminders to  display for this patient.  EKG: Normal sinus rhythm (rate = 75). Poor R-wave progression in V1-V3, but no ST changes or abnormal Q-waves.    Assessment & Plan:   Problem List Items Addressed This Visit   None Visit Diagnoses       Intercostal pain    -  Primary   Appears to be a pulled intercostal muscle. I recommend a 7-day course of naproxen and heat until resolved.   Relevant Medications   naproxen (NAPROSYN) 500 MG tablet   Other Relevant Orders   EKG 12-Lead (Completed)       Return if symptoms worsen or fail to improve.   Loyola Mast, MD

## 2024-01-26 ENCOUNTER — Other Ambulatory Visit: Payer: Self-pay | Admitting: Cardiology

## 2024-01-26 DIAGNOSIS — R55 Syncope and collapse: Secondary | ICD-10-CM

## 2024-01-28 ENCOUNTER — Other Ambulatory Visit (HOSPITAL_COMMUNITY): Payer: Self-pay

## 2024-01-28 MED ORDER — METOPROLOL SUCCINATE ER 25 MG PO TB24
25.0000 mg | ORAL_TABLET | Freq: Every day | ORAL | 1 refills | Status: DC
  Filled 2024-01-28: qty 90, 90d supply, fill #0
  Filled 2024-04-27: qty 90, 90d supply, fill #1

## 2024-03-27 ENCOUNTER — Other Ambulatory Visit: Payer: Self-pay | Admitting: Family Medicine

## 2024-03-27 DIAGNOSIS — G2581 Restless legs syndrome: Secondary | ICD-10-CM

## 2024-03-27 MED ORDER — ROPINIROLE HCL 2 MG PO TABS
2.0000 mg | ORAL_TABLET | Freq: Every day | ORAL | 3 refills | Status: AC
  Filled 2024-03-27: qty 90, 90d supply, fill #0
  Filled 2024-06-19: qty 90, 90d supply, fill #1
  Filled 2024-09-18: qty 90, 90d supply, fill #2
  Filled 2024-12-11: qty 90, 90d supply, fill #3

## 2024-03-28 ENCOUNTER — Other Ambulatory Visit: Payer: Self-pay

## 2024-04-27 ENCOUNTER — Other Ambulatory Visit (HOSPITAL_COMMUNITY): Payer: Self-pay

## 2024-04-29 ENCOUNTER — Encounter: Admitting: Family Medicine

## 2024-05-02 ENCOUNTER — Telehealth: Admitting: Physician Assistant

## 2024-05-02 ENCOUNTER — Encounter: Payer: Self-pay | Admitting: Family Medicine

## 2024-05-02 DIAGNOSIS — B9689 Other specified bacterial agents as the cause of diseases classified elsewhere: Secondary | ICD-10-CM | POA: Diagnosis not present

## 2024-05-02 DIAGNOSIS — J019 Acute sinusitis, unspecified: Secondary | ICD-10-CM

## 2024-05-02 MED ORDER — AMOXICILLIN-POT CLAVULANATE 875-125 MG PO TABS
1.0000 | ORAL_TABLET | Freq: Two times a day (BID) | ORAL | 0 refills | Status: DC
Start: 2024-05-02 — End: 2024-07-04

## 2024-05-02 MED ORDER — FLUTICASONE PROPIONATE 50 MCG/ACT NA SUSP
2.0000 | Freq: Every day | NASAL | 0 refills | Status: AC
Start: 2024-05-02 — End: ?

## 2024-05-02 NOTE — Progress Notes (Signed)
 Virtual Visit Consent   Jonathan Rivas, you are scheduled for a virtual visit with a [facility] provider today. Just as with appointments in the office, your consent must be obtained to participate. Your consent will be active for this visit and any virtual visit you may have with one of our providers in the next 365 days. If you have a MyChart account, a copy of this consent can be sent to you electronically.  As this is a virtual visit, video technology does not allow for your provider to perform a traditional examination. This may limit your provider's ability to fully assess your condition. If your provider identifies any concerns that need to be evaluated in person or the need to arrange testing (such as labs, EKG, etc.), we will make arrangements to do so. Although advances in technology are sophisticated, we cannot ensure that it will always work on either your end or our end. If the connection with a video visit is poor, the visit may have to be switched to a telephone visit. With either a video or telephone visit, we are not always able to ensure that we have a secure connection.  By engaging in this virtual visit, you consent to the provision of healthcare and authorize for your insurance to be billed (if applicable) for the services provided during this visit. Depending on your insurance coverage, you may receive a charge related to this service.  I need to obtain your verbal consent now. Are you willing to proceed with your visit today? Jonathan Rivas has provided verbal consent on 05/02/2024 for a virtual visit (video or telephone). Angelia Kelp, PA-C  Date: 05/02/2024 7:51 PM   Virtual Visit via Video Note   I, Angelia Kelp, connected with  Jonathan Rivas  ([id number], 1971-07-01) on 05/02/24 at  7:45 PM EDT by a video-enabled telemedicine application and verified that I am speaking with the correct person using two identifiers.  Location: Patient: Virtual Visit  Location Patient: Home Provider: Virtual Visit Location Provider: Home Office   I discussed the limitations of evaluation and management by telemedicine and the availability of in person appointments. The patient expressed understanding and agreed to proceed.    History of Present Illness: Jonathan Rivas is a 53 y.o. who identifies as a male who was assigned male at birth, and is being seen today for sinus congestion and pain.  HPI: Sinusitis This is a new problem. The current episode started 1 to 4 weeks ago (2 weeks, worsened over the last couple of days). The problem has been gradually worsening since onset. There has been no fever. The pain is moderate. Associated symptoms include congestion, coughing (from drainage), ear pain (intermittent), headaches, sinus pressure (post nasal drainage) and a sore throat (mild from drainage). Pertinent negatives include no chills, diaphoresis, hoarse voice, shortness of breath, sneezing or swollen glands. Treatments tried: tylenol  cold and sinus, dayquil. The treatment provided no relief.      Problems:  Patient Active Problem List   Diagnosis Date Noted   Syncope 06/12/2023   Allergic rhinitis    S/P laparoscopic sleeve gastrectomy 11/28/2019   Major depression in complete remission (HCC) 04/10/2015   Restless leg syndrome 04/10/2015   Tobacco use disorder 04/10/2015    Allergies:  Allergies  Allergen Reactions   Nsaids Other (See Comments)    Hx of gastric bypass    Medications:  Current Outpatient Medications:    amoxicillin -clavulanate (AUGMENTIN ) 875-125 MG tablet, Take 1 tablet  by mouth 2 (two) times daily., Disp: 14 tablet, Rfl: 0   fluticasone  (FLONASE ) 50 MCG/ACT nasal spray, Place 2 sprays into both nostrils daily., Disp: 16 g, Rfl: 0   Calcium  500-2.5 MG-MCG CHEW, Chew 1 tablet by mouth in the morning, at noon, and at bedtime., Disp: , Rfl:    loratadine  (CLARITIN ) 10 MG tablet, Take 10 mg by mouth at bedtime., Disp: , Rfl:     metoprolol  succinate (TOPROL -XL) 25 MG 24 hr tablet, Take 1 tablet (25 mg total) by mouth daily., Disp: 90 tablet, Rfl: 1   Multiple Vitamins-Minerals (BARIATRIC MULTIVITAMINS/IRON) CAPS, Take 1 capsule by mouth daily., Disp: , Rfl:    naproxen  (NAPROSYN ) 500 MG tablet, Take 1 tablet (500 mg total) by mouth 2 (two) times daily with a meal., Disp: 14 tablet, Rfl: 0   rOPINIRole  (REQUIP ) 2 MG tablet, Take 1 tablet (2 mg total) by mouth at bedtime., Disp: 90 tablet, Rfl: 3  Observations/Objective: Patient is well-developed, well-nourished in no acute distress.  Resting comfortably at home.  Head is normocephalic, atraumatic.  No labored breathing.  Speech is clear and coherent with logical content.  Patient is alert and oriented at baseline.    Assessment and Plan: 1. Acute bacterial sinusitis (Primary) - amoxicillin -clavulanate (AUGMENTIN ) 875-125 MG tablet; Take 1 tablet by mouth 2 (two) times daily.  Dispense: 14 tablet; Refill: 0 - fluticasone  (FLONASE ) 50 MCG/ACT nasal spray; Place 2 sprays into both nostrils daily.  Dispense: 16 g; Refill: 0  - Worsening symptoms that have not responded to OTC medications.  - Will give Augmentin  and Flonase  - Continue allergy medications.  - Steam and humidifier can help - Stay well hydrated and get plenty of rest.  - Seek in person evaluation if no symptom improvement or if symptoms worsen   Follow Up Instructions: I discussed the assessment and treatment plan with the patient. The patient was provided an opportunity to ask questions and all were answered. The patient agreed with the plan and demonstrated an understanding of the instructions.  A copy of instructions were sent to the patient via MyChart unless otherwise noted below.    The patient was advised to call back or seek an in-person evaluation if the symptoms worsen or if the condition fails to improve as anticipated.    Angelia Kelp, PA-C

## 2024-05-02 NOTE — Patient Instructions (Signed)
 Jonathan Rivas, thank you for joining Angelia Kelp, PA-C for today's virtual visit.  While this provider is not your primary care provider (PCP), if your PCP is located in our provider database this encounter information will be shared with them immediately following your visit.   A [facility] MyChart account gives you access to today's visit and all your visits, tests, and labs performed at [facility] " click here if you don't have a [facility] MyChart account or go to [email or web address]  Consent: (Patient) Jonathan Rivas provided verbal consent for this virtual visit at the beginning of the encounter.  Current Medications:  Current Outpatient Medications:    amoxicillin -clavulanate (AUGMENTIN ) 875-125 MG tablet, Take 1 tablet by mouth 2 (two) times daily., Disp: 14 tablet, Rfl: 0   fluticasone  (FLONASE ) 50 MCG/ACT nasal spray, Place 2 sprays into both nostrils daily., Disp: 16 g, Rfl: 0   Calcium  500-2.5 MG-MCG CHEW, Chew 1 tablet by mouth in the morning, at noon, and at bedtime., Disp: , Rfl:    loratadine  (CLARITIN ) 10 MG tablet, Take 10 mg by mouth at bedtime., Disp: , Rfl:    metoprolol  succinate (TOPROL -XL) 25 MG 24 hr tablet, Take 1 tablet (25 mg total) by mouth daily., Disp: 90 tablet, Rfl: 1   Multiple Vitamins-Minerals (BARIATRIC MULTIVITAMINS/IRON) CAPS, Take 1 capsule by mouth daily., Disp: , Rfl:    naproxen  (NAPROSYN ) 500 MG tablet, Take 1 tablet (500 mg total) by mouth 2 (two) times daily with a meal., Disp: 14 tablet, Rfl: 0   rOPINIRole  (REQUIP ) 2 MG tablet, Take 1 tablet (2 mg total) by mouth at bedtime., Disp: 90 tablet, Rfl: 3   Medications ordered in this encounter:  Meds ordered this encounter  Medications   amoxicillin -clavulanate (AUGMENTIN ) 875-125 MG tablet    Sig: Take 1 tablet by mouth 2 (two) times daily.    Dispense:  14 tablet    Refill:  0    Supervising Provider:   LAMPTEY, PHILIP O [[id number]]   fluticasone  (FLONASE ) 50  MCG/ACT nasal spray    Sig: Place 2 sprays into both nostrils daily.    Dispense:  16 g    Refill:  0    Supervising Provider:   Corine Dice [[id number]]     *If you need refills on other medications prior to your next appointment, please contact your pharmacy*  Follow-Up: Call back or seek an in-person evaluation if the symptoms worsen or if the condition fails to improve as anticipated.  [facility] [phone]  Other Instructions Sinus Infection, Adult A sinus infection, also called sinusitis, is inflammation of your sinuses. Sinuses are hollow spaces in the bones around your face. Your sinuses are located: Around your eyes. In the middle of your forehead. Behind your nose. In your cheekbones. Mucus normally drains out of your sinuses. When your nasal tissues become inflamed or swollen, mucus can become trapped or blocked. This allows bacteria, viruses, and fungi to grow, which leads to infection. Most infections of the sinuses are caused by a virus. A sinus infection can develop quickly. It can last for up to 4 weeks (acute) or for more than 12 weeks (chronic). A sinus infection often develops after a cold. What are the causes? This condition is caused by anything that creates swelling in the sinuses or stops mucus from draining. This includes: Allergies. Asthma. Infection from bacteria or viruses. Deformities or blockages in your nose or sinuses. Abnormal growths in the nose (nasal  polyps). Pollutants, such as chemicals or irritants in the air. Infection from fungi. This is rare. What increases the risk? You are more likely to develop this condition if you: Have a weak body defense system (immune system). Do a lot of swimming or diving. Overuse nasal sprays. Smoke. What are the signs or symptoms? The main symptoms of this condition are pain and a feeling of pressure around the affected sinuses. Other symptoms include: Stuffy nose or congestion that  makes it difficult to breathe through your nose. Thick yellow or greenish drainage from your nose. Tenderness, swelling, and warmth over the affected sinuses. A cough that may get worse at night. Decreased sense of smell and taste. Extra mucus that collects in the throat or the back of the nose (postnasal drip) causing a sore throat or bad breath. Tiredness (fatigue). Fever. How is this diagnosed? This condition is diagnosed based on: Your symptoms. Your medical history. A physical exam. Tests to find out if your condition is acute or chronic. This may include: Checking your nose for nasal polyps. Viewing your sinuses using a device that has a light (endoscope). Testing for allergies or bacteria. Imaging tests, such as an MRI or CT scan. In rare cases, a bone biopsy may be done to rule out more serious types of fungal sinus disease. How is this treated? Treatment for a sinus infection depends on the cause and whether your condition is chronic or acute. If caused by a virus, your symptoms should go away on their own within 10 days. You may be given medicines to relieve symptoms. They include: Medicines that shrink swollen nasal passages (decongestants). A spray that eases inflammation of the nostrils (topical intranasal corticosteroids). Rinses that help get rid of thick mucus in your nose (nasal saline washes). Medicines that treat allergies (antihistamines). Over-the-counter pain relievers. If caused by bacteria, your health care provider may recommend waiting to see if your symptoms improve. Most bacterial infections will get better without antibiotic medicine. You may be given antibiotics if you have: A severe infection. A weak immune system. If caused by narrow nasal passages or nasal polyps, surgery may be needed. Follow these instructions at home: Medicines Take, use, or apply over-the-counter and prescription medicines only as told by your health care provider. These may  include nasal sprays. If you were prescribed an antibiotic medicine, take it as told by your health care provider. Do not stop taking the antibiotic even if you start to feel better. Hydrate and humidify  Drink enough fluid to keep your urine pale yellow. Staying hydrated will help to thin your mucus. Use a cool mist humidifier to keep the humidity level in your home above 50%. Inhale steam for 10-15 minutes, 3-4 times a day, or as told by your health care provider. You can do this in the bathroom while a hot shower is running. Limit your exposure to cool or dry air. Rest Rest as much as possible. Sleep with your head raised (elevated). Make sure you get enough sleep each night. General instructions  Apply a warm, moist washcloth to your face 3-4 times a day or as told by your health care provider. This will help with discomfort. Use nasal saline washes as often as told by your health care provider. Wash your hands often with soap and water to reduce your exposure to germs. If soap and water are not available, use hand sanitizer. Do not smoke. Avoid being around people who are smoking (secondhand smoke). Keep all follow-up visits. This  is important. Contact a health care provider if: You have a fever. Your symptoms get worse. Your symptoms do not improve within 10 days. Get help right away if: You have a severe headache. You have persistent vomiting. You have severe pain or swelling around your face or eyes. You have vision problems. You develop confusion. Your neck is stiff. You have trouble breathing. These symptoms may be an emergency. Get help right away. Call 911. Do not wait to see if the symptoms will go away. Do not drive yourself to the hospital. Summary A sinus infection is soreness and inflammation of your sinuses. Sinuses are hollow spaces in the bones around your face. This condition is caused by nasal tissues that become inflamed or swollen. The swelling traps or  blocks the flow of mucus. This allows bacteria, viruses, and fungi to grow, which leads to infection. If you were prescribed an antibiotic medicine, take it as told by your health care provider. Do not stop taking the antibiotic even if you start to feel better. Keep all follow-up visits. This is important. This information is not intended to replace advice given to you by your health care provider. Make sure you discuss any questions you have with your health care provider. Document Revised: 10/15/2021 Document Reviewed: 10/15/2021 Elsevier Patient Education  2024 [employer]   If you have been instructed to have an in-person evaluation today at a local Urgent Care facility, please use the link below. It will take you to a list of all of our available [facility], including address, phone number and hours of operation. Please do not delay care.  [facility]  If you or a family member do not have a primary care provider, use the link below to schedule a visit and establish care. When you choose a [facility] primary care physician or advanced practice provider, you gain a long-term partner in health. Find a Primary Care Provider  Learn more about [facility]'s in-office and virtual care options: [facility] - Get Care Now

## 2024-06-20 ENCOUNTER — Other Ambulatory Visit: Payer: Self-pay

## 2024-06-21 ENCOUNTER — Encounter (HOSPITAL_COMMUNITY): Payer: Self-pay | Admitting: *Deleted

## 2024-07-04 ENCOUNTER — Ambulatory Visit: Payer: Self-pay | Admitting: Family Medicine

## 2024-07-04 ENCOUNTER — Encounter: Payer: Self-pay | Admitting: Family Medicine

## 2024-07-04 ENCOUNTER — Ambulatory Visit (INDEPENDENT_AMBULATORY_CARE_PROVIDER_SITE_OTHER): Admitting: Family Medicine

## 2024-07-04 ENCOUNTER — Other Ambulatory Visit: Payer: Self-pay

## 2024-07-04 ENCOUNTER — Other Ambulatory Visit (HOSPITAL_COMMUNITY): Payer: Self-pay

## 2024-07-04 VITALS — BP 120/70 | HR 55 | Temp 97.6°F | Ht 69.0 in | Wt 196.6 lb

## 2024-07-04 DIAGNOSIS — Z0000 Encounter for general adult medical examination without abnormal findings: Secondary | ICD-10-CM | POA: Diagnosis not present

## 2024-07-04 DIAGNOSIS — F3342 Major depressive disorder, recurrent, in full remission: Secondary | ICD-10-CM

## 2024-07-04 DIAGNOSIS — Z9884 Bariatric surgery status: Secondary | ICD-10-CM | POA: Diagnosis not present

## 2024-07-04 DIAGNOSIS — F339 Major depressive disorder, recurrent, unspecified: Secondary | ICD-10-CM | POA: Diagnosis not present

## 2024-07-04 DIAGNOSIS — G2581 Restless legs syndrome: Secondary | ICD-10-CM | POA: Diagnosis not present

## 2024-07-04 DIAGNOSIS — J309 Allergic rhinitis, unspecified: Secondary | ICD-10-CM | POA: Diagnosis not present

## 2024-07-04 LAB — COMPREHENSIVE METABOLIC PANEL WITH GFR
ALT: 16 U/L (ref 0–53)
AST: 17 U/L (ref 0–37)
Albumin: 4.3 g/dL (ref 3.5–5.2)
Alkaline Phosphatase: 48 U/L (ref 39–117)
BUN: 10 mg/dL (ref 6–23)
CO2: 31 meq/L (ref 19–32)
Calcium: 9.7 mg/dL (ref 8.4–10.5)
Chloride: 103 meq/L (ref 96–112)
Creatinine, Ser: 0.65 mg/dL (ref 0.40–1.50)
GFR: 107.79 mL/min (ref >60.00)
Glucose, Bld: 85 mg/dL (ref 70–99)
Potassium: 4.4 meq/L (ref 3.5–5.1)
Sodium: 141 meq/L (ref 135–145)
Total Bilirubin: 0.7 mg/dL (ref 0.2–1.2)
Total Protein: 6.6 g/dL (ref 6.0–8.3)

## 2024-07-04 LAB — CBC
HCT: 41.1 % (ref 39.0–52.0)
Hemoglobin: 13.7 g/dL (ref 13.0–17.0)
MCHC: 33.4 g/dL (ref 30.0–36.0)
MCV: 92.5 fl (ref 78.0–100.0)
Platelets: 195 K/uL (ref 150.0–400.0)
RBC: 4.44 Mil/uL (ref 4.22–5.81)
RDW: 14 % (ref 11.5–15.5)
WBC: 7.9 K/uL (ref 4.0–10.5)

## 2024-07-04 LAB — VITAMIN D 25 HYDROXY (VIT D DEFICIENCY, FRACTURES): VITD: 50.96 ng/mL (ref 30.00–100.00)

## 2024-07-04 LAB — FOLATE: Folate: >23.4 ng/mL (ref >5.9)

## 2024-07-04 LAB — VITAMIN B12: Vitamin B-12: 538 pg/mL (ref 211–911)

## 2024-07-04 MED ORDER — VENLAFAXINE HCL ER 37.5 MG PO CP24
37.5000 mg | ORAL_CAPSULE | Freq: Every day | ORAL | 3 refills | Status: DC
  Filled 2024-07-04: qty 30, 30d supply, fill #0
  Filled 2024-07-30: qty 30, 30d supply, fill #1
  Filled 2024-09-01: qty 30, 30d supply, fill #2
  Filled 2024-09-30: qty 30, 30d supply, fill #3

## 2024-07-04 NOTE — Assessment & Plan Note (Signed)
Continue ropinirole 2 mg at bedtime.

## 2024-07-04 NOTE — Assessment & Plan Note (Signed)
 Recommend he try using his nasal steroid spray daily with his Claritin  as needed.

## 2024-07-04 NOTE — Assessment & Plan Note (Signed)
Lab results are all normal. I recommend he continue his daily supplements.

## 2024-07-04 NOTE — Assessment & Plan Note (Signed)
 Overall health is good. Recommend he return to regular exercise and keep an eye on his weight gain. Discussed recommended screenings and immunizations.

## 2024-07-04 NOTE — Assessment & Plan Note (Signed)
 Jonathan Rivas is noting a return of depressive symptoms since he has been off of his medication. I will start him back on venlafaxine  37.5 mg

## 2024-07-04 NOTE — Progress Notes (Signed)
 [facility]ARE [facility] [address] [place] [address] Dept: [phone] Dept Fax: [phone]  Annual Physical Visit  Subjective:    Patient ID: Jonathan Rivas, male    DOB: 1971-08-31, 53 y.o..   MRN: [id number]  Chief Complaint  Patient presents with   Annual Exam    CPE/labs.  No concerns.  Fasting today.     History of Present Illness:  Patient is in today for an annual physical/preventative visit.  Mr. Jonathan Rivas has a history of prior sleeve gastrectomy. He continues to take a daily vitamin. He has not been able to keep up with regular exercise the way he might want.  Mr. Jonathan Rivas has a history of restless leg syndrome. He finds ropinirole  2 mg at bedtime works quite well for helping to manage these symptoms.  Review of Systems  Constitutional:  Negative for chills, diaphoresis, fever, malaise/fatigue and weight loss.  HENT:  Positive for hearing loss. Negative for congestion, ear pain, sinus pain, sore throat and tinnitus.        Chronic hearing loss in left ear sense childhood.  Intermittent allergy symptoms not fully controlled with loratadine  use. Uses nasal steroid spray intermittently.  Eyes:  Negative for blurred vision, pain, discharge and redness.  Respiratory:  Negative for cough, shortness of breath and wheezing.   Cardiovascular:  Negative for chest pain and palpitations.  Gastrointestinal:  Negative for abdominal pain, constipation, diarrhea, heartburn, nausea and vomiting.  Musculoskeletal:  Positive for joint pain. Negative for back pain and myalgias.       Chronic pain in right foot s/p prior foot surgery. Typically ignores the issue.  Skin:  Negative for itching and rash.  Psychiatric/Behavioral:  Positive for depression. The patient is not nervous/anxious.        Previously treated for depression with Effexor . He had stopped this, as he was doing well. Now, he notes gradually progressive depressive symptoms  and irritability. He doesn't like how this is making him feel and wonders about going back on medicine.   Past Medical History: Patient Active Problem List   Diagnosis Date Noted   Syncope 06/12/2023   Allergic rhinitis    S/P laparoscopic sleeve gastrectomy 11/28/2019   Major depression in complete remission (HCC) 04/10/2015   Restless leg syndrome 04/10/2015   Tobacco use disorder 04/10/2015   Past Surgical History:  Procedure Laterality Date   BUNIONECTOMY Right 2023   CARDIAC CATHETERIZATION N/A 10/03/2016   No CAD: Procedure: Left Heart Cath and Coronary Angiography;  Surgeon: Debby DELENA Sor, MD;  [facility]: [facility];  Service: Cardiovascular;  Laterality: N/A;   CHOLECYSTECTOMY N/A 09/23/2022   Procedure: LAPAROSCOPIC CHOLECYSTECTOMY WITH INTRAOPERATIVE CHOLANGIOGRAM;  Surgeon: Lyndel Deward PARAS, MD;  Location: [facility];  Service: General;  Laterality: N/A;   COLONOSCOPY  06/07/2021   CYSTOSCOPY     Hematuria workup.  No recalled significant findings   LAPAROSCOPIC GASTRIC SLEEVE RESECTION N/A 11/28/2019   Procedure: LAPAROSCOPIC GASTRIC SLEEVE RESECTION, UPPER Endo, Eras Pathway;  Surgeon: Gladis Cough, MD;  Location: [facility];  Service: General;  Laterality: N/A;   wisdom teeth     Family History  Problem Relation Age of Onset   Cancer Mother        Leukemia   Heart disease Father        Age 52's   Depression Brother    Alcohol abuse Brother    Drug abuse Brother    Asthma Maternal Aunt  Alcohol abuse Maternal Aunt    Heart disease Paternal Uncle    Heart disease Paternal Grandfather    Colon cancer Neg Hx    Colon polyps Neg Hx    Esophageal cancer Neg Hx    Stomach cancer Neg Hx    Rectal cancer Neg Hx    Outpatient Medications Prior to Visit  Medication Sig Dispense Refill   Calcium  500-2.5 MG-MCG CHEW Chew 1 tablet by mouth in the morning, at noon, and at bedtime.     fluticasone  (FLONASE ) 50 MCG/ACT nasal spray Place 2 sprays into both  nostrils daily. 16 g 0   loratadine  (CLARITIN ) 10 MG tablet Take 10 mg by mouth at bedtime.     metoprolol  succinate (TOPROL -XL) 25 MG 24 hr tablet Take 1 tablet (25 mg total) by mouth daily. 90 tablet 1   Multiple Vitamins-Minerals (BARIATRIC MULTIVITAMINS/IRON) CAPS Take 1 capsule by mouth daily.     rOPINIRole  (REQUIP ) 2 MG tablet Take 1 tablet (2 mg total) by mouth at bedtime. 90 tablet 3   amoxicillin -clavulanate (AUGMENTIN ) 875-125 MG tablet Take 1 tablet by mouth 2 (two) times daily. 14 tablet 0   naproxen  (NAPROSYN ) 500 MG tablet Take 1 tablet (500 mg total) by mouth 2 (two) times daily with a meal. 14 tablet 0   No facility-administered medications prior to visit.   Allergies  Allergen Reactions   Nsaids Other (See Comments)    Hx of gastric bypass    Objective:   Today's Vitals   07/04/24 1311  BP: 120/70  Pulse: (!) 55  Temp: 97.6 F (36.4 C)  TempSrc: Temporal  SpO2: 97%  Weight: 196 lb 9.6 oz (89.2 kg)  Height: 5' 9 (1.753 m)   Body mass index is 29.03 kg/m.   General: Well developed, well nourished. No acute distress. HEENT: Normocephalic, non-traumatic. PERRL, EOMI. Conjunctiva clear. External ears normal. EAC and TMs normal   bilaterally. Nose clear without congestion or rhinorrhea. Mucous membranes moist. Oropharynx clear. Good dentition. Neck: Supple. No lymphadenopathy. No thyromegaly. Lungs: Clear to auscultation bilaterally. No wheezing, rales or rhonchi. CV: RRR without murmurs or rubs. Pulses 2+ bilaterally. Abdomen: Soft, non-tender. Bowel sounds positive, normal pitch and frequency. No hepatosplenomegaly. No rebound or   guarding. Extremities: Full ROM. No joint swelling or tenderness. No edema noted. Skin: Warm and dry. No rashes. Psych: Alert and oriented. Normal mood and affect.  Health Maintenance Due  Topic Date Due   Hepatitis B Vaccines (1 of 3 - 19+ 3-dose series) Never done   INFLUENZA VACCINE  06/24/2024     Assessment & Plan:    Problem List Items Addressed This Visit       Respiratory   Allergic rhinitis   Recommend he try using his nasal steroid spray daily with his Claritin  as needed.        Other   Annual physical exam - Primary   Overall health is good. Recommend he return to regular exercise and keep an eye on his weight gain. Discussed recommended screenings and immunizations.       Depression, recurrent [facility])   Mr. Kon is noting a return of depressive symptoms since he has been off of his medication. I will start him back on venlafaxine  37.5 mg       Relevant Medications   venlafaxine  XR (EFFEXOR  XR) 37.5 MG 24 hr capsule   Restless leg syndrome   Continue ropinirole  2 mg at bedtime.      S/P laparoscopic sleeve gastrectomy  Lab results are all normal. I recommend he continue his daily supplements.      Relevant Orders   CBC   Comprehensive metabolic panel with GFR   VITAMIN D  25 Hydroxy (Vit-D Deficiency, Fractures)   Vitamin B12   Folate    Return in about 6 weeks (around 08/15/2024) for Reassessment.   [name], MD

## 2024-07-24 ENCOUNTER — Other Ambulatory Visit: Payer: Self-pay | Admitting: Cardiology

## 2024-07-24 DIAGNOSIS — R55 Syncope and collapse: Secondary | ICD-10-CM

## 2024-07-26 ENCOUNTER — Other Ambulatory Visit: Payer: Self-pay

## 2024-07-26 ENCOUNTER — Other Ambulatory Visit (HOSPITAL_COMMUNITY): Payer: Self-pay

## 2024-07-26 MED ORDER — METOPROLOL SUCCINATE ER 25 MG PO TB24
25.0000 mg | ORAL_TABLET | Freq: Every day | ORAL | 0 refills | Status: DC
  Filled 2024-07-26: qty 30, 30d supply, fill #0

## 2024-07-30 ENCOUNTER — Other Ambulatory Visit (HOSPITAL_COMMUNITY): Payer: Self-pay

## 2024-08-19 ENCOUNTER — Other Ambulatory Visit: Payer: Self-pay

## 2024-08-19 ENCOUNTER — Other Ambulatory Visit (HOSPITAL_COMMUNITY): Payer: Self-pay

## 2024-08-19 ENCOUNTER — Other Ambulatory Visit: Payer: Self-pay | Admitting: Cardiology

## 2024-08-19 DIAGNOSIS — R55 Syncope and collapse: Secondary | ICD-10-CM

## 2024-08-19 MED ORDER — METOPROLOL SUCCINATE ER 25 MG PO TB24
25.0000 mg | ORAL_TABLET | Freq: Every day | ORAL | 0 refills | Status: AC
Start: 2024-08-19 — End: ?
  Filled 2024-08-19: qty 15, 15d supply, fill #0

## 2024-08-22 ENCOUNTER — Ambulatory Visit: Attending: Cardiology | Admitting: Cardiology

## 2024-08-22 ENCOUNTER — Encounter: Payer: Self-pay | Admitting: Cardiology

## 2024-08-22 VITALS — BP 112/69 | HR 62 | Resp 16 | Ht 69.0 in | Wt 184.0 lb

## 2024-08-22 DIAGNOSIS — R55 Syncope and collapse: Secondary | ICD-10-CM

## 2024-08-22 DIAGNOSIS — Z9884 Bariatric surgery status: Secondary | ICD-10-CM | POA: Diagnosis not present

## 2024-08-22 DIAGNOSIS — F17200 Nicotine dependence, unspecified, uncomplicated: Secondary | ICD-10-CM

## 2024-08-22 NOTE — Progress Notes (Signed)
 Cardiology Office Note:  .   Date:  08/22/2024  ID:  Jonathan Rivas, DOB [birth year], MRN [id number] PCP: Jonathan Garnette HERO, MD  [facility] Providers Cardiologist:  Jonathan Bergamo, MD   History of Present Illness: Jonathan   Jonathan Rivas is a 53 y.o. morbid obesity SP gastric sleeve surgery in 2021 restless leg syndrome, presenting with syncope to the emergency room on 06/11/2023, felt to be vasovagal episode.   Cardiac Studies relevent.    Left Heart Catheterization 10/03/2016:  Normal coronary arteries.  Right dominant circulation.  Normal EDP.        Zio Patch Extended out patient EKG monitoring 13 days starting 06/12/2023: Predominant Rhythm : Normal sinus rhythm. Min HR: 46 bpm at 3:14 AM. Max HR 132 bpm at 3:41 PM Atrial arrhythmias: There were 7 brief atrial tachycardia episodes longest 11 beats at rate of 164 bpm.  1 wide-complex rhythm of 4 beats probably AT with aberrancy. Atrial fibrillation: None Ventricular arrhythmias: 4 beat wide-complex rhythm probably AT with aberrancy.  Rare PVCs and ventricular couplets and triplets constituting <1% each. PVC Burden <1% overall. Heart Block: None  Symptoms: No symptoms reported.    Discussed the use of AI scribe software for clinical note transcription with the patient, who gave verbal consent to proceed.  History of Present Illness Jonathan Rivas is a 53 year old male who presents for a routine follow-up visit.  He experienced a vasovagal syncope episode in July 2024 and has been on metoprolol  succinate, 25 mg daily, since then without further episodes. He is concerned about the recurrence of syncope if the medication is discontinued.  He smokes half a pack of cigarettes per day, having reduced from a higher amount, and is gradually working towards cessation.  He underwent a heart catheterization in November 2017, which showed no blockages.  Labs   Lab Results  Component Value Date   CHOL 147 06/12/2023   HDL  58 06/12/2023   LDLCALC 76 06/12/2023   TRIG 63 06/12/2023   CHOLHDL 2.5 06/12/2023   No results found for: LIPOA  Recent Labs    07/04/24 1356  NA 141  K 4.4  CL 103  CO2 31  GLUCOSE 85  BUN 10  CREATININE 0.65  CALCIUM  9.7    Lab Results  Component Value Date   ALT 16 07/04/2024   AST 17 07/04/2024   ALKPHOS 48 07/04/2024   BILITOT 0.7 07/04/2024      Latest Ref Rng & Units 07/04/2024    1:56 PM 06/12/2023    6:07 AM 06/11/2023    9:18 PM  CBC  WBC 4.0 - 10.5 K/uL 7.9  12.1  14.1   Hemoglobin 13.0 - 17.0 g/dL 86.2  86.8  86.2   Hematocrit 39.0 - 52.0 % 41.1  40.2  41.6   Platelets 150.0 - 400.0 K/uL 195.0  181  190    Lab Results  Component Value Date   HGBA1C 5.4 06/12/2023    Lab Results  Component Value Date   TSH 0.539 06/12/2023    ROS  Review of Systems  Cardiovascular:  Negative for chest pain, dyspnea on exertion and leg swelling.   Physical Exam:   VS:  BP 112/69 (BP Location: Left Arm, Patient Position: Sitting, Cuff Size: Normal)   Pulse 62   Resp 16   Ht 5' 9 (1.753 m)   Wt 184 lb (83.5 kg)   SpO2 97%   BMI 27.17 kg/m  Wt Readings from Last 3 Encounters:  08/22/24 184 lb (83.5 kg)  07/04/24 196 lb 9.6 oz (89.2 kg)  01/19/24 190 lb 12.8 oz (86.5 kg)    BP Readings from Last 3 Encounters:  08/22/24 112/69  07/04/24 120/70  01/19/24 124/76   Physical Exam Neck:     Vascular: No carotid bruit or JVD.  Cardiovascular:     Rate and Rhythm: Normal rate and regular rhythm.     Pulses: Intact distal pulses.     Heart sounds: Normal heart sounds. No murmur heard.    No gallop.  Pulmonary:     Effort: Pulmonary effort is normal.     Breath sounds: Normal breath sounds.  Abdominal:     General: Bowel sounds are normal.     Palpations: Abdomen is soft.  Musculoskeletal:     Right lower leg: No edema.     Left lower leg: No edema.    EKG:         ASSESSMENT AND PLAN: .      ICD-10-CM   1. Vasovagal syncope  R55     2.  Tobacco use disorder  F17.200     3. S/P laparoscopic sleeve gastrectomy  Z98.84      Assessment & Plan Vasovagal syncope He experienced an episode in July 2024 and has been on a small dose of beta blocker since then with no further syncope. Current examination and labs are normal, and previous heart catheterization in 2017 showed no blockages. - Discontinue beta blocker therapy with a tapering plan. - Instruct to take half of metoprolol  succinate 25 mg for one week, then stop. - Advise on counterpressure maneuvers and positional changes if feeling faint.  Nicotine  dependence He smokes approximately half a pack per day, reduced from previous levels, but has not quit. He acknowledges the need to stop smoking and is considering setting a quit date by May. - Encourage setting a quit date for smoking cessation, potentially by May on his birthday .  Follow up: PRN  Signed,  Jonathan Bergamo, MD, [facility] 08/22/2024, 8:29 AM [facility] [address] [place], [place] [address] Phone: [phone]. Fax:  [phone]

## 2024-08-22 NOTE — Patient Instructions (Signed)
 Medication Instructions:  No medication changes were made at this visit. Continue current regimen.   *If you need a refill on your cardiac medications before your next appointment, please call your pharmacy*  Lab Work: NONE If you have labs (blood work) drawn today and your tests are completely normal, you will receive your results only by: MyChart Message (if you have MyChart) OR A paper copy in the mail If you have any lab test that is abnormal or we need to change your treatment, we will call you to review the results.  Testing/Procedures: NONE  Follow-Up: At [facility], you and your health needs are our priority.  As part of our continuing mission to provide you with exceptional heart care, our providers are all part of one team.  This team includes your primary Cardiologist (physician) and Advanced Practice Providers or APPs (Physician Assistants and Nurse Practitioners) who all work together to provide you with the care you need, when you need it.  Your next appointment:   AS NEEDED   Provider:   Gordy Bergamo, MD    We recommend signing up for the patient portal called MyChart.  Sign up information is provided on this After Visit Summary.  MyChart is used to connect with patients for Virtual Visits (Telemedicine).  Patients are able to view lab/test results, encounter notes, upcoming appointments, etc.  Non-urgent messages can be sent to your provider as well.   To learn more about what you can do with MyChart, go to [email or web address].

## 2024-09-01 ENCOUNTER — Other Ambulatory Visit (HOSPITAL_COMMUNITY): Payer: Self-pay

## 2024-09-02 ENCOUNTER — Encounter: Payer: Self-pay | Admitting: Family Medicine

## 2024-09-02 ENCOUNTER — Telehealth: Admitting: Family Medicine

## 2024-09-02 VITALS — Ht 69.0 in | Wt 176.0 lb

## 2024-09-02 DIAGNOSIS — F339 Major depressive disorder, recurrent, unspecified: Secondary | ICD-10-CM | POA: Diagnosis not present

## 2024-09-02 DIAGNOSIS — R55 Syncope and collapse: Secondary | ICD-10-CM | POA: Diagnosis not present

## 2024-09-02 NOTE — Assessment & Plan Note (Signed)
 Improved symptoms now that Jonathan Rivas is back on medication. Plan ongoing maintenance therapy with  venlafaxine  37.5 mg daily.

## 2024-09-02 NOTE — Progress Notes (Signed)
 [facility] [address] [place] [address] Dept: [phone] Dept Fax: [phone]  Virtual Video Visit  I connected with Jonathan Rivas on 09/02/24 at  8:20 AM EDT by a video enabled telemedicine application and verified that I am speaking with the correct person using two identifiers.  Location patient: Home Location provider: Clinic Persons participating in the virtual visit: Patient, Provider  I discussed the limitations of evaluation and management by telemedicine and the availability of in person appointments. The patient expressed understanding and agreed to proceed.  Chief Complaint  Patient presents with   Follow-up    2 month f/u.  No concerns.     SUBJECTIVE:  HPI: Jonathan Rivas is a 53 y.o. male who presents for follow-up regarding recurrent depression. Previously treated for depression with venlafaxine . He had stopped this, as he was doing well. At his last visit, he noted gradually progressive depressive symptoms and irritability. We restarted him on venlafaxine  37.5 mg daily. He notes he is feeling much better now. He notes he no longer feels on edge about things. He denies any side effects.  Mr. [name] had seen Dr. Ladona (cardiology) recently regarding his syncope. It was decided to stop his metoprolol . He is currently on a 1/2 tab daily and next week will be stopping this.   Patient Active Problem List   Diagnosis Date Noted   Annual physical exam 07/04/2024   Syncope 06/12/2023   Allergic rhinitis    S/P laparoscopic sleeve gastrectomy 11/28/2019   Depression, recurrent 04/10/2015   Restless leg syndrome 04/10/2015   Tobacco use disorder 04/10/2015   Past Surgical History:  Procedure Laterality Date   BUNIONECTOMY Right 2023   CARDIAC CATHETERIZATION N/A 10/03/2016   No CAD: Procedure: Left Heart Cath and Coronary Angiography;  Surgeon: Debby DELENA Sor, MD;  Location: [facility];   Service: Cardiovascular;  Laterality: N/A;   CHOLECYSTECTOMY N/A 09/23/2022   Procedure: LAPAROSCOPIC CHOLECYSTECTOMY WITH INTRAOPERATIVE CHOLANGIOGRAM;  Surgeon: Lyndel Deward PARAS, MD;  Location: [facility];  Service: General;  Laterality: N/A;   COLONOSCOPY  06/07/2021   CYSTOSCOPY     Hematuria workup.  No recalled significant findings   LAPAROSCOPIC GASTRIC SLEEVE RESECTION N/A 11/28/2019   Procedure: LAPAROSCOPIC GASTRIC SLEEVE RESECTION, UPPER Endo, Eras Pathway;  Surgeon: Gladis Cough, MD;  Location: [facility];  Service: General;  Laterality: N/A;   wisdom teeth     Family History  Problem Relation Age of Onset   Cancer Mother        Leukemia   Heart disease Father        Age 89's   Depression Brother    Alcohol abuse Brother    Drug abuse Brother    Asthma Maternal Aunt    Alcohol abuse Maternal Aunt    Heart disease Paternal Uncle    Heart disease Paternal Grandfather    Colon cancer Neg Hx    Colon polyps Neg Hx    Esophageal cancer Neg Hx    Stomach cancer Neg Hx    Rectal cancer Neg Hx    Social History   Tobacco Use   Smoking status: Every Day    Current packs/day: 0.00    Average packs/day: 0.5 packs/day for 30.0 years (15.0 ttl pk-yrs)    Types: Cigarettes    Start date: 04/27/1988    Last attempt to quit: 04/27/2018    Years since quitting: 6.3   Smokeless tobacco: Never   Tobacco  comments:    Have quit in the past for short periods of time.  Vaping Use   Vaping status: Never Used  Substance Use Topics   Alcohol use: Not Currently   Drug use: No    Current Outpatient Medications:    Calcium  500-2.5 MG-MCG CHEW, Chew 1 tablet by mouth in the morning, at noon, and at bedtime., Disp: , Rfl:    fluticasone  (FLONASE ) 50 MCG/ACT nasal spray, Place 2 sprays into both nostrils daily., Disp: 16 g, Rfl: 0   loratadine  (CLARITIN ) 10 MG tablet, Take 10 mg by mouth at bedtime., Disp: , Rfl:    metoprolol  succinate (TOPROL -XL) 25 MG 24 hr tablet, Take 1 tablet (25 mg  total) by mouth daily. PLEASE SCHEDULE AN APPOINTMENT TO CONTINUE FUTURE REFILLS 2ND ATTEMPT [phone] (Patient taking differently: Take 25 mg by mouth daily.  1/2 tablet daily), Disp: 15 tablet, Rfl: 0   Multiple Vitamins-Minerals (BARIATRIC MULTIVITAMINS/IRON) CAPS, Take 1 capsule by mouth daily., Disp: , Rfl:    rOPINIRole  (REQUIP ) 2 MG tablet, Take 1 tablet (2 mg total) by mouth at bedtime., Disp: 90 tablet, Rfl: 3   venlafaxine  XR (EFFEXOR  XR) 37.5 MG 24 hr capsule, Take 1 capsule (37.5 mg total) by mouth daily with breakfast., Disp: 30 capsule, Rfl: 3 Allergies  Allergen Reactions   Nsaids Other (See Comments)    Hx of gastric bypass    ROS: See pertinent positives and negatives per HPI.  OBSERVATIONS/OBJECTIVE:  VITALS per patient if applicable: Today's Vitals   09/02/24 0805  Weight: 176 lb (79.8 kg)  Height: 5' 9 (1.753 m)   Body mass index is 25.99 kg/m.    GENERAL: Alert and oriented. Appears well and in no acute distress. PSYCH/NEURO: Pleasant and cooperative. No obvious depression or anxiety. Speech and thought processing grossly intact.  ASSESSMENT AND PLAN:  Problem List Items Addressed This Visit       Other   Depression, recurrent - Primary   Syncope     I discussed the assessment and treatment plan with the patient. The patient was provided an opportunity to ask questions and all were answered. The patient agreed with the plan and demonstrated an understanding of the instructions.   The patient was advised to call back or seek an in-person evaluation if the symptoms worsen or if the condition fails to improve as anticipated.  Return in about 6 months (around 03/03/2025) for Reassessment.   Jonathan CHRISTELLA Simpler, MD

## 2024-09-02 NOTE — Assessment & Plan Note (Signed)
 Currently doing well. Tapering his metoprolol  with plans to stop.

## 2024-09-12 DIAGNOSIS — B8809 Other acariasis: Secondary | ICD-10-CM | POA: Diagnosis not present

## 2024-09-12 DIAGNOSIS — H0100B Unspecified blepharitis left eye, upper and lower eyelids: Secondary | ICD-10-CM | POA: Diagnosis not present

## 2024-09-12 DIAGNOSIS — H35361 Drusen (degenerative) of macula, right eye: Secondary | ICD-10-CM | POA: Diagnosis not present

## 2024-09-12 DIAGNOSIS — H0100A Unspecified blepharitis right eye, upper and lower eyelids: Secondary | ICD-10-CM | POA: Diagnosis not present

## 2024-09-12 DIAGNOSIS — H524 Presbyopia: Secondary | ICD-10-CM | POA: Diagnosis not present

## 2024-09-12 LAB — HM DIABETES EYE EXAM

## 2024-09-13 ENCOUNTER — Encounter: Payer: Self-pay | Admitting: Family Medicine

## 2024-09-19 ENCOUNTER — Other Ambulatory Visit (HOSPITAL_COMMUNITY): Payer: Self-pay

## 2024-09-22 ENCOUNTER — Other Ambulatory Visit: Payer: Self-pay | Admitting: Medical Genetics

## 2024-09-22 DIAGNOSIS — Z006 Encounter for examination for normal comparison and control in clinical research program: Secondary | ICD-10-CM

## 2024-09-30 ENCOUNTER — Other Ambulatory Visit (HOSPITAL_COMMUNITY): Payer: Self-pay

## 2024-10-24 ENCOUNTER — Other Ambulatory Visit: Payer: Self-pay | Admitting: Family Medicine

## 2024-10-24 ENCOUNTER — Other Ambulatory Visit: Payer: Self-pay

## 2024-10-24 ENCOUNTER — Other Ambulatory Visit (HOSPITAL_COMMUNITY): Payer: Self-pay

## 2024-10-24 DIAGNOSIS — F3342 Major depressive disorder, recurrent, in full remission: Secondary | ICD-10-CM

## 2024-10-24 MED ORDER — VENLAFAXINE HCL ER 37.5 MG PO CP24
37.5000 mg | ORAL_CAPSULE | Freq: Every day | ORAL | 3 refills | Status: AC
  Filled 2024-10-24: qty 90, 90d supply, fill #0

## 2024-12-12 ENCOUNTER — Other Ambulatory Visit: Payer: Self-pay
